# Patient Record
Sex: Female | Born: 1940 | Race: White | Hispanic: No | Smoking: Never smoker
Health system: Southern US, Community
[De-identification: ages and names within clinical notes are randomized; demographics above are authoritative.]

## PROBLEM LIST (undated history)

## (undated) DIAGNOSIS — N19 Unspecified kidney failure: Secondary | ICD-10-CM

## (undated) DIAGNOSIS — G2581 Restless legs syndrome: Secondary | ICD-10-CM

## (undated) DIAGNOSIS — Z820 Family history of epilepsy and other diseases of the nervous system: Secondary | ICD-10-CM

## (undated) DIAGNOSIS — F419 Anxiety disorder, unspecified: Secondary | ICD-10-CM

## (undated) DIAGNOSIS — L932 Other local lupus erythematosus: Secondary | ICD-10-CM

## (undated) DIAGNOSIS — K219 Gastro-esophageal reflux disease without esophagitis: Secondary | ICD-10-CM

## (undated) DIAGNOSIS — F331 Major depressive disorder, recurrent, moderate: Secondary | ICD-10-CM

## (undated) DIAGNOSIS — M810 Age-related osteoporosis without current pathological fracture: Secondary | ICD-10-CM

## (undated) DIAGNOSIS — E079 Disorder of thyroid, unspecified: Secondary | ICD-10-CM

## (undated) HISTORY — DX: Family history of epilepsy and other diseases of the nervous system: Z82.0

## (undated) HISTORY — DX: Gastro-esophageal reflux disease without esophagitis: K21.9

## (undated) HISTORY — DX: Restless legs syndrome: G25.81

## (undated) HISTORY — DX: Other local lupus erythematosus: L93.2

## (undated) HISTORY — PX: EYE SURGERY: SHX253

## (undated) HISTORY — PX: BREAST SURGERY: SHX581

## (undated) HISTORY — DX: Age-related osteoporosis without current pathological fracture: M81.0

## (undated) HISTORY — DX: Anxiety disorder, unspecified: F41.9

## (undated) HISTORY — DX: Disorder of thyroid, unspecified: E07.9

## (undated) HISTORY — DX: Major depressive disorder, recurrent, moderate: F33.1

## (undated) HISTORY — DX: Unspecified kidney failure: N19

---
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
## 1984-08-20 HISTORY — PX: TOTAL ABDOMINAL HYSTERECTOMY: SHX209

## 1999-07-24 ENCOUNTER — Ambulatory Visit (HOSPITAL_COMMUNITY): Admission: RE | Admit: 1999-07-24 | Discharge: 1999-07-24 | Payer: Self-pay | Admitting: Gastroenterology

## 1999-08-29 ENCOUNTER — Encounter: Admission: RE | Admit: 1999-08-29 | Discharge: 1999-08-29 | Payer: Self-pay | Admitting: Obstetrics and Gynecology

## 1999-08-29 ENCOUNTER — Encounter: Payer: Self-pay | Admitting: Obstetrics and Gynecology

## 2000-09-03 ENCOUNTER — Ambulatory Visit (HOSPITAL_COMMUNITY): Admission: RE | Admit: 2000-09-03 | Discharge: 2000-09-03 | Payer: Self-pay | Admitting: Obstetrics and Gynecology

## 2000-09-03 ENCOUNTER — Encounter: Payer: Self-pay | Admitting: Obstetrics and Gynecology

## 2000-11-05 ENCOUNTER — Other Ambulatory Visit: Admission: RE | Admit: 2000-11-05 | Discharge: 2000-11-05 | Payer: Self-pay | Admitting: Obstetrics and Gynecology

## 2001-06-12 ENCOUNTER — Encounter: Admission: RE | Admit: 2001-06-12 | Discharge: 2001-06-12 | Payer: Self-pay | Admitting: Family Medicine

## 2001-06-12 ENCOUNTER — Encounter: Payer: Self-pay | Admitting: Family Medicine

## 2001-07-18 ENCOUNTER — Encounter: Admission: RE | Admit: 2001-07-18 | Discharge: 2001-07-18 | Payer: Self-pay | Admitting: Family Medicine

## 2001-07-18 ENCOUNTER — Encounter: Payer: Self-pay | Admitting: Family Medicine

## 2001-09-05 ENCOUNTER — Encounter: Payer: Self-pay | Admitting: Family Medicine

## 2001-09-05 ENCOUNTER — Ambulatory Visit (HOSPITAL_COMMUNITY): Admission: RE | Admit: 2001-09-05 | Discharge: 2001-09-05 | Payer: Self-pay | Admitting: Family Medicine

## 2002-09-08 ENCOUNTER — Encounter: Payer: Self-pay | Admitting: Obstetrics and Gynecology

## 2002-09-08 ENCOUNTER — Ambulatory Visit (HOSPITAL_COMMUNITY): Admission: RE | Admit: 2002-09-08 | Discharge: 2002-09-08 | Payer: Self-pay | Admitting: Obstetrics and Gynecology

## 2003-04-21 ENCOUNTER — Encounter: Payer: Self-pay | Admitting: Family Medicine

## 2003-04-21 ENCOUNTER — Encounter: Admission: RE | Admit: 2003-04-21 | Discharge: 2003-04-21 | Payer: Self-pay | Admitting: Family Medicine

## 2003-09-21 ENCOUNTER — Ambulatory Visit (HOSPITAL_COMMUNITY): Admission: RE | Admit: 2003-09-21 | Discharge: 2003-09-21 | Payer: Self-pay | Admitting: Family Medicine

## 2003-10-26 ENCOUNTER — Other Ambulatory Visit: Admission: RE | Admit: 2003-10-26 | Discharge: 2003-10-26 | Payer: Self-pay | Admitting: Obstetrics and Gynecology

## 2004-09-21 ENCOUNTER — Ambulatory Visit (HOSPITAL_COMMUNITY): Admission: RE | Admit: 2004-09-21 | Discharge: 2004-09-21 | Payer: Self-pay | Admitting: Obstetrics and Gynecology

## 2005-10-04 ENCOUNTER — Ambulatory Visit (HOSPITAL_COMMUNITY): Admission: RE | Admit: 2005-10-04 | Discharge: 2005-10-04 | Payer: Self-pay | Admitting: Obstetrics and Gynecology

## 2006-10-07 ENCOUNTER — Ambulatory Visit (HOSPITAL_COMMUNITY): Admission: RE | Admit: 2006-10-07 | Discharge: 2006-10-07 | Payer: Self-pay | Admitting: Obstetrics and Gynecology

## 2006-10-27 ENCOUNTER — Encounter: Admission: RE | Admit: 2006-10-27 | Discharge: 2006-10-27 | Payer: Self-pay | Admitting: Family Medicine

## 2006-11-03 ENCOUNTER — Encounter: Admission: RE | Admit: 2006-11-03 | Discharge: 2006-11-03 | Payer: Self-pay

## 2007-10-13 ENCOUNTER — Ambulatory Visit (HOSPITAL_COMMUNITY): Admission: RE | Admit: 2007-10-13 | Discharge: 2007-10-13 | Payer: Self-pay | Admitting: Family Medicine

## 2008-10-13 ENCOUNTER — Ambulatory Visit (HOSPITAL_COMMUNITY): Admission: RE | Admit: 2008-10-13 | Discharge: 2008-10-13 | Payer: Self-pay | Admitting: Obstetrics and Gynecology

## 2009-07-29 ENCOUNTER — Encounter: Admission: RE | Admit: 2009-07-29 | Discharge: 2009-07-29 | Payer: Self-pay | Admitting: Family Medicine

## 2009-10-20 ENCOUNTER — Ambulatory Visit (HOSPITAL_COMMUNITY): Admission: RE | Admit: 2009-10-20 | Discharge: 2009-10-20 | Payer: Self-pay | Admitting: Obstetrics and Gynecology

## 2010-01-18 LAB — HM COLONOSCOPY

## 2010-01-25 LAB — HM COLONOSCOPY

## 2010-09-09 ENCOUNTER — Encounter: Payer: Self-pay | Admitting: Family Medicine

## 2010-09-10 ENCOUNTER — Encounter: Payer: Self-pay | Admitting: Family Medicine

## 2010-09-15 ENCOUNTER — Other Ambulatory Visit: Payer: Self-pay | Admitting: Obstetrics and Gynecology

## 2010-09-15 DIAGNOSIS — Z1239 Encounter for other screening for malignant neoplasm of breast: Secondary | ICD-10-CM

## 2010-09-15 DIAGNOSIS — Z1231 Encounter for screening mammogram for malignant neoplasm of breast: Secondary | ICD-10-CM

## 2010-10-23 ENCOUNTER — Ambulatory Visit (HOSPITAL_COMMUNITY)
Admission: RE | Admit: 2010-10-23 | Discharge: 2010-10-23 | Disposition: A | Discharge status: Home / Self Care | Payer: Medicare Other | Source: Ambulatory Visit | Attending: Obstetrics and Gynecology | Admitting: Obstetrics and Gynecology

## 2010-10-23 DIAGNOSIS — Z1231 Encounter for screening mammogram for malignant neoplasm of breast: Secondary | ICD-10-CM | POA: Insufficient documentation

## 2010-12-18 DIAGNOSIS — E039 Hypothyroidism, unspecified: Secondary | ICD-10-CM | POA: Insufficient documentation

## 2011-12-21 ENCOUNTER — Other Ambulatory Visit (HOSPITAL_COMMUNITY): Payer: Self-pay | Admitting: Family Medicine

## 2011-12-21 DIAGNOSIS — Z1231 Encounter for screening mammogram for malignant neoplasm of breast: Secondary | ICD-10-CM

## 2012-01-16 ENCOUNTER — Ambulatory Visit (HOSPITAL_COMMUNITY): Payer: Medicare Other | Attending: Family Medicine

## 2012-03-18 DIAGNOSIS — G4700 Insomnia, unspecified: Secondary | ICD-10-CM | POA: Insufficient documentation

## 2012-03-28 ENCOUNTER — Ambulatory Visit (HOSPITAL_COMMUNITY): Payer: Medicare Other

## 2012-04-08 ENCOUNTER — Ambulatory Visit (HOSPITAL_COMMUNITY): Payer: Medicare Other

## 2012-04-15 ENCOUNTER — Ambulatory Visit (HOSPITAL_COMMUNITY)
Admission: RE | Admit: 2012-04-15 | Discharge: 2012-04-15 | Disposition: A | Discharge status: Home / Self Care | Payer: Medicare Other | Source: Ambulatory Visit | Attending: Family Medicine | Admitting: Family Medicine

## 2012-04-15 DIAGNOSIS — Z1231 Encounter for screening mammogram for malignant neoplasm of breast: Secondary | ICD-10-CM

## 2012-04-17 ENCOUNTER — Other Ambulatory Visit: Payer: Self-pay | Admitting: Family Medicine

## 2012-04-17 DIAGNOSIS — R928 Other abnormal and inconclusive findings on diagnostic imaging of breast: Secondary | ICD-10-CM

## 2012-04-17 DIAGNOSIS — N644 Mastodynia: Secondary | ICD-10-CM

## 2012-04-24 ENCOUNTER — Ambulatory Visit
Admission: RE | Admit: 2012-04-24 | Discharge: 2012-04-24 | Disposition: A | Discharge status: Home / Self Care | Payer: Medicare Other | Source: Ambulatory Visit | Attending: Family Medicine | Admitting: Family Medicine

## 2012-04-24 ENCOUNTER — Other Ambulatory Visit: Payer: Self-pay | Admitting: Family Medicine

## 2012-04-24 DIAGNOSIS — N644 Mastodynia: Secondary | ICD-10-CM

## 2012-07-30 DIAGNOSIS — M19049 Primary osteoarthritis, unspecified hand: Secondary | ICD-10-CM | POA: Insufficient documentation

## 2013-01-30 DIAGNOSIS — L209 Atopic dermatitis, unspecified: Secondary | ICD-10-CM | POA: Insufficient documentation

## 2013-01-30 DIAGNOSIS — B9562 Methicillin resistant Staphylococcus aureus infection as the cause of diseases classified elsewhere: Secondary | ICD-10-CM | POA: Insufficient documentation

## 2013-01-30 DIAGNOSIS — N951 Menopausal and female climacteric states: Secondary | ICD-10-CM | POA: Insufficient documentation

## 2013-04-07 ENCOUNTER — Encounter: Payer: Self-pay | Admitting: Obstetrics and Gynecology

## 2013-04-07 ENCOUNTER — Ambulatory Visit: Payer: Self-pay | Admitting: Obstetrics and Gynecology

## 2013-04-10 ENCOUNTER — Other Ambulatory Visit: Payer: Self-pay | Admitting: Obstetrics and Gynecology

## 2013-04-10 DIAGNOSIS — Z1231 Encounter for screening mammogram for malignant neoplasm of breast: Secondary | ICD-10-CM

## 2013-04-22 ENCOUNTER — Ambulatory Visit: Payer: Self-pay | Admitting: Obstetrics and Gynecology

## 2013-04-27 ENCOUNTER — Ambulatory Visit (HOSPITAL_COMMUNITY)
Admission: RE | Admit: 2013-04-27 | Discharge: 2013-04-27 | Disposition: A | Discharge status: Home / Self Care | Payer: Medicare Other | Source: Ambulatory Visit | Attending: Obstetrics and Gynecology | Admitting: Obstetrics and Gynecology

## 2013-04-27 DIAGNOSIS — Z1231 Encounter for screening mammogram for malignant neoplasm of breast: Secondary | ICD-10-CM | POA: Insufficient documentation

## 2013-05-01 ENCOUNTER — Ambulatory Visit: Payer: Self-pay | Admitting: Obstetrics and Gynecology

## 2013-05-01 ENCOUNTER — Encounter: Payer: Self-pay | Admitting: Obstetrics and Gynecology

## 2013-05-08 ENCOUNTER — Ambulatory Visit: Payer: Self-pay | Admitting: Obstetrics and Gynecology

## 2013-05-08 ENCOUNTER — Encounter: Payer: Self-pay | Admitting: Obstetrics and Gynecology

## 2013-05-12 ENCOUNTER — Telehealth: Payer: Self-pay | Admitting: Obstetrics and Gynecology

## 2013-05-12 NOTE — Telephone Encounter (Signed)
Hi Jessica, Please waived one of the dnka fee's for this patient, she will pay the other one at next visit. Thanks, Mervin Kung

## 2013-06-17 ENCOUNTER — Ambulatory Visit: Payer: Self-pay | Admitting: Obstetrics and Gynecology

## 2013-06-19 ENCOUNTER — Ambulatory Visit (INDEPENDENT_AMBULATORY_CARE_PROVIDER_SITE_OTHER): Payer: Medicare Other | Admitting: Obstetrics and Gynecology

## 2013-06-19 ENCOUNTER — Encounter: Payer: Self-pay | Admitting: Obstetrics and Gynecology

## 2013-06-19 VITALS — BP 112/64 | HR 72 | Resp 16 | Ht 63.25 in | Wt 129.0 lb

## 2013-06-19 DIAGNOSIS — Z733 Stress, not elsewhere classified: Secondary | ICD-10-CM

## 2013-06-19 DIAGNOSIS — M8580 Other specified disorders of bone density and structure, unspecified site: Secondary | ICD-10-CM

## 2013-06-19 DIAGNOSIS — Z124 Encounter for screening for malignant neoplasm of cervix: Secondary | ICD-10-CM

## 2013-06-19 DIAGNOSIS — Z23 Encounter for immunization: Secondary | ICD-10-CM

## 2013-06-19 DIAGNOSIS — Z01419 Encounter for gynecological examination (general) (routine) without abnormal findings: Secondary | ICD-10-CM

## 2013-06-19 DIAGNOSIS — F439 Reaction to severe stress, unspecified: Secondary | ICD-10-CM

## 2013-06-19 DIAGNOSIS — M899 Disorder of bone, unspecified: Secondary | ICD-10-CM

## 2013-06-19 MED ORDER — SERTRALINE HCL 100 MG PO TABS: 100.0000 mg | ORAL_TABLET | Freq: Every day | ORAL | Status: DC

## 2013-06-19 MED ORDER — ALPRAZOLAM 0.25 MG PO TABS: 0.2500 mg | ORAL_TABLET | Freq: Every evening | ORAL | Status: DC | PRN

## 2013-06-19 NOTE — Progress Notes (Signed)
GYNECOLOGY VISIT  PCP: Dr. Mardelle Matte ([facility]) [address]  Referring provider:   HPI: 72 y.o.   Married  Caucasian  female   G1P1 with No LMP recorded. Patient has had a hysterectomy.  TAH/BSO. here for   Annual Exam Stressed due to husband's illness. Not sleeping.  Eating OK.  On Zoloft 50 mg.  Never increased to 100 mg.  It made her tired when she did a trial of this.  Would like to increase to 100 mg now and take it at night.  Ran out of Xanax.  Having a very stressful week.  Has an appointment with her PCP in 3 days and will discuss regular refills of Xanax with him.   Not taking any hormone therapy at all.  Ran out.  Was not taking it daily prior to this.  No significant hot flashes or night sweats. Patient's only question regarding this is the condition of her dermatitis and whether or not the estrogen therapy would improve this.    ROS - Excessive bruising, rash, and itching.  Has dermatitis.  Treated at [facility].   Hgb:  PCP Urine:  PCP  GYNECOLOGIC HISTORY: No LMP recorded. Patient has had a hysterectomy. Sexually active:  No  Partner preference:Female  Contraception: TAH   Menopausal hormone therapy: none currently. DES exposure:   no Blood transfusions:   no Sexually transmitted diseases:   no GYN Procedures:  no Mammogram:  04/2013 normal              Pap:   2005 neg History of abnormal pap smear:  Not sure   OB History   Grav Para Term Preterm Abortions TAB SAB Ect Mult Living   1 1        1        LIFESTYLE: Exercise:no               Tobacco: no Alcohol: 3-4 drinks a week (alcohol) Drug use:  no  OTHER HEALTH MAINTENANCE: Tetanus/TDap: not sure when Gardisil:no Influenza:  no Zostavax: no  Bone density: osteopenia of hip and spine in 2010 - T score hip - 1.3, T score spine -1.1.  Took Boniva in the past.  Colonoscopy: 2010 repeat in 5 years  Cholesterol check: not sure when  Family History  Problem Relation Age of Onset  . Lupus Mother   . Asthma  Mother   . Parkinson's disease Mother   . Heart disease Father     There are no active problems to display for this patient.  Past Medical History  Diagnosis Date  . 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 Thyroid disease     Hypothyroid  . Anxiety   . Kidney failure     blood transfusion  . Osteoporosis     Past Surgical History  Procedure Laterality Date  . Total abdominal hysterectomy  1986    BSO/Fibroids  . Breast surgery Bilateral 1987 1998    Lumpectomy  . Appendectomy  1961    ALLERGIES: Review of patient's allergies indicates no known allergies.  Current Outpatient Prescriptions  Medication Sig Dispense Refill  . levothyroxine (SYNTHROID, LEVOTHROID) 50 MCG tablet Take 50 mcg by mouth daily before breakfast.      . methotrexate 2.5 MG tablet Take by mouth once a week.      . sertraline (ZOLOFT) 100 MG tablet Take 100 mg by mouth daily.      Marland Kitchen conjugated  estrogens (PREMARIN) vaginal cream Place 0.5 g vaginally 2 (two) times a week.      . Est Estrogens-Methyltest (ESTRATEST PO) Take by mouth daily.       No current facility-administered medications for this visit.     ROS:  Pertinent items are noted in HPI.  SOCIAL HISTORY:  Married. Retired.   Husband has stage IV renal cancer.   PHYSICAL EXAMINATION:    BP 112/64  Pulse 72  Resp 16  Ht 5' 3.25" (1.607 m)  Wt 129 lb (58.514 kg)  BMI 22.66 kg/m2   Wt Readings from Last 3 Encounters:  06/19/13 129 lb (58.514 kg)     Ht Readings from Last 3 Encounters:  06/19/13 5' 3.25" (1.607 m)    General appearance: alert, cooperative and appears stated age, tearful when discusses her husband's health.  Head: Normocephalic, without obvious abnormality, atraumatic Neck: no adenopathy, supple, symmetrical, trachea midline and thyroid not enlarged, symmetric, no tenderness/mass/nodules Lungs: clear to auscultation bilaterally Breasts: Bilateral breast scars, No nipple  retraction or dimpling, No nipple discharge or bleeding, No axillary or supraclavicular adenopathy, Normal to palpation without dominant masses Heart: regular rate and rhythm Abdomen: Pfannenstiel incision and small midline transverse incision below this, soft, non-tender; no masses,  no organomegaly Extremities: extremities normal, atraumatic, no cyanosis or edema Skin: Skin color, texture, turgor normal. No rashes or lesions Lymph nodes: Cervical, supraclavicular, and axillary nodes normal. No abnormal inguinal nodes palpated Neurologic: Grossly normal  Pelvic: External genitalia:  no lesions              Urethra:  normal appearing urethra with no masses, tenderness or lesions              Bartholins and Skenes: normal                 Vagina: normal appearing vagina with normal color and discharge, no lesions              Cervix:  absent              Pap and high risk HPV testing done: no.            Bimanual Exam:  Uterus:   absent                                      Adnexa: normal adnexa in size, nontender and no masses                                      Rectovaginal: Confirms                                      Anus:  normal sphincter tone, no lesions  ASSESSMENT  Normal gynecologic exam. Status post TVH. Doing well off HRT so far.   Situational stress and anxiety.  Cutaneous lupus.   PLAN  Mammogram yearly.  Pap smear and high risk HPV testing not indicated.  Increase Zoloft to 100 mg and take at night.  Small prescription for Xanax .25 md po q hs prn insomnia.  Patient will get refills from her PCP. Counseled on  Osteoporosis prevention. Bone density due.   I encouraged patient to discuss ERT with her physicians  at [facility] who are caring for her dermatitis.  If they believe there is benefit in patient taking ERT again, she will call back.  TDap today.   Return annually or prn   An After Visit Summary was printed and given to the patient.

## 2013-06-19 NOTE — Progress Notes (Signed)
Dexa screening scheduled at [facility] with patient at office visit for 11/4 at 1200. Patient agreeable to time/date/location.

## 2013-06-19 NOTE — Patient Instructions (Signed)
EXERCISE AND DIET:  We recommended that you start or continue a regular exercise program for good health. Regular exercise means any activity that makes your heart beat faster and makes you sweat.  We recommend exercising at least 30 minutes per day at least 3 days a week, preferably 4 or 5.  We also recommend a diet low in fat and sugar.  Inactivity, poor dietary choices and obesity can cause diabetes, heart attack, stroke, and kidney damage, among others.    ALCOHOL AND SMOKING:  Women should limit their alcohol intake to no more than 7 drinks/beers/glasses of wine (combined, not each!) per week. Moderation of alcohol intake to this level decreases your risk of breast cancer and liver damage. And of course, no recreational drugs are part of a healthy lifestyle.  And absolutely no smoking or even second hand smoke. Most people know smoking can cause heart and lung diseases, but did you know it also contributes to weakening of your bones? Aging of your skin?  Yellowing of your teeth and nails?  CALCIUM AND VITAMIN D:  Adequate intake of calcium and Vitamin D are recommended.  The recommendations for exact amounts of these supplements seem to change often, but generally speaking 600 mg of calcium (either carbonate or citrate) and 800 units of Vitamin D per day seems prudent. Certain women may benefit from higher intake of Vitamin D.  If you are among these women, your doctor will have told you during your visit.    PAP SMEARS:  Pap smears, to check for cervical cancer or precancers,  have traditionally been done yearly, although recent scientific advances have shown that most women can have pap smears less often.  However, every woman still should have a physical exam from her gynecologist every year. It will include a breast check, inspection of the vulva and vagina to check for abnormal growths or skin changes, a visual exam of the cervix, and then an exam to evaluate the size and shape of the uterus and  ovaries.  And after 71 years of age, a rectal exam is indicated to check for rectal cancers. We will also provide age appropriate advice regarding health maintenance, like when you should have certain vaccines, screening for sexually transmitted diseases, bone density testing, colonoscopy, mammograms, etc.   MAMMOGRAMS:  All women over 40 years old should have a yearly mammogram. Many facilities now offer a "3D" mammogram, which may cost around $50 extra out of pocket. If possible,  we recommend you accept the option to have the 3D mammogram performed.  It both reduces the number of women who will be called back for extra views which then turn out to be normal, and it is better than the routine mammogram at detecting truly abnormal areas.    COLONOSCOPY:  Colonoscopy to screen for colon cancer is recommended for all women at age 50.  We know, you hate the idea of the prep.  We agree, BUT, having colon cancer and not knowing it is worse!!  Colon cancer so often starts as a polyp that can be seen and removed at colonscopy, which can quite literally save your life!  And if your first colonoscopy is normal and you have no family history of colon cancer, most women don't have to have it again for 10 years.  Once every ten years, you can do something that may end up saving your life, right?  We will be happy to help you get it scheduled when you are ready.    Be sure to check your insurance coverage so you understand how much it will cost.  It may be covered as a preventative service at no cost, but you should check your particular policy.    Tetanus, Diphtheria, Pertussis (Tdap) Vaccine What You Need to Know WHY GET VACCINATED? Tetanus, diphtheria and pertussis can be very serious diseases, even for adolescents and adults. Tdap vaccine can protect us from these diseases. TETANUS (Lockjaw) causes painful muscle tightening and stiffness, usually all over the body.  It can lead to tightening of muscles in the head  and neck so you can't open your mouth, swallow, or sometimes even breathe. Tetanus kills about 1 out of 5 people who are infected. DIPHTHERIA can cause a thick coating to form in the back of the throat.  It can lead to breathing problems, paralysis, heart failure, and death. PERTUSSIS (Whooping Cough) causes severe coughing spells, which can cause difficulty breathing, vomiting and disturbed sleep.  It can also lead to weight loss, incontinence, and rib fractures. Up to 2 in 100 adolescents and 5 in 100 adults with pertussis are hospitalized or have complications, which could include pneumonia and death. These diseases are caused by bacteria. Diphtheria and pertussis are spread from person to person through coughing or sneezing. Tetanus enters the body through cuts, scratches, or wounds. Before vaccines, the [place] saw as many as 200,000 cases a year of diphtheria and pertussis, and hundreds of cases of tetanus. Since vaccination began, tetanus and diphtheria have dropped by about 99% and pertussis by about 80%. TDAP VACCINE Tdap vaccine can protect adolescents and adults from tetanus, diphtheria, and pertussis. One dose of Tdap is routinely given at age 11 or 12. People who did not get Tdap at that age should get it as soon as possible. Tdap is especially important for health care professionals and anyone having close contact with a baby younger than 12 months. Pregnant women should get a dose of Tdap during every pregnancy, to protect the newborn from pertussis. Infants are most at risk for severe, life-threatening complications from pertussis. A similar vaccine, called Td, protects from tetanus and diphtheria, but not pertussis. A Td booster should be given every 10 years. Tdap may be given as one of these boosters if you have not already gotten a dose. Tdap may also be given after a severe cut or burn to prevent tetanus infection. Your doctor can give you more information. Tdap may safely  be given at the same time as other vaccines. SOME PEOPLE SHOULD NOT GET THIS VACCINE  If you ever had a life-threatening allergic reaction after a dose of any tetanus, diphtheria, or pertussis containing vaccine, OR if you have a severe allergy to any part of this vaccine, you should not get Tdap. Tell your doctor if you have any severe allergies.  If you had a coma, or long or multiple seizures within 7 days after a childhood dose of DTP or DTaP, you should not get Tdap, unless a cause other than the vaccine was found. You can still get Td.  Talk to your doctor if you:  have epilepsy or another nervous system problem,  had severe pain or swelling after any vaccine containing diphtheria, tetanus or pertussis,  ever had Guillain-Barr Syndrome (GBS),  aren't feeling well on the day the shot is scheduled. RISKS OF A VACCINE REACTION With any medicine, including vaccines, there is a chance of side effects. These are usually mild and go away on their own, but serious   reactions are also possible. Brief fainting spells can follow a vaccination, leading to injuries from falling. Sitting or lying down for about 15 minutes can help prevent these. Tell your doctor if you feel dizzy or light-headed, or have vision changes or ringing in the ears. Mild problems following Tdap (Did not interfere with activities)  Pain where the shot was given (about 3 in 4 adolescents or 2 in 3 adults)  Redness or swelling where the shot was given (about 1 person in 5)  Mild fever of at least 100.4F (up to about 1 in 25 adolescents or 1 in 100 adults)  Headache (about 3 or 4 people in 10)  Tiredness (about 1 person in 3 or 4)  Nausea, vomiting, diarrhea, stomach ache (up to 1 in 4 adolescents or 1 in 10 adults)  Chills, body aches, sore joints, rash, swollen glands (uncommon) Moderate problems following Tdap (Interfered with activities, but did not require medical attention)  Pain where the shot was given  (about 1 in 5 adolescents or 1 in 100 adults)  Redness or swelling where the shot was given (up to about 1 in 16 adolescents or 1 in 25 adults)  Fever over 102F (about 1 in 100 adolescents or 1 in 250 adults)  Headache (about 3 in 20 adolescents or 1 in 10 adults)  Nausea, vomiting, diarrhea, stomach ache (up to 1 or 3 people in 100)  Swelling of the entire arm where the shot was given (up to about 3 in 100). Severe problems following Tdap (Unable to perform usual activities, required medical attention)  Swelling, severe pain, bleeding and redness in the arm where the shot was given (rare). A severe allergic reaction could occur after any vaccine (estimated less than 1 in a million doses). WHAT IF THERE IS A SERIOUS REACTION? What should I look for?  Look for anything that concerns you, such as signs of a severe allergic reaction, very high fever, or behavior changes. Signs of a severe allergic reaction can include hives, swelling of the face and throat, difficulty breathing, a fast heartbeat, dizziness, and weakness. These would start a few minutes to a few hours after the vaccination. What should I do?  If you think it is a severe allergic reaction or other emergency that can't wait, call 9-1-1 or get the person to the nearest hospital. Otherwise, call your doctor.  Afterward, the reaction should be reported to the "Vaccine Adverse Event Reporting System" (VAERS). Your doctor might file this report, or you can do it yourself through the VAERS web site at www.vaers.hhs.gov, or by calling 1-800-822-7967. VAERS is only for reporting reactions. They do not give medical advice.  THE NATIONAL VACCINE INJURY COMPENSATION PROGRAM The National Vaccine Injury Compensation Program (VICP) is a federal program that was created to compensate people who may have been injured by certain vaccines. Persons who believe they may have been injured by a vaccine can learn about the program and about filing a  claim by calling 1-800-338-2382 or visiting the VICP website at www.hrsa.gov/vaccinecompensation. HOW CAN I LEARN MORE?  Ask your doctor.  Call your local or state health department.  Contact the Centers for Disease Control and Prevention (CDC):  Call 1-800-232-4636 or visit CDC's website at www.cdc.gov/vaccines. CDC Tdap Vaccine VIS (12/27/11) Document Released: 02/05/2012 Document Revised: 04/30/2012 Document Reviewed: 02/05/2012 Exit[employer] Patient Information 2014 [employer].  

## 2013-06-22 DIAGNOSIS — F331 Major depressive disorder, recurrent, moderate: Secondary | ICD-10-CM | POA: Insufficient documentation

## 2013-07-13 ENCOUNTER — Telehealth: Payer: Self-pay | Admitting: Emergency Medicine

## 2013-07-13 DIAGNOSIS — M8580 Other specified disorders of bone density and structure, unspecified site: Secondary | ICD-10-CM | POA: Insufficient documentation

## 2013-07-13 DIAGNOSIS — M818 Other osteoporosis without current pathological fracture: Secondary | ICD-10-CM | POA: Insufficient documentation

## 2013-07-13 HISTORY — DX: Other specified disorders of bone density and structure, unspecified site: M85.80

## 2013-07-13 NOTE — Telephone Encounter (Signed)
Dexa results per Dr. Edward Jolly,   Mild Osteopenia. Continue vit D, Calcium and weight bearing exercise. Next BMD 2 years.   Spoke with patient and message from Dr. Edward Jolly given. Will follow up prn.

## 2014-03-05 ENCOUNTER — Telehealth: Payer: Self-pay | Admitting: Obstetrics and Gynecology

## 2014-03-05 NOTE — Telephone Encounter (Signed)
Message left to return call to Cheryl Austin at [phone].    

## 2014-03-05 NOTE — Telephone Encounter (Signed)
Pt is having hot flashes and thinks she may also have a bladder infection.

## 2014-03-08 NOTE — Telephone Encounter (Signed)
Message left to return call to Alphonso Gregson at [phone].    

## 2014-03-08 NOTE — Telephone Encounter (Signed)
Spoke with patient. She states that last week she had some urinary frequency but that has since resolved. No fevers, abdominal pain or flank pain.  She states that she is having hot flashes again and would like to discuss medication and lab testing with Dr. Edward JollySilva.  Patient scheduled for office visit to discuss hot flashes on 03/17/14 at 1200. Patient advised to call back to our office if urinary frequency returns for office visit.  Routing to provider for final review. Patient agreeable to disposition. Will close encounter

## 2014-03-17 ENCOUNTER — Encounter: Payer: Self-pay | Admitting: Obstetrics and Gynecology

## 2014-03-17 ENCOUNTER — Ambulatory Visit (INDEPENDENT_AMBULATORY_CARE_PROVIDER_SITE_OTHER): Payer: Medicare Other | Admitting: Obstetrics and Gynecology

## 2014-03-17 VITALS — BP 104/64 | HR 60 | Ht 63.25 in | Wt 130.0 lb

## 2014-03-17 DIAGNOSIS — E039 Hypothyroidism, unspecified: Secondary | ICD-10-CM

## 2014-03-17 DIAGNOSIS — N952 Postmenopausal atrophic vaginitis: Secondary | ICD-10-CM

## 2014-03-17 DIAGNOSIS — R350 Frequency of micturition: Secondary | ICD-10-CM

## 2014-03-17 DIAGNOSIS — N951 Menopausal and female climacteric states: Secondary | ICD-10-CM

## 2014-03-17 LAB — POCT URINALYSIS DIPSTICK
Bilirubin, UA: NEGATIVE
Glucose, UA: NEGATIVE
Ketones, UA: NEGATIVE
LEUKOCYTES UA: NEGATIVE
Nitrite, UA: NEGATIVE
PH UA: 7
Protein, UA: NEGATIVE
RBC UA: NEGATIVE
UROBILINOGEN UA: NEGATIVE

## 2014-03-17 MED ORDER — ESTROGENS, CONJUGATED 0.625 MG/GM VA CREA: 1.0000 | TOPICAL_CREAM | Freq: Every day | VAGINAL | Status: DC

## 2014-03-17 NOTE — Progress Notes (Signed)
GYNECOLOGY VISIT  PCP:   Referring provider:   HPI: 73 y.o.   Married  Caucasian  female   G1P1 with No LMP recorded. Patient has had a hysterectomy.   here for   Discussion on hot flashes and Urinary isses.  Uncertain if having a bladder or urinary infection No vaginal odor or discharge.  Having some itching on her bottom.  Using a new soap.    Some increased hot flashes over the last 3 -4 months.  Short lived. Stopped ERT one year ago.  Took it for 20 years.   Leakage of urine at night  Cannot get to the bathroom on time for the last month.  Does not happen during the day.  Taking Trazadone.  3 cups coffee per day in the am.  Cranberry pill - started a week ago.  Drinks whiskey at night.   Wants to test thyroid today.  PCP usually manages this.   Stressed due to husband's illness.  Urine:  Normal, ph 7.0  GYNECOLOGIC HISTORY: No LMP recorded. Patient has had a hysterectomy. Sexually active: no  Partner preference: Female Contraception:   TAH Menopausal hormone therapy: None DES exposure:   no Blood transfusions:   no Sexually transmitted diseases:   no GYN procedures and prior surgeries:  no Last mammogram:  04/28/13 BI-RADS Neg               Last pap and high risk HPV testing:  2005, neg  History of abnormal pap smear:  Not sure   OB History   Grav Para Term Preterm Abortions TAB SAB Ect Mult Living   1 1        1        LIFESTYLE: Exercise:               Tobacco:  Alcohol: Drug use:    There are no active problems to display for this patient.   Past Medical History  Diagnosis Date  . 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 Thyroid disease     Hypothyroid  . Anxiety   . Kidney failure     blood transfusion  . Osteoporosis     Past Surgical History  Procedure Laterality Date  . Total abdominal hysterectomy  1986    BSO/Fibroids  . Breast surgery Bilateral 1987 1998    Lumpectomy  .  Appendectomy  1961    Current Outpatient Prescriptions  Medication Sig Dispense Refill  . ALPRAZolam (XANAX) 0.25 MG tablet Take 1 tablet (0.25 mg total) by mouth at bedtime as needed for sleep.  5 tablet  0  . clonazePAM (KLONOPIN) 1 MG tablet Take 1 mg by mouth as needed for anxiety.      Marland Kitchen. levothyroxine (SYNTHROID, LEVOTHROID) 50 MCG tablet Take 50 mcg by mouth daily before breakfast.      . methotrexate 2.5 MG tablet Take by mouth once a week.      . traZODone (DESYREL) 100 MG tablet Take 100 mg by mouth as needed for sleep.      Marland Kitchen. sertraline (ZOLOFT) 100 MG tablet Take 1 tablet (100 mg total) by mouth daily.  90 tablet  3   No current facility-administered medications for this visit.     ALLERGIES: Review of patient's allergies indicates no known allergies.  Family History  Problem Relation Age of Onset  . Lupus Mother   . Asthma Mother   . Parkinson's disease Mother   .  Heart disease Father     History   Social History  . Marital Status: Married    Spouse Name: N/A    Number of Children: N/A  . Years of Education: N/A   Occupational History  . Not on file.   Social History Main Topics  . Smoking status: Never Smoker   . Smokeless tobacco: Never Used  . Alcohol Use: 1.5 - 2.0 oz/week    3-4 drink(s) per week     Comment: 3-4 drinks a week (alcohol)  . Drug Use: No  . Sexual Activity: No   Other Topics Concern  . Not on file   Social History Narrative  . No narrative on file    ROS:  Pertinent items are noted in HPI.  PHYSICAL EXAMINATION:    BP 104/64  Pulse 60  Ht 5' 3.25" (1.607 m)  Wt 130 lb (58.968 kg)  BMI 22.83 kg/m2   Wt Readings from Last 3 Encounters:  03/17/14 130 lb (58.968 kg)  06/19/13 129 lb (58.514 kg)     Ht Readings from Last 3 Encounters:  03/17/14 5' 3.25" (1.607 m)  06/19/13 5' 3.25" (1.607 m)    General appearance: alert, cooperative and appears stated age   Pelvic: External genitalia:  no lesions              Urethra:   normal appearing urethra with no masses, tenderness or lesions              Bartholins and Skenes: normal                 Vagina: normal appearing vagina with normal color and discharge, no lesions              Cervix: absent                 Bimanual Exam:  Uterus:  absent                                      Adnexa: normal adnexa in size, nontender and no masses                                        Wet prep - pH 5.5, negative for yeast, clue cells, trichomonas.  ASSESSMENT  Urinary frequency.  Normal urine dip.  Atrophic vaginitis.  Menopausal symptoms.  Situational stress.  Hypothyroidism.   PLAN  Premarin vaginal cream.  See Epic orders.  Discussed risks of breast cancers and MI, stroke, DVT, PE. Will send urine culture.  Discussed bladder irritants.  Discussed herbal treatments for menopause.  Check TFTs.  Support given.  Discussed adding resources to her daily life to assist with chores at house.  An After Visit Summary was printed and given to the patient.  25 minutes face to face time of which over 50% was spent in counseling.

## 2014-03-17 NOTE — Patient Instructions (Signed)
Menopause and Herbal Products Menopause is the normal time of life when menstrual periods stop completely. Menopause is complete when you have missed 12 consecutive menstrual periods. It usually occurs between the ages of 48 to 55, with an average age of 51. Very rarely does a woman develop menopause before 72 years old. At menopause, your ovaries stop producing the female hormones, estrogen and progesterone. This can cause undesirable symptoms and also affect your health. Sometimes the symptoms can occur 4 to 5 years before the menopause begins. There is no relationship between menopause and:  Oral contraceptives.  Number of children you had.  Race.  The age your menstrual periods started (menarche). Heavy smokers and very thin women may develop menopause earlier in life. Estrogen and progesterone hormone treatment is the usual method of treating menopausal symptoms. However, there are women who should not take hormone treatment. This is true of:   Women that have breast or uterine cancer.  Women who prefer not to take hormones because of certain side effects (abnormal uterine bleeding).  Women who are afraid that hormones may cause breast cancer.  Women who have a history of liver disease, heart disease, stroke, or blood clots. For these women, there are other medications that may help treat their menopausal symptoms. These medications are found in plants and botanical products. They can be found in the form of herbs, teas, oils, tinctures, and pills.  CAUSES:  The ovaries stop producing the female hormones estrogen and progesterone.  Other causes include:  Surgery to remove both ovaries.  The ovaries stop functioning for no know reason.  Tumors of the pituitary gland in the brain.  Medical disease that affects the ovaries and hormone production.  Radiation treatment to the abdomen or pelvis.  Chemotherapy that affects the ovaries. PHYTOESTROGENS: Phytoestrogens occur  naturally in plants and plant products. They act like estrogen in the body. Herbal medications are made from these plants and botanical steroids. There are 3 types of phytoestrogens:  Isoflavones (genistein and daidzein) are found in soy, garbanzo beans, miso and tofu foods.  Ligins are found in the shell of seeds. They are used to make oils like flaxseed oil. The bacteria in your intestine act on these foods to produce the estrogen-like hormones.  Coumestans are estrogen-like. Some of the foods they are found in include sunflower seeds and bean sprouts. CONDITIONS AND THEIR POSSIBLE HERBAL TREATMENT:  Hot flashes and night sweats.  Soy, black cohosh and evening primrose.  Irritability, insomnia, depression and memory problems.  Chasteberry, ginseng, and soy.  St. John's wort may be helpful for depression. However, there is a concern of it causing cataracts of the eye and may have bad effects on other medications. St. John's wort should not be taken for long time and without your caregiver's advice.  Loss of libido and vaginal and skin dryness.  Wild yam and soy.  Prevention of coronary heart disease and osteoporosis.  Soy and Isoflavones. Several studies have shown that some women benefit from herbal medications, but most of the studies have not consistently shown that these supplements are much better than placebo. Other forms of treatment to help women with menopausal symptoms include a balanced diet, rest, exercise, vitamin and calcium (with vitamin D) supplements, acupuncture, and group therapy when necessary. THOSE WHO SHOULD NOT TAKE HERBAL MEDICATIONS INCLUDE:  Women who are planning on getting pregnant unless told by your caregiver.  Women who are breastfeeding unless told by your caregiver.  Women who are taking other   prescription medications unless told by your caregiver.  Infants, children, and elderly women unless told by your caregiver. Different herbal medications  have different and unmeasured amounts of the herbal ingredients. There are no regulations, quality control, and standardization of the ingredients in herbal medications. Therefore, the amount of the ingredient in the medication may vary from one herb, pill, tea, oil or tincture to another. Many herbal medications can cause serious problems and can even have poisonous effects if taken too much or too long. If problems develop, the medication should be stopped and recorded by your caregiver. HOME CARE INSTRUCTIONS  Do not take or give children herbal medications without your caregiver's advice.  Let your caregiver know all the medications you are taking. This includes prescription, over-the-counter, eye drops, and creams.  Do not take herbal medications longer or more than recommended.  Tell your caregiver about any side effects from the medication. SEEK MEDICAL CARE IF:  You develop a fever of 102 F (38.9 C), or as directed by your caregiver.  You feel sick to your stomach (nauseous), vomit, or have diarrhea.  You develop a rash.  You develop abdominal pain.  You develop severe headaches.  You start to have vision problems.  You feel dizzy or faint.  You start to feel numbness in any part of your body.  You start shaking (have convulsions). Document Released: 01/23/2008 Document Revised: 07/23/2012 Document Reviewed: 08/22/2010 Exit[employer] Patient Information 2015 [employer]. This information is not intended to replace advice given to you by your health care provider. Make sure you discuss any questions you have with your health care provider.  

## 2014-03-18 LAB — THYROID PANEL WITH TSH
Free Thyroxine Index: 3.3 (ref 1.0–3.9)
T3 Uptake: 39.1 % — ABNORMAL HIGH (ref 22.5–37.0)
T4, Total: 8.5 ug/dL (ref 5.0–12.5)
TSH: 0.544 u[IU]/mL (ref 0.350–4.500)

## 2014-03-18 LAB — CULTURE, URINE COMPREHENSIVE
Colony Count: NO GROWTH
Organism ID, Bacteria: NO GROWTH

## 2014-06-02 ENCOUNTER — Other Ambulatory Visit: Payer: Self-pay | Admitting: Obstetrics and Gynecology

## 2014-06-02 DIAGNOSIS — Z1231 Encounter for screening mammogram for malignant neoplasm of breast: Secondary | ICD-10-CM

## 2014-06-09 ENCOUNTER — Ambulatory Visit (HOSPITAL_COMMUNITY)
Admission: RE | Admit: 2014-06-09 | Discharge: 2014-06-09 | Disposition: A | Discharge status: Home / Self Care | Payer: Medicare Other | Source: Ambulatory Visit | Attending: Obstetrics and Gynecology | Admitting: Obstetrics and Gynecology

## 2014-06-09 DIAGNOSIS — Z1231 Encounter for screening mammogram for malignant neoplasm of breast: Secondary | ICD-10-CM | POA: Diagnosis present

## 2014-06-21 ENCOUNTER — Encounter: Payer: Self-pay | Admitting: Obstetrics and Gynecology

## 2014-11-02 DIAGNOSIS — L219 Seborrheic dermatitis, unspecified: Secondary | ICD-10-CM | POA: Insufficient documentation

## 2015-07-28 LAB — HM DEXA SCAN

## 2015-07-28 LAB — HM MAMMOGRAPHY

## 2015-08-05 LAB — HM MAMMOGRAPHY

## 2015-12-30 ENCOUNTER — Encounter: Payer: Self-pay | Admitting: Family Medicine

## 2016-05-21 DIAGNOSIS — Z820 Family history of epilepsy and other diseases of the nervous system: Secondary | ICD-10-CM | POA: Insufficient documentation

## 2016-05-21 HISTORY — DX: Family history of epilepsy and other diseases of the nervous system: Z82.0

## 2016-06-11 ENCOUNTER — Encounter: Payer: Self-pay | Admitting: Neurology

## 2016-06-11 ENCOUNTER — Ambulatory Visit (INDEPENDENT_AMBULATORY_CARE_PROVIDER_SITE_OTHER): Payer: Medicare Other | Admitting: Neurology

## 2016-06-11 VITALS — BP 116/70 | HR 88 | Resp 14 | Ht 63.25 in | Wt 113.0 lb

## 2016-06-11 DIAGNOSIS — R251 Tremor, unspecified: Secondary | ICD-10-CM | POA: Diagnosis not present

## 2016-06-11 NOTE — Progress Notes (Signed)
Subjective:    Patient ID: Cheryl Austin is a 75 y.o. female.  HPI     Huston Foley, MD, PhD [facility] [address] [address] [place], [place] [address]  Dear Dr. Mardelle Matte,   I saw your patient, Cheryl Austin, upon your kind request in my neurologic clinic today for initial consultation of her tremor, with a family history of Parkinson's disease. The patient is accompanied by her cousin today. 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 She has a FX of PD in her mother who lived to be 74 yo.  She has not fallen. She has had recent stressors, including her husband's passing a little over a year ago and recently losing her only daughter to a tragic death.  She has had trouble with fine motor skills, but in the last few weeks, symptoms may have improved mildly. She has 3 brothers, none of whom have tremor issues. She does not typically drink alcohol on a regular basis, caffeine in the form of coffee, 2 cups per day on average, maybe 3 cups, typically no exacerbation noted. She is retired. She lives alone. She has not noted any exacerbation of her tremor after caffeine intake and has not been exercising regularly lately but used to exercise very regularly and is slender, has always been slender. She worries about developing PD.  She has seen psychiatry. She is on multiple medications, and sometimes feels she is shaky inside, and it helps to take the lorazepam.  I reviewed your office note from 05/21/2016, which you kindly included. She had recent blood work including CBC with differential on 05/16/2016. This was unremarkable, with the exception of slightly low white blood cell count at 3.5. AST was unremarkable,  ALT was unremarkable, BUN and creatinine unremarkable.  Her Past Medical History Is Significant For: Past Medical History:  Diagnosis Date  . Anxiety   . 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 Kidney failure    blood transfusion  . Moderate episode of recurrent major depressive disorder (HCC)   . Osteoporosis   . Thyroid disease    Hypothyroid    Her Past Surgical History Is Significant For: Past Surgical History:  Procedure Laterality Date  . APPENDECTOMY  1961  . BREAST SURGERY Bilateral 1987 1998   Lumpectomy  . TOTAL ABDOMINAL HYSTERECTOMY  1986   BSO/Fibroids    Her Family History Is Significant For: Family History  Problem Relation Age of Onset  . Lupus Mother   . Asthma Mother   . Parkinson's disease Mother   . Heart disease Father     Her Social History Is Significant For: Social History   Social History  . Marital status: Single    Spouse name: N/A  . Number of children: 1  . Years of education: 21   Occupational History  . Retired    Social History Main Topics  . Smoking status: Never Smoker  . Smokeless tobacco: Never Used  . Alcohol use 1.5 - 2.0 oz/week    3 - 4 Standard drinks or equivalent per week     Comment: 1-2 a night   . Drug use: No  . Sexual activity: No   Other Topics Concern  . None  Social History Narrative   Drinks 2-3 caffeine drinks a day     Her Allergies Are:  No Known Allergies:   Her Current Medications Are:  Outpatient Encounter Prescriptions as of 06/11/2016  Medication Sig  . clonazePAM (KLONOPIN) 1 MG tablet Take 1 mg by mouth as needed for anxiety.  Marland Kitchen. levothyroxine (SYNTHROID, LEVOTHROID) 50 MCG tablet Take 50 mcg by mouth daily before breakfast.  . sertraline (ZOLOFT) 100 MG tablet Take 1 tablet (100 mg total) by mouth daily.  . traZODone (DESYREL) 100 MG tablet Take 100 mg by mouth as needed for sleep.  Marland Kitchen. ALPRAZolam (XANAX) 0.25 MG tablet Take 1 tablet (0.25  mg total) by mouth at bedtime as needed for sleep.  Marland Kitchen. conjugated estrogens (PREMARIN) vaginal cream Place 1 Applicatorful vaginally daily. Use 1/2 g vaginally every night at bed time for the first 2 weeks, then use 1/2 g vaginally two or three times per week as needed to maintain symptom relief.  . methotrexate 2.5 MG tablet Take by mouth once a week.   No facility-administered encounter medications on file as of 06/11/2016.   : Review of Systems:  Out of a complete 14 point review of systems, all are reviewed and negative with the exception of these symptoms as listed below:  Review of Systems  Neurological:       Patient is here for tremors in her hands. States that the R is worse then the left. The tremors started about a month ago. She reports the tremors progressed very quickly. Mother had Parkinson's.     Objective:  Neurologic Exam  Physical Exam Physical Examination:   Vitals:   06/11/16 1023  BP: 116/70  Pulse: 88  Resp: 14    General Examination: The patient is a very pleasant 75 y.o. female in no acute distress. She appears well-developed and well-nourished and well groomed.   HEENT: Normocephalic, atraumatic, pupils are equal, round and reactive to light and accommodation. Funduscopic exam is normal with sharp disc margins noted. Extraocular tracking is good without limitation to gaze excursion or nystagmus noted. Normal smooth pursuit is noted. Hearing is grossly intact. Face is symmetric with normal facial animation and normal facial sensation. Speech is clear with no dysarthria noted. There is no hypophonia. There is no lip, neck/head, jaw or voice tremor. Neck is supple with full range of passive and active motion. There are no carotid bruits on auscultation. Oropharynx exam reveals: mild mouth dryness, adequate dental hygiene and no significant airway crowding. Mallampati is class I. Tongue protrudes centrally and palate elevates symmetrically.   Chest: Clear to  auscultation without wheezing, rhonchi or crackles noted.  Heart: S1+S2+0, regular and normal without murmurs, rubs or gallops noted.   Abdomen: Soft, non-tender and non-distended with normal bowel sounds appreciated on auscultation.  Extremities: There is no pitting edema in the distal lower extremities bilaterally. Pedal pulses are intact.  Skin: Warm and dry without trophic changes noted. There are no varicose veins.  Musculoskeletal: exam reveals no obvious joint deformities, tenderness or joint swelling or erythema.   Neurologically:  Mental status: The patient is awake, alert and oriented in all 4 spheres. Her immediate and remote memory, attention, language skills and fund of knowledge are appropriate. There is no evidence of aphasia, agnosia, apraxia or anomia. Speech is clear with normal prosody and enunciation. Thought process is linear. Mood is decreased range and affect is blunted.  Cranial nerves II - XII are as described above under HEENT exam. In addition:  shoulder shrug is normal with equal shoulder height noted. Motor exam: Normal bulk, strength and tone is noted. There is no drift, resting tremor or rebound. She has a mild intermittent postural tremor in the right more than left upper extremity, no significant action tremor, no intention tremor. On 06/11/2016: Archimedes spiral drawing she has coarse trembling with the left hand, minimal insecurity with the right hand. Handwriting is not micrographic and is large, somewhat shaky but legible. Romberg is negative. Reflexes are 2+ throughout. Babinski: Toes are flexor bilaterally. Fine motor skills and coordination: intact with normal finger taps, normal hand movements, normal rapid alternating patting, normal foot taps and normal foot agility.  Cerebellar testing: No dysmetria or intention tremor on finger to nose testing. Heel to shin is unremarkable bilaterally. There is no truncal or gait ataxia.  Sensory exam: intact to light  touch, pinprick, vibration, temperature sense in the upper and lower extremities.  Gait, station and balance: She stands easily. No veering to one side is noted. No leaning to one side is noted. Posture is age-appropriate and stance is narrow based. Gait shows normal stride length and normal pace. No problems turning are noted. Tandem walk is slightly difficult for her.                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 On examination, she does not have any telltale signs of parkinsonism, and exam is not typical for essential tremor, history also does not suggest essential tremor. This may be a situational, or enhanced physiological tremor, exacerbated by recent stress, also possibly exacerbated by medication. Of note, she is on sertraline which can exacerbate tremors, she has been on this since her husband passed away a year ago. Overall, neurological exam is largely nonfocal. She is encouraged to make an appointment with her psychiatrist as well to review all her medications. I would like to monitor her tremor. She had a head CT in 2002 after a fall, this was unremarkable. I don't think we need to pursue a brain scan at this time we can think about this in the near future if needed. I would like to reevaluate her in 6 months, sooner as needed, I did not suggest any new medication for tremor control at this time and would like to monitor things a little longer, especially, since she noted improvement in the past few weeks.  I answered all their questions today and the patient and her cousin were in agreement.  Thank you very much for allowing me to participate in the care of this nice patient. If I can be of any  further assistance to you please do not hesitate to call me at [phone].  Sincerely,   Huston Foley, MD, PhD

## 2016-06-11 NOTE — Patient Instructions (Signed)
Your exam does not suggest Parkinsonism. Your tremor is mild. May be due to a combination of stress, depression, medication effect, anxiety.   We will monitor your tremor. We may consider a brain scan down the road.

## 2016-07-18 ENCOUNTER — Encounter (HOSPITAL_COMMUNITY): Payer: Self-pay | Admitting: Emergency Medicine

## 2016-07-18 DIAGNOSIS — R339 Retention of urine, unspecified: Secondary | ICD-10-CM | POA: Diagnosis not present

## 2016-07-18 DIAGNOSIS — E039 Hypothyroidism, unspecified: Secondary | ICD-10-CM | POA: Diagnosis not present

## 2016-07-18 DIAGNOSIS — K56609 Unspecified intestinal obstruction, unspecified as to partial versus complete obstruction: Secondary | ICD-10-CM | POA: Diagnosis not present

## 2016-07-18 DIAGNOSIS — K5641 Fecal impaction: Secondary | ICD-10-CM | POA: Diagnosis present

## 2016-07-18 NOTE — ED Triage Notes (Signed)
Pt from home with complaints of fecal impaction. Pt states her last bm was yesterday and was normal. Pt states that she had the urge to have a bm today, but was unable to and now feels pain in her rectum Pt states this began around 1700. Pt states she is now unable to urinate. The last time pt urinated was around 1500.

## 2016-07-19 ENCOUNTER — Emergency Department (HOSPITAL_COMMUNITY)
Admission: EM | Admit: 2016-07-19 | Discharge: 2016-07-19 | Disposition: A | Discharge status: Home / Self Care | Payer: Medicare Other | Attending: Emergency Medicine | Admitting: Emergency Medicine

## 2016-07-19 DIAGNOSIS — R339 Retention of urine, unspecified: Secondary | ICD-10-CM

## 2016-07-19 DIAGNOSIS — K56609 Unspecified intestinal obstruction, unspecified as to partial versus complete obstruction: Secondary | ICD-10-CM

## 2016-07-19 LAB — URINALYSIS, ROUTINE W REFLEX MICROSCOPIC
Bilirubin Urine: NEGATIVE
GLUCOSE, UA: NEGATIVE mg/dL
Hgb urine dipstick: NEGATIVE
KETONES UR: NEGATIVE mg/dL
LEUKOCYTES UA: NEGATIVE
NITRITE: NEGATIVE
PH: 6.5 (ref 5.0–8.0)
PROTEIN: NEGATIVE mg/dL
SPECIFIC GRAVITY, URINE: 1.016 (ref 1.005–1.030)

## 2016-07-19 MED ORDER — FLEET ENEMA 7-19 GM/118ML RE ENEM
1.0000 | ENEMA | Freq: Once | RECTAL | Status: AC
  Administered 2016-07-19: 1 via RECTAL
  Filled 2016-07-19: qty 1

## 2016-07-19 NOTE — ED Provider Notes (Signed)
[facility]-EMERGENCY DEPT Provider Note   CSN: [id number] Arrival date & time: 07/18/16  2137  By signing my name below, I, Phillis Haggis, attest that this documentation has been prepared under the direction and in the presence of Geoffery Lyons, MD. Electronically Signed: Phillis Haggis, ED Scribe. 07/19/16. 3:51 AM.  History   Chief Complaint Chief Complaint  Patient presents with  . Urinary Retention  . Fecal Impaction   The history is provided by the patient. No language interpreter was used.   HPI Comments: Cheryl Austin is a 75 y.o. female with a hx of cutaneous lupus erythematosus, kidney failure, and hypothyroidism who presents to the Emergency Department complaining of constipation onset one day ago. Pt reports associated urinary retention, abdominal pain, nausea, and back pain. Pt says that she had a normal BM two days ago. She attempted to have a BM at 5 PM yesterday and was unable to. She has not tried anything for her symptoms. She denies hx of similar symptoms. Pt's last colonoscopy was 1-2 years ago. She denies vomiting.   Past Medical History:  Diagnosis Date  . Anxiety   . 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 Kidney failure    blood transfusion  . Moderate episode of recurrent major depressive disorder (HCC)   . Osteoporosis   . Thyroid disease    Hypothyroid    There are no active problems to display for this patient.   Past Surgical History:  Procedure Laterality Date  . APPENDECTOMY  1961  . BREAST SURGERY Bilateral 1987 1998   Lumpectomy  . TOTAL ABDOMINAL HYSTERECTOMY  1986   BSO/Fibroids    OB History    Gravida Para Term Preterm AB Living   1 1       1    SAB TAB Ectopic Multiple Live Births                   Home Medications    Prior to Admission medications   Medication Sig Start Date End Date Taking? Authorizing Provider  ALPRAZolam Prudy Feeler) 0.5 MG tablet Take 0.5 mg by mouth.    Historical  Provider, MD  benzonatate (TESSALON) 100 MG capsule  12/08/13   Historical Provider, MD  Besifloxacin HCl (BESIVANCE) 0.6 % SUSP PLACE 1 DROP INTO LEFT EYE 3 TIMES DAILY 09/22/15   Historical Provider, MD  betamethasone dipropionate (DIPROLENE) 0.05 % ointment AAA twice daily as needed 10/27/14   Historical Provider, MD  Bromfenac Sodium (PROLENSA) 0.07 % SOLN PLACE 1 DROP INTO LEFT EYE AT BEDTIME 09/22/15   Historical Provider, MD  buPROPion (WELLBUTRIN SR) 150 MG 12 hr tablet Take by mouth. 06/04/16 06/04/17  Historical Provider, MD  calcium carbonate (OS-CAL) 600 MG TABS tablet Take by mouth.    Historical Provider, MD  cetirizine (ZYRTEC) 10 MG tablet TAKE 1 TABLET BY MOUTH DAILY 09/13/13   Historical Provider, MD  Cholecalciferol (VITAMIN D3) 5000 units TABS Take by mouth.    Historical Provider, MD  clonazePAM (KLONOPIN) 1 MG tablet Take 1 mg by mouth as needed for anxiety.    Historical Provider, MD  conjugated estrogens (PREMARIN) vaginal cream Place 1 Applicatorful vaginally daily. Use 1/2 g vaginally every night at bed time for the first 2 weeks, then use 1/2 g vaginally two or three times per week as needed to maintain symptom relief. 03/17/14   Patton Salles, MD  diclofenac sodium (VOLTAREN) 1 % GEL Place onto the  skin. 07/30/12   Historical Provider, MD  Difluprednate (DUREZOL) 0.05 % EMUL PLACE 1 DROP INTO LEFT EYE 3 TIMES DAILY 09/22/15   Historical Provider, MD  estradiol (ESTRACE) 0.1 MG/GM vaginal cream Use 1/2 finger vaginally 3 x weekly 05/02/15   Historical Provider, MD  eszopiclone (LUNESTA) 2 MG TABS tablet Take 2 mg by mouth at bedtime as needed for sleep. Take immediately before bedtime    Historical Provider, MD  fluticasone (FLONASE) 50 MCG/ACT nasal spray Place into the nose. 07/24/13   Historical Provider, MD  folic acid (FOLVITE) 1 MG tablet TAKE 1 TABLET ON DAYS YOU DONT TAKE METHOTREXATE 05/16/16   Historical Provider, MD  HYDROcodone-homatropine (HYDROMET) 5-1.5 MG/5ML  syrup  12/09/13   Historical Provider, MD  hydrocortisone cream 1 % Apply topically.    Historical Provider, MD  hydrOXYzine (ATARAX/VISTARIL) 25 MG tablet Take 25 mg by mouth. 07/29/13   Historical Provider, MD  levocetirizine (XYZAL) 5 MG tablet  01/19/14   Historical Provider, MD  levothyroxine (SYNTHROID, LEVOTHROID) 50 MCG tablet Take 50 mcg by mouth daily before breakfast.    Historical Provider, MD  LORazepam (ATIVAN) 0.5 MG tablet TAKE 1 TABLET BY MOUTH TWICE A DAY AS NEEDED FOR ANXIETY 05/28/16   Historical Provider, MD  methotrexate (RHEUMATREX) 2.5 MG tablet TAKE 5 TABLETS BY MOUTH WEEKLY. 04/09/16   Historical Provider, MD  Olopatadine HCl 0.2 % SOLN 1 drop into each eye daily 06/22/13   Historical Provider, MD  omeprazole (PRILOSEC) 20 MG capsule TAKE ONE CAPSULE (20 MG TOTAL) BY MOUTH DAILY. 04/12/16   Historical Provider, MD  sertraline (ZOLOFT) 100 MG tablet Take 1 tablet (100 mg total) by mouth daily. 06/19/13   Brook Rosalin HawkingE Amundson C Silva, MD  traZODone (DESYREL) 100 MG tablet Take 100 mg by mouth as needed for sleep.    Historical Provider, MD  triazolam (HALCION) 0.125 MG tablet TAKE 1 TABLET AT BEDTIME. MAY REPEAT 12/17/15   Historical Provider, MD  valACYclovir (VALTREX) 500 MG tablet TAKE 1 TABLET BY MOUTH TWICE A DAY FOR 2 WEEKS 10/11/15   Historical Provider, MD    Family History Family History  Problem Relation Age of Onset  . Lupus Mother   . Asthma Mother   . Parkinson's disease Mother   . Heart disease Father     Social History Social History  Substance Use Topics  . Smoking status: Never Smoker  . Smokeless tobacco: Never Used  . Alcohol use 1.5 - 2.0 oz/week    3 - 4 Standard drinks or equivalent per week     Comment: 1-2 a night      Allergies   Patient has no known allergies.   Review of Systems Review of Systems  Gastrointestinal: Positive for abdominal pain, constipation and nausea. Negative for vomiting.  Genitourinary: Positive for decreased urine  volume.  Musculoskeletal: Positive for back pain.  All other systems reviewed and are negative.    Physical Exam Updated Vital Signs BP 134/76 (BP Location: Left Arm)   Pulse 105   Temp 98.1 F (36.7 C) (Oral)   Resp 20   Ht 5\' 4"  (1.626 m)   Wt 113 lb (51.3 kg)   SpO2 100%   BMI 19.40 kg/m   Physical Exam  Constitutional: She appears well-developed and well-nourished. No distress.  HENT:  Head: Normocephalic and atraumatic.  Mouth/Throat: Oropharynx is clear and moist. No oropharyngeal exudate.  Eyes: Conjunctivae and EOM are normal. Pupils are equal, round, and reactive to light.  Right eye exhibits no discharge. Left eye exhibits no discharge. No scleral icterus.  Neck: Normal range of motion. Neck supple. No JVD present. No thyromegaly present.  Cardiovascular: Normal rate, regular rhythm, normal heart sounds and intact distal pulses.  Exam reveals no gallop and no friction rub.   No murmur heard. Pulmonary/Chest: Effort normal and breath sounds normal. No respiratory distress. She has no wheezes. She has no rales.  Abdominal: Soft. Bowel sounds are normal. She exhibits no distension and no mass. There is tenderness in the suprapubic area.  Suprapubic TTP  Genitourinary: Rectal exam shows no external hemorrhoid and no internal hemorrhoid.  Genitourinary Comments: There is a fecal impaction present. No hemorrhoids or other lesions noted  Musculoskeletal: Normal range of motion. She exhibits no edema or tenderness.  Lymphadenopathy:    She has no cervical adenopathy.  Neurological: She is alert. Coordination normal.  Skin: Skin is warm and dry. No rash noted. No erythema.  Psychiatric: She has a normal mood and affect. Her behavior is normal.  Nursing note and vitals reviewed.    ED Treatments / Results  DIAGNOSTIC STUDIES: Oxygen Saturation is 100% on RA, normal  by my interpretation.    COORDINATION OF CARE: 3:50 AM-Discussed treatment plan which includes labs and  fleet enemas with pt at bedside and pt agreed to plan.    Labs (all labs ordered are listed, but only abnormal results are displayed) Labs Reviewed  URINALYSIS, ROUTINE W REFLEX MICROSCOPIC (NOT AT [facility])    EKG  EKG Interpretation None       Radiology No results found.  Procedures Procedures (including critical care time)  Medications Ordered in ED Medications - No data to display   Initial Impression / Assessment and Plan / ED Course  I have reviewed the triage vital signs and the nursing notes.  Pertinent labs & imaging results that were available during my care of the patient were reviewed by me and considered in my medical decision making (see chart for details).  Clinical Course     Patient given a fleets enema with good results and resolution of her symptoms. Her urinalysis is clear. She will be discharged with when necessary return.  Final Clinical Impressions(s) / ED Diagnoses   Final diagnoses:  None  I personally performed the services described in this documentation, which was scribed in my presence. The recorded information has been reviewed and is accurate.       New Prescriptions New Prescriptions   No medications on file     Geoffery Lyonsouglas Recardo Linn, MD 07/19/16 [phone]

## 2016-07-19 NOTE — ED Notes (Signed)
Patient expressed great relief after enema and was anxious to leave. Patient left hospital with out dc papers while nurse was in room assisting another patient.

## 2016-07-19 NOTE — ED Notes (Signed)
Bladder rescan resulted 470. Pt has hypoactive bowel sounds

## 2016-07-19 NOTE — ED Notes (Signed)
Bladder scan of 

## 2016-07-19 NOTE — Discharge Instructions (Signed)
Return to the emergency department if you develop any new or worsening symptoms

## 2016-12-10 ENCOUNTER — Encounter (INDEPENDENT_AMBULATORY_CARE_PROVIDER_SITE_OTHER): Payer: Self-pay

## 2016-12-10 ENCOUNTER — Ambulatory Visit (INDEPENDENT_AMBULATORY_CARE_PROVIDER_SITE_OTHER): Payer: Medicare Other | Admitting: Neurology

## 2016-12-10 ENCOUNTER — Encounter: Payer: Self-pay | Admitting: Neurology

## 2016-12-10 VITALS — BP 108/68 | HR 75 | Ht 63.0 in | Wt 113.0 lb

## 2016-12-10 DIAGNOSIS — R251 Tremor, unspecified: Secondary | ICD-10-CM | POA: Diagnosis not present

## 2016-12-10 DIAGNOSIS — R419 Unspecified symptoms and signs involving cognitive functions and awareness: Secondary | ICD-10-CM

## 2016-12-10 NOTE — Progress Notes (Signed)
Subjective:    Patient ID: Cheryl Austin is a 76 y.o. female.  HPI     Interim history:   Cheryl Austin is a 76 year old right-handed woman with an underlying medical history of depression, hypothyroidism, reflux disease, anxiety, osteoporosis, cutaneous lupus (followed by dermatology), on Methotrexate, who returns for follow-up consultation of her tremors, affecting both upper extremities. The patient is unaccompanied today. I first met her on 06/11/2016 at the request of her primary care physician, at which time she reported a 2-3 month history of intermittent right more than left hand tremors. History and exam were not telltale for essential tremor and she did not have any parkinsonian findings. I suggested clinical monitoring and reevaluation in 6 months.   Today, 12/10/2016 (all dictated new, as well as above notes, some dictation done in note pad or Word, outside of chart, may appear as copied):  She reports that her tremor improved a little, stable now, still more on R. She admits to more stress, has a lot of legal paperwork regarding her daughter. She admits to having residual depression. She sleeps with the TV on. She takes Ativan once daily around 3 PM which helps her anxiety. She may be up for a revisit with her primary care physician. She sees Dr. Casimiro Needle, and has a follow-up next month. She does not currently see a Social worker. She tries to socialize but not very much. She spends most of her time at the computer. She is not currently exercising very much, she used to be a very avid exerciser. She also admits to having less appetite. She has not lost any significant amount of weight but compared to about 2 to 3 years ago she is about 15 pounds less. She complains of forgetfulness, difficulty concentrating and paying attention.  The patient's allergies, current medications, family history, past medical history, past social history, past surgical history and problem list were reviewed and  updated as appropriate.   Previously (copied from previous notes for reference):   06/11/2016: She reports a 2-3 month history of intermittent R more than L hand tremors. She has a FX of PD in her mother who lived to be 85 yo.  She has not fallen. She has had recent stressors, including her husband's passing a little over a year ago and recently losing her only daughter to a tragic death.  She has had trouble with fine motor skills, but in the last few weeks, symptoms may have improved mildly. She has 3 brothers, none of whom have tremor issues. She does not typically drink alcohol on a regular basis, caffeine in the form of coffee, 2 cups per day on average, maybe 3 cups, typically no exacerbation noted. She is retired. She lives alone. She has not noted any exacerbation of her tremor after caffeine intake and has not been exercising regularly lately but used to exercise very regularly and is slender, has always been slender. She worries about developing PD.  She has seen psychiatry. She is on multiple medications, and sometimes feels she is shaky inside, and it helps to take the lorazepam.  I reviewed your office note from 05/21/2016, which you kindly included. She had recent blood work including CBC with differential on 05/16/2016. This was unremarkable, with the exception of slightly low white blood cell count at 3.5. AST was unremarkable, ALT was unremarkable, BUN and creatinine unremarkable.   Her Past Medical History Is Significant For: Past Medical History:  Diagnosis Date  . Anxiety   . Cutaneous lupus erythematosus  like syndrome "Reme Disease"  Steroids plaquinel  . Kidney failure    blood transfusion  . Moderate episode of recurrent major depressive disorder ([place])   . Osteoporosis   . Thyroid disease    Hypothyroid    Her Past Surgical History Is Significant For: Past Surgical History:  Procedure Laterality Date  . APPENDECTOMY  1961  . BREAST SURGERY Bilateral 1987 1998    Lumpectomy  . TOTAL ABDOMINAL HYSTERECTOMY  1986   BSO/Fibroids    Her Family History Is Significant For: Family History  Problem Relation Age of Onset  . Lupus Mother   . Asthma Mother   . Parkinson's disease Mother   . Heart disease Father     Her Social History Is Significant For: Social History   Social History  . Marital status: Widowed    Spouse name: N/A  . Number of children: 1  . Years of education: 32   Occupational History  . Retired    Social History Main Topics  . Smoking status: Never Smoker  . Smokeless tobacco: Never Used  . Alcohol use 1.5 - 2.0 oz/week    3 - 4 Standard drinks or equivalent per week     Comment: 1-2 a night   . Drug use: No  . Sexual activity: No   Other Topics Concern  . None   Social History Narrative   Drinks 2-3 caffeine drinks a day     Her Allergies Are:  No Known Allergies:   Her Current Medications Are:  Outpatient Encounter Prescriptions as of 12/10/2016  Medication Sig  . buPROPion (WELLBUTRIN SR) 150 MG 12 hr tablet Take by mouth.  . fluticasone (FLONASE) 50 MCG/ACT nasal spray Place into the nose.  . folic acid (FOLVITE) 1 MG tablet TAKE 1 TABLET ON DAYS YOU DONT TAKE METHOTREXATE  . levothyroxine (SYNTHROID, LEVOTHROID) 50 MCG tablet Take 50 mcg by mouth daily before breakfast.  . LORazepam (ATIVAN) 0.5 MG tablet Take 1-2 tablets by mouth daily as needed.  . methotrexate (RHEUMATREX) 2.5 MG tablet TAKE 3 TABLETS BY MOUTH WEEKLY.  . traZODone (DESYREL) 100 MG tablet Take 100 mg by mouth as needed for sleep.  . [DISCONTINUED] ALPRAZolam (XANAX) 0.5 MG tablet Take 0.5 mg by mouth.  . [DISCONTINUED] benzonatate (TESSALON) 100 MG capsule   . [DISCONTINUED] Besifloxacin HCl (BESIVANCE) 0.6 % SUSP PLACE 1 DROP INTO LEFT EYE 3 TIMES DAILY  . [DISCONTINUED] betamethasone dipropionate (DIPROLENE) 0.05 % ointment AAA twice daily as needed  . [DISCONTINUED] Bromfenac Sodium (PROLENSA) 0.07 % SOLN PLACE 1 DROP INTO LEFT EYE  AT BEDTIME  . [DISCONTINUED] calcium carbonate (OS-CAL) 600 MG TABS tablet Take by mouth.  . [DISCONTINUED] cetirizine (ZYRTEC) 10 MG tablet TAKE 1 TABLET BY MOUTH DAILY  . [DISCONTINUED] Cholecalciferol (VITAMIN D3) 5000 units TABS Take by mouth.  . [DISCONTINUED] clonazePAM (KLONOPIN) 1 MG tablet Take 1 mg by mouth as needed for anxiety.  . [DISCONTINUED] conjugated estrogens (PREMARIN) vaginal cream Place 1 Applicatorful vaginally daily. Use 1/2 g vaginally every night at bed time for the first 2 weeks, then use 1/2 g vaginally two or three times per week as needed to maintain symptom relief.  . [DISCONTINUED] diclofenac sodium (VOLTAREN) 1 % GEL Place onto the skin.  . [DISCONTINUED] Difluprednate (DUREZOL) 0.05 % EMUL PLACE 1 DROP INTO LEFT EYE 3 TIMES DAILY  . [DISCONTINUED] estradiol (ESTRACE) 0.1 MG/GM vaginal cream Use 1/2 finger vaginally 3 x weekly  . [DISCONTINUED] eszopiclone (LUNESTA) 2 MG TABS  tablet Take 2 mg by mouth at bedtime as needed for sleep. Take immediately before bedtime  . [DISCONTINUED] HYDROcodone-homatropine (HYDROMET) 5-1.5 MG/5ML syrup   . [DISCONTINUED] hydrocortisone cream 1 % Apply topically.  . [DISCONTINUED] hydrOXYzine (ATARAX/VISTARIL) 25 MG tablet Take 25 mg by mouth.  . [DISCONTINUED] levocetirizine (XYZAL) 5 MG tablet   . [DISCONTINUED] Olopatadine HCl 0.2 % SOLN 1 drop into each eye daily  . [DISCONTINUED] omeprazole (PRILOSEC) 20 MG capsule TAKE ONE CAPSULE (20 MG TOTAL) BY MOUTH DAILY.  . [DISCONTINUED] sertraline (ZOLOFT) 100 MG tablet Take 1 tablet (100 mg total) by mouth daily.  . [DISCONTINUED] triazolam (HALCION) 0.125 MG tablet TAKE 1 TABLET AT BEDTIME. MAY REPEAT  . [DISCONTINUED] valACYclovir (VALTREX) 500 MG tablet TAKE 1 TABLET BY MOUTH TWICE A DAY FOR 2 WEEKS   No facility-administered encounter medications on file as of 12/10/2016.   :  Review of Systems:  Out of a complete 14 point review of systems, all are reviewed and negative with  the exception of these symptoms as listed below:  Review of Systems  Neurological:       Pt presents today to follow up on her tremor. Pt says that her tremor is better. Pt also says that her memory has declined since her husband passed away.    Objective:  Neurologic Exam  Physical Exam  Physical Examination:   Vitals:   12/10/16 1312  BP: 108/68  Pulse: 75    General Examination: The patient is a very pleasant 76 y.o. female in no acute distress. She appears well-developed and well-nourished and well groomed. She is mildly anxious appearing. She is near tears at times.  HEENT: Normocephalic, atraumatic, pupils are equal, round and reactive to light and accommodation. Extraocular tracking is good without limitation to gaze excursion or nystagmus noted. Normal smooth pursuit is noted. Hearing is grossly intact. He has no lip, neck or jaw tremor. She has normal speech, slightly hypophonic at times. She has no voice tremor. Airway examination is unchanged. Tongue and palate are central/symmetrical.   Chest: Clear to auscultation without wheezing, rhonchi or crackles noted.  Heart: S1+S2+0, regular and normal without murmurs, rubs or gallops noted.   Abdomen: Soft, non-tender and non-distended with normal bowel sounds appreciated on auscultation.  Extremities: There is no overt abnormality.   Skin: Warm and dry without trophic changes noted.  Musculoskeletal: exam reveals no obvious joint deformities, tenderness or joint swelling or erythema.   Neurologically:  Mental status: The patient is awake, alert and oriented in all 4 spheres. Her immediate and remote memory, attention, language skills and fund of knowledge are appropriate. There is no evidence of aphasia, agnosia, apraxia or anomia. Speech is clear with normal prosody and enunciation. Thought process is linear. Mood is decreased range and affect is blunted.  Cranial nerves II - XII are as described above under HEENT exam. In  addition: shoulder shrug is normal with equal shoulder height noted. Motor exam: Normal bulk, strength and tone is noted. There is no drift, resting tremor or rebound. She has a minimal postural tremor in both upper extremities, right more than left, no significant action tremor. Romberg is negative. Reflexes are 2+ throughout. Fine motor skills and coordination: intact.  Cerebellar testing: No dysmetria or intention tremor. There is no truncal or gait ataxia.  Sensory exam: intact to light touch in the upper and lower extremities.  Gait, station and balance: She stands easily. No veering to one side is noted. No leaning to one  side is noted. Posture is age-appropriate and stance is narrow based. Gait shows normal stride length and normal pace. No problems turning are noted.                Assessment and Plan:    In summary, Cheryl Austin is a very pleasant 76 y.o.-year old female with an underlying medical history of depression, hypothyroidism, reflux disease, anxiety, osteoporosis, cutaneous lupus (followed by dermatology), on Methotrexate, who presents for follow-up consultation of her intermittent tremors. She believes her tremor is a little better. On examination she has a slightly improved finding. She also complains of memory issues including forgetfulness, difficulty focusing and difficulty with attention. She is under a lot of stress lately and also admits to residual depression and anxiety. She has a follow-up with her psychiatrist next month, she may also need to make a follow-up appointment with her primary care physician routinely. Physical exam and neurological exam are reassuring. She is advised that stress can be a contributor to cognitive complaints as well as residual mood disorder. She is encouraged to talk to her psychiatrist about potentially seeing a counselor as well. She is advised to start exercising again with some routine. She would benefit from socializing more as well. She is  encouraged to stay well hydrated with water and also eat nutritious food. She is on the low end of normal with her weight currently. I suggested a one-year checkup routinely with me. I answered all her questions today and she was in agreement. I spent 25 minutes in total face-to-face time with the patient, more than 50% of which was spent in counseling and coordination of care, reviewing test results, reviewing medication and discussing or reviewing the diagnosis of tremor, cogn complaints, the prognosis and treatment options. Pertinent laboratory and imaging test results that were available during this visit with the patient were reviewed by me and considered in my medical decision making (see chart for details).

## 2016-12-10 NOTE — Patient Instructions (Signed)
I think you have a lot of stress right now. Please talk to Dr. Donell Beers about seeing a counselor.  Please also talk to Dr. Mardelle Matte about it.  Your tremor is stable. Your memory seems okay to me.  Please try to stay well hydrated, well nourished and exercise regularly.  I can see you back in one year for a check up.

## 2017-07-31 ENCOUNTER — Other Ambulatory Visit: Payer: Self-pay | Admitting: *Deleted

## 2017-08-01 ENCOUNTER — Encounter: Payer: Self-pay | Admitting: Family Medicine

## 2017-08-01 ENCOUNTER — Encounter: Payer: Self-pay | Admitting: *Deleted

## 2017-08-02 ENCOUNTER — Encounter: Payer: Self-pay | Admitting: Family Medicine

## 2017-08-02 ENCOUNTER — Ambulatory Visit: Payer: Medicare Other | Admitting: Family Medicine

## 2017-08-02 VITALS — BP 108/70 | HR 75 | Temp 98.4°F | Ht 63.0 in | Wt 116.2 lb

## 2017-08-02 DIAGNOSIS — F331 Major depressive disorder, recurrent, moderate: Secondary | ICD-10-CM

## 2017-08-02 DIAGNOSIS — Z23 Encounter for immunization: Secondary | ICD-10-CM

## 2017-08-02 DIAGNOSIS — G2581 Restless legs syndrome: Secondary | ICD-10-CM

## 2017-08-02 HISTORY — DX: Restless legs syndrome: G25.81

## 2017-08-02 MED ORDER — VORTIOXETINE HBR 10 MG PO TABS: 10.0000 mg | ORAL_TABLET | Freq: Every day | ORAL | 5 refills | Status: DC

## 2017-08-02 NOTE — Progress Notes (Signed)
Subjective  CC:  Chief Complaint  Patient presents with  . Establish Care    Transfer from [facility]  . Depression    HPI: Cheryl Austin is a 76 y.o. female who presents to [facility] today to establish care with me as a new patient. She is a former [facility] patient and is here to reestablish care with me today.   She has the following concerns or needs:   Depression f/u: remains moderately to severely depressed. No longer on wellbutrin and feels more "clear". On zoloft and uses ativan at 3pm everyday for "anxiety". Still struggles with life: not motivated to live but not suicidal. Now seeing a priest to try to make sense of it all. No longer having RLS like sxs; uses trazodone to sleep and this helps.   Due for cpe in jan. Reviewed old notes and labs.   We updated and reviewed the patient's past history in detail and it is documented below.  Patient Active Problem List   Diagnosis Date Noted  . Family history of Parkinson disease 05/21/2016  . Seborrheic dermatitis 11/02/2014  . Osteopenia 07/13/2013  . Moderate episode of recurrent major depressive disorder (HCC) 06/22/2013  . Dermatitis, atopic 01/30/2013  . Menopausal hot flushes 01/30/2013  . MRSA cellulitis 01/30/2013  . 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 Insomnia 03/18/2012  . Hypothyroidism 12/18/2010    Health Maintenance  Topic Date Due  . MAMMOGRAM  08/04/2016  . DEXA SCAN  07/27/2017  . COLONOSCOPY  01/19/2020  . TETANUS/TDAP  06/20/2023  . INFLUENZA VACCINE  Completed  . PNA vac Low Risk Adult  Completed   Immunization History  Administered Date(s) Administered  . DT 05/21/2008  . Influenza Split 06/20/2010, 05/14/2011, 06/18/2012, 06/22/2013, 07/02/2014, 07/05/2015  . Influenza, High Dose Seasonal PF 06/18/2012, 06/18/2012, 06/22/2013, 06/22/2013, 07/02/2014, 07/02/2014, 07/05/2015, 07/05/2015, 05/21/2016, 05/21/2016, 08/02/2017  . Influenza, Seasonal, Injecte, Preservative Fre  05/14/2011  . Influenza,trivalent, recombinat, inj, PF 05/14/2011  . Influenza-Unspecified 05/14/2011, 06/18/2012, 06/22/2013, 07/02/2014, 07/05/2015  . Pneumococcal Conjugate-13 07/04/2014, 07/04/2014  . Pneumococcal Polysaccharide-23 03/20/2005, 06/22/2011  . Pneumococcal-Unspecified 03/20/2005, 06/22/2011  . Td 05/21/2008  . Tdap 06/22/2011, 06/19/2013   Current Meds  Medication Sig  . folic acid (FOLVITE) 1 MG tablet TAKE 1 TABLET ON DAYS YOU DONT TAKE METHOTREXATE  . levothyroxine (SYNTHROID, LEVOTHROID) 50 MCG tablet Take 50 mcg by mouth daily before breakfast.  . LORazepam (ATIVAN) 0.5 MG tablet Take 1-2 tablets by mouth daily as needed.  . methotrexate (RHEUMATREX) 2.5 MG tablet TAKE 3 TABLETS BY MOUTH WEEKLY.  . sertraline (ZOLOFT) 100 MG tablet Take 100 mg by mouth daily.  . traZODone (DESYREL) 100 MG tablet Take 100 mg by mouth as needed for sleep.   Allergies: Patient has No Known Allergies.  Past Medical History Patient  has a past medical history of Anxiety, Cutaneous lupus erythematosus, Family history of Parkinson disease (05/21/2016), GERD (gastroesophageal reflux disease), Kidney failure, Moderate episode of recurrent major depressive disorder (HCC), Osteoporosis, Restless legs syndrome (RLS) (08/02/2017), and Thyroid disease. Past Surgical History Patient  has a past surgical history that includes Total abdominal hysterectomy (1986); Breast surgery (Bilateral, 1987 1998); Appendectomy (1961); and Eye surgery. Family History: Patient family history includes Alcohol abuse in her brother and daughter; Asthma in her mother; Depression in her daughter and mother; Heart disease in her father; Hyperlipidemia in her father; Lupus in her mother; Parkinson's disease in her mother; Stroke in her father. Social History:  Patient  reports that  has never smoked. she has never used smokeless tobacco. She reports that she drinks alcohol. She reports that she does not use  drugs.  Review of Systems: Constitutional: negative for fever or malaise Cardiovascular: negative for chest pain Respiratory: negative for SOB or persistent cough Gastrointestinal: negative for abdominal pain Genitourinary: negative for dysuria or gross hematuria Musculoskeletal: negative for new gait disturbance or muscular weakness Integumentary: negative for new or persistent rashes  Patient Care Team    Relationship Specialty Notifications Start End  Willow OraAndy, Kecia Swoboda L, MD PCP - General Family Medicine  06/11/16     Objective  Vitals: BP 108/70 (BP Location: Left Arm, Patient Position: Sitting, Cuff Size: Normal)   Pulse 75   Temp 98.4 F (36.9 C) (Oral)   Ht 5\' 3"  (1.6 m)   Wt 116 lb 4 oz (52.7 kg)   SpO2 97%   BMI 20.59 kg/m  General:  Well developed, well nourished, no acute distress  Psych:  Alert and oriented, flat mood and affect  Assessment  1. Moderate episode of recurrent major depressive disorder (HCC)   2. Need for prophylactic vaccination and inoculation against influenza      Plan   Today's visit was 25 minutes long. Greater than 50% of this time was devoted to face to face counseling with the patient and coordination of care. We discussed her diagnosis, prognosis, treatment options and decided to try trintellix if it is not cost prohibitive. Discussed finding motivation through other external sources.   F/u 4-6 weeks for recheck.  HM: pt declines HM screens at this time (discussed due to depressive sxs). Flu shot updated today.  Follow up: No Follow-up on file.   Commons side effects, risks, benefits, and alternatives for medications and treatment plan prescribed today were discussed, and the patient expressed understanding of the given instructions. Patient is instructed to call or message via MyChart if he/she has any questions or concerns regarding our treatment plan. No barriers to understanding were identified. We discussed Red Flag symptoms and signs  in detail. Patient expressed understanding regarding what to do in case of urgent or emergency type symptoms.   Medication list was reconciled, printed and provided to the patient in AVS. Patient instructions and summary information was reviewed with the patient as documented in the AVS. This note was prepared with assistance of Dragon voice recognition software. Occasional wrong-word or sound-a-like substitutions may have occurred due to the inherent limitations of voice recognition software  Orders Placed This Encounter  Procedures  . Flu vaccine HIGH DOSE PF   Meds ordered this encounter  Medications  . vortioxetine HBr (TRINTELLIX) 10 MG TABS    Sig: Take 1 tablet (10 mg total) by mouth daily.    Dispense:  30 tablet    Refill:  5

## 2017-08-02 NOTE — Patient Instructions (Signed)
It was so good seeing you again! Thank you for establishing with my new practice and allowing me to continue caring for you. It means a lot to me.   Please schedule a follow up appointment with me in 4-6 weeks for f/u mood.   When you're feeling too down to do anything, try these 10 Little things!  Take a shower. Even if you plan to stay in all day long and not see a soul, take a shower. It takes the most effort to hop in to the shower but once you do, you'll feel immediate results. It will wake you up and you'll be feeling much fresher (and cleaner too).  Brush and floss your teeth. Give your teeth a good brushing with a floss finish. It's a small task but it feels so good and you can check 'taking care of your health' off the list of things to do.  Do something small on your list. Most of us have some small thing we would like to get done (load of laundry, sew a button, email a friend). Doing one of these things will make you feel like you've accomplished something.  Drink water. Drinking water is easy right? It's also really beneficial for your health so keep a glass beside you all day and take sips often. It gives you energy and prevents you from boredom eating.  Do some floor exercises. The last thing you want to do is exercise but it might be just the thing you need the most. Keep it simple and do exercises that involve sitting or laying on the floor. Even the smallest of exercises release chemicals in the brain that make you feel good. Yoga stretches or core exercises are going to make you feel good with minimal effort.  Make your bed. Making your bed takes a few minutes but it's productive and you'll feel relieved when it's done. An unmade bed is a huge visual reminder that you're having an unproductive day. Do it and consider it your housework for the day.  Put on some nice clothes. Take the sweatpants off even if you don't plan to go anywhere. Put on clothes that make you feel  good. Take a look in the mirror so your brain recognizes the sweatpants have been replaced with clothes that make you look great. It's an instant confidence booster.  Wash the dishes. A pile of dirty dishes in the sink is a reflection of your mood. It's possible that if you wash up the dishes, your mood will follow suit. It's worth a try.  Cook a real meal. If you have the luxury to have a "do nothing" day, you have time to make a real meal for yourself. Make a meal that you love to eat. The process is good to get you out of the funk and the food will ensure you have more energy for tomorrow.  Write out your thoughts by hand. When you hand write, you stimulate your brain to focus on the moment that you're in so make yourself comfortable and write whatever comes into your mind. Put those thoughts out on paper so they stop spinning around in your head. Those thoughts might be the very thing holding you down.

## 2017-09-10 ENCOUNTER — Ambulatory Visit: Payer: Medicare Other | Admitting: Family Medicine

## 2017-09-10 ENCOUNTER — Encounter: Payer: Self-pay | Admitting: Family Medicine

## 2017-09-10 VITALS — BP 132/80 | HR 68 | Temp 98.3°F | Ht 63.0 in | Wt 117.2 lb

## 2017-09-10 DIAGNOSIS — F331 Major depressive disorder, recurrent, moderate: Secondary | ICD-10-CM | POA: Diagnosis not present

## 2017-09-10 DIAGNOSIS — F410 Panic disorder [episodic paroxysmal anxiety] without agoraphobia: Secondary | ICD-10-CM | POA: Diagnosis not present

## 2017-09-10 NOTE — Progress Notes (Signed)
Subjective  CC:  Chief Complaint  Patient presents with  . Sinus Problem    x 3 weeks  . Tachycardia  . Follow-up    Depression    HPI: Cheryl Austin is a 77 y.o. female who presents to the office today to address the problems listed above in the chief complaint.  Patient here to follow-up her depression and anxiety.  During last visit she was moderately to severely depressed.  We discussed changing to trintellix.  She had a vacation to [place] at the end of December and did better while there.  Her mood was less depressed and she did not have significant anxiety symptoms there.  Since returning, however, she has increased stressors and is now experiencing panic symptoms.  Complains of palpitations and increased anxiety.  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  She will be traveling to [place].  She has been preparing for this and tends to perseverate and stay up all night researching.  She reports she did stop Zoloft abruptly and changed to the new medication for about 1-2 weeks but had worsening anxiety so switched back to Zoloft.  She uses Ativan daily.  Reports URI about 3 weeks ago.  Now with persistent blood-tinged rhinorrhea.  No sinus pain purulent discharge or malaise.  She does have a history of allergies. I reviewed the patients updated PMH, FH, and SocHx.    Patient Active Problem List   Diagnosis Date Noted  . Family history of Parkinson disease 05/21/2016  . Seborrheic dermatitis 11/02/2014  . Osteopenia 07/13/2013  . Moderate episode of recurrent major depressive disorder (HCC) 06/22/2013  . Dermatitis, atopic 01/30/2013  . Menopausal hot flushes 01/30/2013  . MRSA cellulitis 01/30/2013  . 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 Insomnia 03/18/2012  . Hypothyroidism 12/18/2010   Current Meds  Medication Sig  . folic acid (FOLVITE) 1 MG tablet TAKE 1 TABLET ON DAYS YOU DONT TAKE METHOTREXATE  . levothyroxine (SYNTHROID, LEVOTHROID)  50 MCG tablet Take 50 mcg by mouth daily before breakfast.  . LORazepam (ATIVAN) 0.5 MG tablet Take 1-2 tablets by mouth daily as needed.  . methotrexate (RHEUMATREX) 2.5 MG tablet TAKE 3 TABLETS BY MOUTH WEEKLY.  . sertraline (ZOLOFT) 100 MG tablet Take 100 mg by mouth daily.  . traZODone (DESYREL) 100 MG tablet Take 100 mg by mouth as needed for sleep.    Allergies: Patient has No Known Allergies. Family History: Patient family history includes Alcohol abuse in her brother and daughter; Asthma in her mother; Depression in her daughter and mother; Heart disease in her father; Hyperlipidemia in her father; Lupus in her mother; Parkinson's disease in her mother; Stroke in her father. Social History:  Patient  reports that  has never smoked. she has never used smokeless tobacco. She reports that she drinks alcohol. She reports that she does not use drugs.  Review of Systems: Constitutional: Negative for fever malaise or anorexia Cardiovascular: negative for chest pain, positive for palpitations, no exertional symptoms Respiratory: negative for SOB or persistent cough Gastrointestinal: negative for abdominal pain  Objective  Vitals: BP 132/80   Pulse 68   Temp 98.3 F (36.8 C) (Oral)   Ht 5\' 3"  (1.6 m)   Wt 117 lb 3.2 oz (53.2 kg)   SpO2 96%   BMI 20.76 kg/m  General: no acute distress , A&Ox3 Psych: Appears much happier today no longer hypokinetic, more interactive, normal speech HEENT: PEERL, conjunctiva normal, Oropharynx moist,neck is  supple, nasal mucosa is erythematous and swollen, no active bleeding, some scabbing present Cardiovascular:  RRR without murmur or gallop.  Respiratory:  Good breath sounds bilaterally, CTAB with normal respiratory effort Skin:  Warm, no rashes  Assessment  1. Moderate episode of recurrent major depressive disorder (HCC)   2. Panic attack      Plan   Depression and anxiety with panic attack due to increased stress: Discussed management  strategies.  Patient is dependent on daily benzo, for now with increased stressors may increase use to twice daily or 3 times daily as needed.  Also discussed recommendation to change to trintellix.  We will do this after her upcoming deposition.  However, this time she needs to wean the Zoloft so she will not have rebound anxiety.  Weaning instructions are written in her after visit summary.  She will return her in 3 months for complete physical and follow-up on her depression and anxiety.  Sooner if needed.  Nasal mucosa inflammation post infection-humidifier, Flonase and antihistamine recommended.  Follow up: No Follow-up on file.    Commons side effects, risks, benefits, and alternatives for medications and treatment plan prescribed today were discussed, and the patient expressed understanding of the given instructions. Patient is instructed to call or message via MyChart if he/she has any questions or concerns regarding our treatment plan. No barriers to understanding were identified. We discussed Red Flag symptoms and signs in detail. Patient expressed understanding regarding what to do in case of urgent or emergency type symptoms.   Medication list was reconciled, printed and provided to the patient in AVS. Patient instructions and summary information was reviewed with the patient as documented in the AVS. This note was prepared with assistance of Dragon voice recognition software. Occasional wrong-word or sound-a-like substitutions may have occurred due to the inherent limitations of voice recognition software  No orders of the defined types were placed in this encounter.  No orders of the defined types were placed in this encounter.

## 2017-09-10 NOTE — Patient Instructions (Signed)
Please return in 3 months for you complete your physical.Panic Attack A panic attack is a sudden episode of severe anxiety, fear, or discomfort that causes physical and emotional symptoms. The attack may be in response to something frightening, or it may occur for no known reason. Symptoms of a panic attack can be similar to symptoms of a heart attack or stroke. It is important to see your health care provider when you have a panic attack so that these conditions can be ruled out. A panic attack is a symptom of another condition. Most panic attacks go away with treatment of the underlying problem. If you have panic attacks often, you may have a condition called panic disorder. What are the causes? A panic attack may be caused by:  An extreme, life-threatening situation, such as a war or natural disaster.  An anxiety disorder, such as post-traumatic stress disorder.  Depression.  Certain medical conditions, including heart problems, neurological conditions, and infections.  Certain over-the-counter and prescription medicines.  Illegal drugs that increase heart rate and blood pressure, such as methamphetamine.  Alcohol.  Supplements that increase anxiety.  Panic disorder.  What increases the risk? You are more likely to develop this condition if:  You have an anxiety disorder.  You have another mental health condition.  You take certain medicines.  You use alcohol, illegal drugs, or other substances.  You are under extreme stress.  A life event is causing increased feelings of anxiety and depression.  What are the signs or symptoms? A panic attack starts suddenly, usually lasts about 20 minutes, and occurs with one or more of the following:  A pounding heart.  A feeling that your heart is beating irregularly or faster than normal (palpitations).  Sweating.  Trembling or shaking.  Shortness of breath or feeling smothered.  Feeling choked.  Chest pain or  discomfort.  Nausea or a strange feeling in your stomach.  Dizziness, feeling lightheaded, or feeling like you might faint.  Chills or hot flashes.  Numbness or tingling in your lips, hands, or feet.  Feeling confused, or feeling that you are not yourself.  Fear of losing control or being emotionally unstable.  Fear of dying.  How is this diagnosed? A panic attack is diagnosed with an assessment by your health care provider. During the assessment your health care provider will ask questions about:  Your history of anxiety, depression, and panic attacks.  Your medical history.  Whether you drink alcohol, use illegal drugs, take supplements, or take medicines. Be honest about your substance use.  Your health care provider may also:  Order blood tests or other kinds of tests to rule out serious medical conditions.  Refer you to a mental health professional for further evaluation.  How is this treated? Treatment depends on the cause of the panic attack:  If the cause is a medical problem, your health care provider will either treat that problem or refer you to a specialist.  If the cause is emotional, you may be given anti-anxiety medicines or referred to a counselor. These medicines may reduce how often attacks happen, reduce how severe the attacks are, and lower anxiety.  If the cause is a medicine, your health care provider may tell you to stop the medicine, change your dose, or take a different medicine.  If the cause is a drug, treatment may involve letting the drug wear off and taking medicine to help the drug leave your body or to counteract its effects. Attacks caused by  drug abuse may continue even if you stop using the drug.  Follow these instructions at home:  Take over-the-counter and prescription medicines only as told by your health care provider.  If you feel anxious, limit your caffeine intake.  Take good care of your physical and mental health  by: ? Eating a balanced diet that includes plenty of fresh fruits and vegetables, whole grains, lean meats, and low-fat dairy. ? Getting plenty of rest. Try to get 7-8 hours of uninterrupted sleep each night. ? Exercising regularly. Try to get 30 minutes of physical activity at least 5 days a week. ? Not smoking. Talk to your health care provider if you need help quitting. ? Limiting alcohol intake to no more than 1 drink a day for nonpregnant women and 2 drinks a day for men. One drink equals 12 oz of beer, 5 oz of wine, or 1 oz of hard liquor.  Keep all follow-up visits as told by your health care provider. This is important. Panic attacks may have underlying physical or emotional problems that take time to accurately diagnose. Contact a health care provider if:  Your symptoms do not improve, or they get worse.  You are not able to take your medicine as prescribed because of side effects. Get help right away if:  You have serious thoughts about hurting yourself or others.  You have symptoms of a panic attack. Do not drive yourself to the hospital. Have someone else drive you or call an ambulance. If you ever feel like you may hurt yourself or others, or you have thoughts about taking your own life, get help right away. You can go to your nearest emergency department or call:  Your local emergency services (911 in the [place]  A suicide crisis helpline, such as the National Suicide Prevention Lifeline at 406-144-42681-762-242-4482. This is open 24 hours a day.  Summary  A panic attack is a sign of a serious health or mental health condition. Get help right away. Do not drive yourself to the hospital. Have someone else drive you or call an ambulance.  Always see a health care provider to have the reasons for the panic attack correctly diagnosed.  If your panic attack was caused by a physical problem, follow your health care provider's suggestions for medicine, referral to a specialist, and  lifestyle changes.  If your panic attack was caused by an emotional problem, follow through with counseling from a qualified mental health specialist.  If you feel like you may hurt yourself or others, call 911 and get help right away. This information is not intended to replace advice given to you by your health care provider. Make sure you discuss any questions you have with your health care provider. Document Released: 08/06/2005 Document Revised: 09/14/2016 Document Reviewed: 09/14/2016 Elsevier Interactive Patient Education  [employer]2018 [employer]   If you have any questions or concerns, please don't hesitate to send me a message via MyChart or call the office at [phone]. Thank you for visiting with us today! It's our pleasure caring for you.  Wean your zoloft as follows:  Week one: take 50mg   (1/2 tab) daily Week two: Take 25mg  (1/4 tab) daily Week three: Take 25mg  every other day Week four: start the trintellix one tablet daily, and take 25mg  Zoloft on Tuesday and Thursday then stop it.

## 2017-09-13 ENCOUNTER — Ambulatory Visit: Payer: Medicare Other | Admitting: Family Medicine

## 2017-11-08 ENCOUNTER — Other Ambulatory Visit: Payer: Self-pay | Admitting: Family Medicine

## 2017-11-26 ENCOUNTER — Ambulatory Visit: Payer: Medicare Other | Admitting: Physician Assistant

## 2017-12-10 ENCOUNTER — Ambulatory Visit: Payer: Medicare Other | Admitting: Neurology

## 2017-12-25 ENCOUNTER — Telehealth: Payer: Self-pay | Admitting: Family Medicine

## 2017-12-25 NOTE — Telephone Encounter (Signed)
Spoke to Mrs. Newsom regarding AWV. Patient stated that she will give office a call back when she is ready to schedule her wellness appointment. SF

## 2018-05-23 ENCOUNTER — Encounter

## 2018-05-23 ENCOUNTER — Ambulatory Visit: Payer: Medicare Other | Admitting: Family Medicine

## 2018-05-29 ENCOUNTER — Ambulatory Visit: Payer: Medicare Other | Admitting: Family Medicine

## 2018-05-29 ENCOUNTER — Encounter: Payer: Self-pay | Admitting: Family Medicine

## 2018-05-29 ENCOUNTER — Other Ambulatory Visit: Payer: Self-pay

## 2018-05-29 VITALS — BP 112/62 | HR 83 | Temp 98.2°F | Ht 63.0 in | Wt 115.6 lb

## 2018-05-29 DIAGNOSIS — M8580 Other specified disorders of bone density and structure, unspecified site: Secondary | ICD-10-CM | POA: Diagnosis not present

## 2018-05-29 DIAGNOSIS — M47812 Spondylosis without myelopathy or radiculopathy, cervical region: Secondary | ICD-10-CM | POA: Insufficient documentation

## 2018-05-29 DIAGNOSIS — Z23 Encounter for immunization: Secondary | ICD-10-CM | POA: Diagnosis not present

## 2018-05-29 DIAGNOSIS — R002 Palpitations: Secondary | ICD-10-CM | POA: Diagnosis not present

## 2018-05-29 DIAGNOSIS — R5383 Other fatigue: Secondary | ICD-10-CM | POA: Diagnosis not present

## 2018-05-29 DIAGNOSIS — F5104 Psychophysiologic insomnia: Secondary | ICD-10-CM | POA: Diagnosis not present

## 2018-05-29 DIAGNOSIS — F331 Major depressive disorder, recurrent, moderate: Secondary | ICD-10-CM

## 2018-05-29 DIAGNOSIS — E039 Hypothyroidism, unspecified: Secondary | ICD-10-CM

## 2018-05-29 LAB — COMPREHENSIVE METABOLIC PANEL
ALBUMIN: 4.4 g/dL (ref 3.5–5.2)
ALK PHOS: 66 U/L (ref 39–117)
ALT: 23 U/L (ref 0–35)
AST: 25 U/L (ref 0–37)
BILIRUBIN TOTAL: 0.5 mg/dL (ref 0.2–1.2)
BUN: 14 mg/dL (ref 6–23)
CO2: 30 mEq/L (ref 19–32)
CREATININE: 0.68 mg/dL (ref 0.40–1.20)
Calcium: 9.7 mg/dL (ref 8.4–10.5)
Chloride: 102 mEq/L (ref 96–112)
GFR: 89.46 mL/min (ref >60.00)
GLUCOSE: 94 mg/dL (ref 70–99)
Potassium: 3.9 mEq/L (ref 3.5–5.1)
SODIUM: 137 meq/L (ref 135–145)
TOTAL PROTEIN: 6.5 g/dL (ref 6.0–8.3)

## 2018-05-29 LAB — TSH: TSH: 0.45 u[IU]/mL (ref 0.35–4.50)

## 2018-05-29 LAB — LIPID PANEL
CHOL/HDL RATIO: 2
Cholesterol: 191 mg/dL (ref 0–200)
HDL: 82 mg/dL (ref >39.00)
LDL CALC: 90 mg/dL (ref 0–99)
NONHDL: 108.62
Triglycerides: 94 mg/dL (ref 0.0–149.0)
VLDL: 18.8 mg/dL (ref 0.0–40.0)

## 2018-05-29 LAB — CBC WITH DIFFERENTIAL/PLATELET
BASOS ABS: 0 10*3/uL (ref 0.0–0.1)
Basophils Relative: 0.7 % (ref 0.0–3.0)
EOS PCT: 1 % (ref 0.0–5.0)
Eosinophils Absolute: 0.1 10*3/uL (ref 0.0–0.7)
HCT: 41.1 % (ref 36.0–46.0)
HEMOGLOBIN: 13.6 g/dL (ref 12.0–15.0)
LYMPHS ABS: 2 10*3/uL (ref 0.7–4.0)
Lymphocytes Relative: 37.2 % (ref 12.0–46.0)
MCHC: 33.1 g/dL (ref 30.0–36.0)
MCV: 89.2 fl (ref 78.0–100.0)
Monocytes Absolute: 0.3 10*3/uL (ref 0.1–1.0)
Monocytes Relative: 6.2 % (ref 3.0–12.0)
NEUTROS PCT: 54.9 % (ref 43.0–77.0)
Neutro Abs: 3 10*3/uL (ref 1.4–7.7)
Platelets: 239 10*3/uL (ref 150.0–400.0)
RBC: 4.61 Mil/uL (ref 3.87–5.11)
RDW: 13.6 % (ref 11.5–15.5)
WBC: 5.5 10*3/uL (ref 4.0–10.5)

## 2018-05-29 LAB — VITAMIN D 25 HYDROXY (VIT D DEFICIENCY, FRACTURES): VITD: 27.33 ng/mL — ABNORMAL LOW (ref 30.00–100.00)

## 2018-05-29 MED ORDER — DIAZEPAM 5 MG PO TABS: 5.0000 mg | ORAL_TABLET | Freq: Every day | ORAL | 2 refills | Status: DC

## 2018-05-29 NOTE — Progress Notes (Signed)
Subjective  CC:  Chief Complaint  Patient presents with  . Back Pain    wants to discuss getting a Bone Density Scan, flu shot today   . Anxiety    she states she feels anxious and her heart beats fast.     HPI: Cheryl Austin is a 77 y.o. female who presents to the office today to address the problems listed above in the chief complaint.  Upper neck and back pain: Seeing a chiropractor who is her cousin.  Reports history of recent x-rays that show significant osteoarthritis.  Does not take anything.  No radicular symptoms.  No upper extremity weakness.  Depression with anxiety: Continues to live stressful life.  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  She continues to struggle with mood endorsing symptoms of depression.  His chronic daily anxiety for which she takes Ativan at 3 PM.  Poor sleep but does mildly better on trazodone.  Has been seeing Dr. Donell Beers: Continues on Zoloft 50 mg daily but ready to try something different.  Depressive symptoms are active.  No suicidal ideation.  Low motivation.  Anxiety symptoms are high.  Hypothyroidism: Overdue for recheck.  Weight is stable.  Endorses palpitations.  See next  Palpitations: Experienced about 12 hours of flip-flopping in her chest.  Felt like her heart was going a little bit fast.  No associated shortness of breath, diaphoresis, nausea or chest pain.  Symptoms stopped abruptly about an hour ago.  She associates these feelings with anxiety.  She did not take her pulse.  No lightheadedness or dizziness.  Denies lower extremity swelling.  Health maintenance: Overdue for physical and annual wellness visit.  Due for all of osteopenia with bone density.  Patient declines breast cancer screening of any sort.  She reports she would not treat if she found it.  She does not take calcium or vitamin D.  She does not exercise.  Due for flu shot today. Assessment  1. Moderate episode of recurrent major  depressive disorder (HCC)   2. Psychophysiological insomnia   3. Acquired hypothyroidism   4. Osteopenia, unspecified location   5. Fatigue, unspecified type   6. Spondylosis of cervical region without myelopathy or radiculopathy   7. Palpitations   8. Need for immunization against influenza      Plan   Depression and anxiety: Ready to wean Zoloft and try Trintellix.  See after visit summary for instructions.  Long discussion regarding risks of long-term use of benzodiazepines.  Also I am concerned she is on short acting benzos.  Recommend changing to long-acting, decrease dosing, and then wean to off over the next 3 to 12 months.  Consider hydroxyzine, consider BuSpar.  Patient is agreeable to care plan.  Insomnia: Continue trazodone for now.  Hypothyroidism: Recheck levels today.  Could be contributing to palpitations and anxiety uncontrolled.  Patient takes medications daily  Osteopenia: Not on calcium or vitamin D.  Recheck bone density.  Discussed weightbearing exercises and supplementing calcium and vitamin D.  See after visit summary for instruction  Fatigue: Likely stress and mood related.  Check lab work.  Palpitations: EKG shows normal sinus rhythm but with first-degree AV block.  Inverted T waves in anterior leads.  Reported normal in 2017.  We will request old records.  Recommend Holter monitor and/or echocardiogram if symptoms recur.  Check labs  DJD of cervical spine: Recommend supportive care, core strengthening, Tylenol, turmeric and/or glucosamine.  High-dose flu shot given today  Follow up: Return in about 6 weeks (around 07/10/2018) for mood follow up.   Orders Placed This Encounter  Procedures  . DG Bone Density  . Flu Vaccine QUAD 36+ mos IM  . CBC with Differential/Platelet  . Comprehensive metabolic panel  . TSH  . VITAMIN D 25 Hydroxy (Vit-D Deficiency, Fractures)  . Lipid panel  . EKG 12-Lead   Meds ordered this encounter  Medications  . diazepam  (VALIUM) 5 MG tablet    Sig: Take 1 tablet (5 mg total) by mouth daily.    Dispense:  30 tablet    Refill:  2      I reviewed the patients updated PMH, FH, and SocHx.    Patient Active Problem List   Diagnosis Date Noted  . Spondylosis of cervical region without myelopathy or radiculopathy 05/29/2018  . Family history of Parkinson disease 05/21/2016  . Seborrheic dermatitis 11/02/2014  . Osteopenia 07/13/2013  . Moderate episode of recurrent major depressive disorder (HCC) 06/22/2013  . Dermatitis, atopic 01/30/2013  . Menopausal hot flushes 01/30/2013  . MRSA cellulitis 01/30/2013  . 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 Insomnia 03/18/2012  . Hypothyroidism 12/18/2010   Current Meds  Medication Sig  . levothyroxine (SYNTHROID, LEVOTHROID) 50 MCG tablet TAKE 1 TABLET BY MOUTH EVERY DAY  . sertraline (ZOLOFT) 100 MG tablet Take 100 mg by mouth daily.  . traZODone (DESYREL) 100 MG tablet Take 100 mg by mouth as needed for sleep.  . [DISCONTINUED] LORazepam (ATIVAN) 0.5 MG tablet Take 1-2 tablets by mouth daily as needed.   Health Maintenance  Topic Date Due  . INFLUENZA VACCINE  03/20/2018  . DEXA SCAN  08/02/2018 (Originally 07/27/2017)  . COLONOSCOPY  01/19/2020  . TETANUS/TDAP  06/20/2023  . PNA vac Low Risk Adult  Completed    Allergies: Patient has No Known Allergies. Family History: Patient family history includes Alcohol abuse in her brother and daughter; Asthma in her mother; Depression in her daughter and mother; Heart disease in her father; Hyperlipidemia in her father; Lupus in her mother; Parkinson's disease in her mother; Stroke in her father. Social History:  Patient  reports that she has never smoked. She has never used smokeless tobacco. She reports that she drinks alcohol. She reports that she does not use drugs.  Review of Systems: Constitutional: Negative for fever malaise or anorexia Cardiovascular: negative for chest pain Respiratory: negative for SOB  or persistent cough Gastrointestinal: negative for abdominal pain  Objective  Vitals: BP 112/62   Pulse 83   Temp 98.2 F (36.8 C)   Ht 5\' 3"  (1.6 m)   Wt 115 lb 9.6 oz (52.4 kg)   SpO2 98%   BMI 20.48 kg/m  General: no acute distress , A&Ox3 Psych: Normal affect today.  Does not appear anxious HEENT: PEERL, conjunctiva normal, Oropharynx moist,neck is supple, no thyromegaly, neck without tenderness over spinal processes, decreased rotation and lateral flexion.  Pain with full extension.  Mildly tender traps. Cardiovascular:  RRR without murmur or gallop.  No distal edema Respiratory:  Good breath sounds bilaterally, CTAB with normal respiratory effort Skin:  Warm, no rashes Neuro: No tremor EKG done.    Commons side effects, risks, benefits, and alternatives for medications and treatment plan prescribed today were discussed, and the patient expressed understanding of the given instructions. Patient is instructed to call or message via MyChart if he/she has any questions or concerns regarding our treatment plan. No barriers to understanding were identified. We discussed Red Flag  symptoms and signs in detail. Patient expressed understanding regarding what to do in case of urgent or emergency type symptoms.   Medication list was reconciled, printed and provided to the patient in AVS. Patient instructions and summary information was reviewed with the patient as documented in the AVS. This note was prepared with assistance of Dragon voice recognition software. Occasional wrong-word or sound-a-like substitutions Cheryl have occurred due to the inherent limitations of voice recognition software

## 2018-05-29 NOTE — Patient Instructions (Addendum)
Please return in 6-8 on mood medications.   Wean the zoloft as follows: Take 50 alternating with 25 for one week; Take 25 daily for one week Then start the trintellix with zoloft MWF only.  Then stop the zoloft.   I will release your lab results to you on your MyChart account with further instructions. Please reply with any questions.  We will call you with information regarding your referral appointment. [facility]. If you do not hear from [place] within the next 2 weeks, please let me know. It can take 1-2 weeks to get appointments set up with the specialists.    If you have any questions or concerns, please don't hesitate to send me a message via MyChart or call the office at [phone]. Thank you for visiting with [place] today! It's our pleasure caring for you.   Calcium Intake Recommendations You can take Caltrate Plus twice a day or get it through your diet or other OTC supplements (Viactiv, OsCal etc)  Calcium is a mineral that affects many functions in the body, including:  Blood clotting.  Blood vessel function.  Nerve impulse conduction.  Hormone secretion.  Muscle contraction.  Bone and teeth functions.  Most of your body's calcium supply is stored in your bones and teeth. When your calcium stores are low, you may be at risk for low bone mass, bone loss, and bone fractures. Consuming enough calcium helps to grow healthy bones and teeth and to prevent breakdown over time. It is very important that you get enough calcium if you are:  A child undergoing rapid growth.  An adolescent girl.  A pre- or post-menopausal woman.  A woman whose menstrual cycle has stopped due to anorexia nervosa or regular intense exercise.  An individual with lactose intolerance or a milk allergy.  A vegetarian.  What is my plan? Try to consume the recommended amount of calcium daily based on your age. Depending on your overall health, your health care provider may recommend  increased calcium intake.General daily calcium intake recommendations by age are:  Birth to 6 months: 200 mg.  Infants 7 to 12 months: 260 mg.  Children 1 to 3 years: 700 mg.  Children 4 to 8 years: 1,000 mg.  Children 9 to 13 years: 1,300 mg.  Teens 14 to 18 years: 1,300 mg.  Adults 19 to 50 years: 1,000 mg.  Adult women 51 to 70 years: 1,200 mg.  Adult men 51 to 70 years: 1,000 mg.  Adults 71 years and older: 1,200 mg.  Pregnant and breastfeeding teens: 1,300 mg.  Pregnant and breastfeeding adults: 1,000 mg.  What do I need to know about calcium intake?  In order for the body to absorb calcium, it needs vitamin D. You can get vitamin D through (we recommend getting [phone] units of Vitamin D daily) ? Direct exposure of the skin to sunlight. ? Foods, such as egg yolks, liver, saltwater fish, and fortified milk. ? Supplements.  Consuming too much calcium may cause: ? Constipation. ? Decreased absorption of iron and zinc. ? Kidney stones.  Calcium supplements may interact with certain medicines. Check with your health care provider before starting any calcium supplements.  Try to get most of your calcium from food. What foods can I eat? Grains  Fortified oatmeal. Fortified ready-to-eat cereals. Fortified frozen waffles. Vegetables Turnip greens. Broccoli. Fruits Fortified orange juice. Meats and Other Protein Sources Canned sardines with bones. Canned salmon with bones. Soy beans. Tofu. Baked beans. Almonds. [place]  nuts. Sunflower seeds. Dairy Milk. Yogurt. Cheese. Cottage cheese. Beverages Fortified soy milk. Fortified rice milk. Sweets/Desserts Pudding. Ice Cream. Milkshakes. Blackstrap molasses. The items listed above may not be a complete list of recommended foods or beverages. Contact your dietitian for more options. What foods can affect my calcium intake? It may be more difficult for your body to use calcium or calcium may leave your body more  quickly if you consume large amounts of:  Sodium.  Protein.  Caffeine.  Alcohol.  This information is not intended to replace advice given to you by your health care provider. Make sure you discuss any questions you have with your health care provider. Document Released: 03/20/2004 Document Revised: 02/24/2016 Document Reviewed: 01/12/2014 Elsevier Interactive Patient Education  2018 [employer].

## 2018-07-08 ENCOUNTER — Encounter: Payer: Self-pay | Admitting: Family Medicine

## 2018-07-08 ENCOUNTER — Ambulatory Visit (INDEPENDENT_AMBULATORY_CARE_PROVIDER_SITE_OTHER): Payer: Medicare Other | Admitting: Family Medicine

## 2018-07-08 VITALS — BP 120/72 | HR 69 | Temp 97.9°F | Ht 63.0 in | Wt 118.2 lb

## 2018-07-08 DIAGNOSIS — M8580 Other specified disorders of bone density and structure, unspecified site: Secondary | ICD-10-CM

## 2018-07-08 DIAGNOSIS — F331 Major depressive disorder, recurrent, moderate: Secondary | ICD-10-CM

## 2018-07-08 DIAGNOSIS — R002 Palpitations: Secondary | ICD-10-CM

## 2018-07-08 DIAGNOSIS — M47812 Spondylosis without myelopathy or radiculopathy, cervical region: Secondary | ICD-10-CM

## 2018-07-08 NOTE — Progress Notes (Signed)
Subjective  CC:  Chief Complaint  Patient presents with  . Depression    6 week follow-up     HPI: Cheryl Austin is a 77 y.o. female who presents to the office today to address the problems listed above in the chief complaint.  Depression follow-up: At last visit patient had worsening anxiety and depression in part situational, weaned off of Zoloft and started Trintellix.  Try to change to long-acting benzo as well.  Since, she is doing much worse.  She reports she is feeling as low as she ever has.  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  She feels another huge loss.  She continues to have anxiety.  She stopped the Valium and went back to Ativan, once daily.  She has suicidal ideation but no specific suicidal thoughts would never act on it.  Her main barrier to reacting is failure of success.  She has no close family members.  She is feeling isolated.  Neck pain is improved after chiropractor  No longer having palpitations.  I reviewed her old EKG: Her current EKG is stable.  Needs to schedule her bone density scan.  Taking vitamin D supplementation for low vitamin D. Assessment  1. Moderate episode of recurrent major depressive disorder (HCC)   2. Osteopenia, unspecified location   3. Spondylosis of cervical region without myelopathy or radiculopathy   4. Palpitations      Plan   Moderate to severe episode of depression: Counseling done.  Recommend referral to psychiatry for medication management.  Change back to short acting benzo and continue Trintellix for now.  High risk patient.  Discussed emergency symptoms and behavioral health follow-up if symptoms worsen.  Patient to call and schedule her bone density.  She is motivated to do this.  Neck pain and palpitations have improved.  No further evaluation needed at this time  Follow up: No follow-ups on file.   Orders Placed This Encounter  Procedures  . Ambulatory  referral to Psychiatry   No orders of the defined types were placed in this encounter.     I reviewed the patients updated PMH, FH, and SocHx.    Patient Active Problem List   Diagnosis Date Noted  . Spondylosis of cervical region without myelopathy or radiculopathy 05/29/2018  . Family history of Parkinson disease 05/21/2016  . Seborrheic dermatitis 11/02/2014  . Osteopenia 07/13/2013  . Moderate episode of recurrent major depressive disorder (HCC) 06/22/2013  . Dermatitis, atopic 01/30/2013  . Menopausal hot flushes 01/30/2013  . MRSA cellulitis 01/30/2013  . 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 Insomnia 03/18/2012  . Hypothyroidism 12/18/2010   Current Meds  Medication Sig  . folic acid (FOLVITE) 1 MG tablet TAKE 1 TABLET ON DAYS YOU DONT TAKE METHOTREXATE  . levothyroxine (SYNTHROID, LEVOTHROID) 50 MCG tablet TAKE 1 TABLET BY MOUTH EVERY DAY  . LORazepam (ATIVAN) 0.5 MG tablet Take 1-2 tablets by mouth daily as needed.  . sertraline (ZOLOFT) 100 MG tablet Take 100 mg by mouth daily.  . traZODone (DESYREL) 100 MG tablet Take 100 mg by mouth as needed for sleep.    Allergies: Patient has No Known Allergies. Family History: Patient family history includes Alcohol abuse in her brother and daughter; Asthma in her mother; Depression in her daughter and mother; Heart disease in her father; Hyperlipidemia in her father; Lupus in her mother; Parkinson's disease in her mother; Stroke in her father. Social History:  Patient  reports  that she has never smoked. She has never used smokeless tobacco. She reports that she drinks alcohol. She reports that she does not use drugs.  Review of Systems: Constitutional: Negative for fever malaise or anorexia Cardiovascular: negative for chest pain Respiratory: negative for SOB or persistent cough Gastrointestinal: negative for abdominal pain  Objective  Vitals: BP 120/72   Pulse 69   Temp 97.9 F (36.6 C)   Ht 5\' 3"  (1.6 m)   Wt 118 lb  3.2 oz (53.6 kg)   SpO2 98%   BMI 20.94 kg/m  General: no acute distress , A&Ox3 Psych: Well-groomed, flat affect, normal cognition, thought and speech.  Fair insight HEENT: PEERL, conjunctiva normal, Oropharynx moist,neck is supple Cardiovascular:  RRR without murmur or gallop.  Respiratory:  Good breath sounds bilaterally, CTAB with normal respiratory effort Skin:  Warm, no rashes     Commons side effects, risks, benefits, and alternatives for medications and treatment plan prescribed today were discussed, and the patient expressed understanding of the given instructions. Patient is instructed to call or message via MyChart if he/she has any questions or concerns regarding our treatment plan. No barriers to understanding were identified. We discussed Red Flag symptoms and signs in detail. Patient expressed understanding regarding what to do in case of urgent or emergency type symptoms.   Medication list was reconciled, printed and provided to the patient in AVS. Patient instructions and summary information was reviewed with the patient as documented in the AVS. This note was prepared with assistance of Dragon voice recognition software. Occasional wrong-word or sound-a-like substitutions may have occurred due to the inherent limitations of voice recognition software

## 2018-07-08 NOTE — Patient Instructions (Addendum)
Please return in 3 months recheck mood and f/u bone density scan.   If you have any questions or concerns, please don't hesitate to send me a message via MyChart or call the office at [phone]. Thank you for visiting with us today! It's our pleasure caring for you.   Please call [facility] at [phone] to schedule your bone density.   I have referred you to psychiatry. We will call you with more information.

## 2018-07-10 LAB — HM DEXA SCAN

## 2018-07-11 ENCOUNTER — Encounter: Payer: Self-pay | Admitting: Emergency Medicine

## 2018-07-21 ENCOUNTER — Encounter: Payer: Self-pay | Admitting: Family Medicine

## 2018-07-29 ENCOUNTER — Encounter: Payer: Self-pay | Admitting: Family Medicine

## 2018-07-29 ENCOUNTER — Other Ambulatory Visit: Payer: Self-pay

## 2018-07-29 ENCOUNTER — Ambulatory Visit: Payer: Medicare Other | Admitting: Family Medicine

## 2018-07-29 VITALS — BP 108/76 | HR 71 | Temp 97.9°F | Ht 63.0 in | Wt 117.4 lb

## 2018-07-29 DIAGNOSIS — N37 Urethral disorders in diseases classified elsewhere: Secondary | ICD-10-CM | POA: Diagnosis not present

## 2018-07-29 DIAGNOSIS — R350 Frequency of micturition: Secondary | ICD-10-CM | POA: Diagnosis not present

## 2018-07-29 LAB — POCT URINALYSIS DIPSTICK
Bilirubin, UA: NEGATIVE
Blood, UA: NEGATIVE
Glucose, UA: NEGATIVE
KETONES UA: NEGATIVE
Leukocytes, UA: NEGATIVE
NITRITE UA: NEGATIVE
PROTEIN UA: NEGATIVE
Urobilinogen, UA: 0.2 E.U./dL
pH, UA: 6 (ref 5.0–8.0)

## 2018-07-29 NOTE — Patient Instructions (Signed)
We will call with information on the urology appointment.

## 2018-07-29 NOTE — Progress Notes (Signed)
Subjective   CC:  Chief Complaint  Patient presents with  . Urinary Frequency    x 1 month, unsure she is has infection or need urtheral dilation     HPI: Cheryl Austin is a 77 y.o. female who presents to the office today to address the problems listed above in the chief complaint.  Patient reports urinary frequency without dysuria, gross hematuria.  She reports poor bladder emptying.  She has a long history of recurrent urethral stricture.  Stricture was due to childbirth and severe postpartum urinary tract infection.  She is had multiple dilations in the past, however due to her difficult times over the most recent years she has not had follow-up for 3 to 4 years.  No other associated symptoms.  No fevers, flank pain, nausea, lower abdominal pain. Assessment  1. 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 Frequent urination      Plan   Urethral stricture with symptoms: Refer to urologist.  Patient would prefer a female if possible.  Follow up: Return if symptoms worsen or fail to improve.  Orders Placed This Encounter  Procedures  . Ambulatory referral to Urology  . POCT Urinalysis Dipstick   No orders of the defined types were placed in this encounter.     I reviewed the patients updated PMH, FH, and SocHx.    Patient Active Problem List   Diagnosis Date Noted  . 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 Spondylosis of cervical region without myelopathy or radiculopathy 05/29/2018  . Family history of Parkinson disease 05/21/2016  . Seborrheic dermatitis 11/02/2014  . Osteopenia 07/13/2013  . Moderate episode of recurrent major depressive disorder (HCC) 06/22/2013  . Dermatitis, atopic 01/30/2013  . Menopausal hot flushes 01/30/2013  . MRSA cellulitis 01/30/2013  . 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 Insomnia 03/18/2012  . Hypothyroidism 12/18/2010   Current Meds  Medication Sig  . folic acid (FOLVITE) 1 MG tablet TAKE 1 TABLET ON DAYS YOU DONT TAKE  METHOTREXATE  . levothyroxine (SYNTHROID, LEVOTHROID) 50 MCG tablet TAKE 1 TABLET BY MOUTH EVERY DAY  . LORazepam (ATIVAN) 0.5 MG tablet Take 1-2 tablets by mouth daily as needed.  . sertraline (ZOLOFT) 100 MG tablet Take 100 mg by mouth daily.  . traZODone (DESYREL) 100 MG tablet Take 100 mg by mouth as needed for sleep.    Review of Systems: Cardiovascular: negative for chest pain Respiratory: negative for SOB or persistent cough Gastrointestinal: negative for abdominal pain Constitutional: Negative for fever malaise or anorexia  Objective  Vitals: BP 108/76   Pulse 71   Temp 97.9 F (36.6 C)   Ht 5\' 3"  (1.6 m)   Wt 117 lb 6.4 oz (53.3 kg)   SpO2 98%   BMI 20.80 kg/m  General: no acute distress  Gastrointestinal: soft, flat abdomen, normal active bowel sounds, no palpable masses, no hepatosplenomegaly, no appreciated hernias, nonpalpable bladder, nontender   Office Visit on 07/29/2018  Component Date Value Ref Range Status  . Color, UA 07/29/2018 yellow   Final  . Clarity, UA 07/29/2018 clear   Final  . Glucose, UA 07/29/2018 Negative  Negative Final  . Bilirubin, UA 07/29/2018 negative   Final  . Ketones, UA 07/29/2018 negative   Final  . Spec Grav, UA 07/29/2018 <=1.005* 1.010 - 1.025 Final  . Blood, UA 07/29/2018 negative   Final  . pH, UA 07/29/2018 6.0  5.0 - 8.0 Final  . Protein, UA 07/29/2018 Negative  Negative Final  .  Urobilinogen, UA 07/29/2018 0.2  0.2 or 1.0 E.U./dL Final  . Nitrite, UA 16/10/960412/05/2018 negative   Final  . Leukocytes, UA 07/29/2018 Negative  Negative Final    Commons side effects, risks, benefits, and alternatives for medications and treatment plan prescribed today were discussed, and the patient expressed understanding of the given instructions. Patient is instructed to call or message via MyChart if he/she has any questions or concerns regarding our treatment plan. No barriers to understanding were identified. We discussed Red Flag symptoms and  signs in detail. Patient expressed understanding regarding what to do in case of urgent or emergency type symptoms.   Medication list was reconciled, printed and provided to the patient in AVS. Patient instructions and summary information was reviewed with the patient as documented in the AVS. This note was prepared with assistance of Dragon voice recognition software. Occasional wrong-word or sound-a-like substitutions may have occurred due to the inherent limitations of voice recognition software

## 2018-08-01 ENCOUNTER — Other Ambulatory Visit: Payer: Self-pay | Admitting: Family Medicine

## 2018-10-31 ENCOUNTER — Ambulatory Visit: Payer: Medicare Other | Admitting: Family Medicine

## 2018-10-31 ENCOUNTER — Other Ambulatory Visit: Payer: Self-pay

## 2018-10-31 ENCOUNTER — Encounter: Payer: Self-pay | Admitting: Family Medicine

## 2018-10-31 VITALS — BP 120/78 | HR 81 | Temp 97.7°F | Resp 14 | Ht 63.0 in | Wt 118.8 lb

## 2018-10-31 DIAGNOSIS — F331 Major depressive disorder, recurrent, moderate: Secondary | ICD-10-CM

## 2018-10-31 NOTE — Progress Notes (Signed)
Subjective  CC:  Chief Complaint  Patient presents with  . Medication Problem    Latuda new medication for her wants to discuss  . Depression    HPI: Cheryl Austin is a 78 y.o. female who presents to the office today to address the problems listed above in the chief complaint.  Cheryl Austin presents to discuss her medications for depression.  I last saw her in December.  We have been trying to get her depression under control and changed her to Trintellix.  She is since been to a psychiatric nurse practitioner who increased her Trintellix without effect and more recently changed her to [place].  She is very skeptical and worried about possible side effects of this medication.  She has been taking it for about 11 days and feels possibly a bit better.  She has struggled with depression since the death of her husband and daughter about 3 years ago.  She continues with have anxiety.  She takes Xanax once daily midafternoon.  Her goals are to decrease side effects of medications and will ultimately want to not need medications.  She still hold strongly to trying to right the wrong of her daughter's death. Assessment  1. Moderate episode of recurrent major depressive disorder (HCC)      Plan   Today's visit was 45 minutes long. Greater than 50% of this time was devoted to face to face counseling with the patient and coordination of care. We discussed her diagnosis, prognosis, treatment options and treatment plan is documented below.    Major depression, chronic with anxiety: Counseling done.  Recommend continuing with Latuda to see if it will help break her depression.  Reassured.  Counseled regarding different strategies to help.  Continue seeing psychiatrist.  Follow up: 1 to 3 months for recheck Visit date not found  No orders of the defined types were placed in this encounter.  No orders of the defined types were placed in this encounter.     I reviewed the patients updated PMH, FH, and  SocHx.    Patient Active Problem List   Diagnosis Date Noted  . 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 Spondylosis of cervical region without myelopathy or radiculopathy 05/29/2018  . Family history of Parkinson disease 05/21/2016  . Seborrheic dermatitis 11/02/2014  . Osteopenia 07/13/2013  . Moderate episode of recurrent major depressive disorder (HCC) 06/22/2013  . Dermatitis, atopic 01/30/2013  . Menopausal hot flushes 01/30/2013  . MRSA cellulitis 01/30/2013  . 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 Insomnia 03/18/2012  . Hypothyroidism 12/18/2010   Current Meds  Medication Sig  . levothyroxine (SYNTHROID, LEVOTHROID) 50 MCG tablet TAKE 1 TABLET BY MOUTH EVERY DAY  . LORazepam (ATIVAN) 0.5 MG tablet Take 1-2 tablets by mouth daily as needed.  . lurasidone (LATUDA) 40 MG TABS tablet Take 40 mg by mouth daily with breakfast.  . traZODone (DESYREL) 100 MG tablet Take 100 mg by mouth as needed for sleep.    Allergies: Patient has No Known Allergies. Family History: Patient family history includes Alcohol abuse in her brother and daughter; Asthma in her mother; Depression in her daughter and mother; Heart disease in her father; Hyperlipidemia in her father; Lupus in her mother; Parkinson's disease in her mother; Stroke in her father. Social History:  Patient  reports that she has never smoked. She has never used smokeless tobacco. She reports current alcohol use. She reports that she does not use drugs.  Review of Systems: Constitutional: Negative for fever  malaise or anorexia Cardiovascular: negative for chest pain Respiratory: negative for SOB or persistent cough Gastrointestinal: negative for abdominal pain  Objective  Vitals: BP 120/78   Pulse 81   Temp 97.7 F (36.5 C) (Oral)   Resp 14   Ht 5\' 3"  (1.6 m)   Wt 118 lb 12.8 oz (53.9 kg)   BMI 21.04 kg/m  General: no acute distress , A&Ox3 Psych: Normal speech, tearful at times.  Affect appropriate    Commons side effects, risks, benefits, and alternatives for medications and treatment plan prescribed today were discussed, and the patient expressed understanding of the given instructions. Patient is instructed to call or message via MyChart if he/she has any questions or concerns regarding our treatment plan. No barriers to understanding were identified. We discussed Red Flag symptoms and signs in detail. Patient expressed understanding regarding what to do in case of urgent or emergency type symptoms.   Medication list was reconciled, printed and provided to the patient in AVS. Patient instructions and summary information was reviewed with the patient as documented in the AVS. This note was prepared with assistance of Dragon voice recognition software. Occasional wrong-word or sound-a-like substitutions may have occurred due to the inherent limitations of voice recognition software

## 2018-10-31 NOTE — Patient Instructions (Signed)
Eckart Tolle's [employer].  Oprah's podcast: march 2019: [employer]

## 2018-11-01 ENCOUNTER — Encounter: Payer: Self-pay | Admitting: Family Medicine

## 2018-11-11 ENCOUNTER — Ambulatory Visit: Payer: Medicare Other | Admitting: Family Medicine

## 2018-11-12 ENCOUNTER — Encounter: Payer: Self-pay | Admitting: Family Medicine

## 2018-11-12 ENCOUNTER — Ambulatory Visit (INDEPENDENT_AMBULATORY_CARE_PROVIDER_SITE_OTHER): Payer: Medicare Other | Admitting: Family Medicine

## 2018-11-12 ENCOUNTER — Other Ambulatory Visit: Payer: Self-pay

## 2018-11-12 DIAGNOSIS — F331 Major depressive disorder, recurrent, moderate: Secondary | ICD-10-CM | POA: Diagnosis not present

## 2018-11-12 DIAGNOSIS — F5104 Psychophysiologic insomnia: Secondary | ICD-10-CM | POA: Diagnosis not present

## 2018-11-12 DIAGNOSIS — E039 Hypothyroidism, unspecified: Secondary | ICD-10-CM

## 2018-11-12 MED ORDER — LORAZEPAM 0.5 MG PO TABS: ORAL_TABLET | ORAL | 2 refills | Status: AC

## 2018-11-12 MED ORDER — LEVOTHYROXINE SODIUM 50 MCG PO TABS: 50.0000 ug | ORAL_TABLET | Freq: Every day | ORAL | 3 refills | Status: DC

## 2018-11-12 NOTE — Progress Notes (Signed)
I have discussed the procedure for the virtual visit with the patient who has given consent to proceed with assessment and treatment.   Tiara S Simmons, CMA     

## 2018-11-12 NOTE — Progress Notes (Signed)
TELEPHONE ENCOUNTER   Patient verbally agreed to telephone visit and is aware that copayment and coinsurance may apply. Patient was treated using telemedicine according to accepted telemedicine protocols.  Location of the patient: home Location of provider: [facility] Names of all persons participating in the telemedicine service and role in the encounter: [name], MD Rita Ohara, CMA   Subjective  CC:  Chief Complaint  Patient presents with  . Medication Problem    Latuda. On 11/02/18 she took 1/ 2 pill and after that she has not taken anymore. Wants to discuss another medication  . Insomnia    Is taking Trazodone, working well.. She has a f/u the 2nd week in April with Dr. Boris Sharper and is needing a refill on Lorazepam to last until then  . Hypothyroidism    Needs refills on Levothyroxine    HPI: Cheryl Austin is a 78 y.o. female who presents to the office today to address the problems listed above in the chief complaint.  Latuda: stopped due to side effect with disruption of sleep, foggy headed, fast speech and felt terrible. Stopped it. doesn't want anymore antidepressants. Taking ativan once a day and sleeping pill. Feels great now! Working hard on Pharmacist, hospital.  Needs refill on ativan. Uses once in the afternoon.   Hypothyroidism: due for refill.  Lab Results  Component Value Date   TSH 0.45 05/29/2018    Assessment  1. Moderate episode of recurrent major depressive disorder (HCC)   2. Psychophysiological insomnia   3. Acquired hypothyroidism      Plan   depression:  Off antidepressants. Has f/u with Dr. Donell Beers. Continue anxiety med for daily use. See what psych recommends.   Discussed behavioral management strategies including spiritual wellbeing. To read "[employer]".   Refilled thyroid meds.  Time spent with the patient: 12 minutes, spent in obtaining information about her symptoms, reviewing her previous labs,  evaluations, and treatments, counseling her about her condition (please see the discussed topics above), and developing a plan to further investigate it; she had a number of questions which I addressed.  Follow up: Return in about 3 months (around 02/12/2019) for recheck.  Visit date not found  No orders of the defined types were placed in this encounter.  Meds ordered this encounter  Medications  . LORazepam (ATIVAN) 0.5 MG tablet    Sig: Take 1-2 tablets by mouth daily as needed.    Dispense:  30 tablet    Refill:  2  . levothyroxine (SYNTHROID, LEVOTHROID) 50 MCG tablet    Sig: Take 1 tablet (50 mcg total) by mouth daily.    Dispense:  90 tablet    Refill:  3      I reviewed the patients updated PMH, FH, and SocHx.    Patient Active Problem List   Diagnosis Date Noted  . 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 Spondylosis of cervical region without myelopathy or radiculopathy 05/29/2018  . Family history of Parkinson disease 05/21/2016  . Seborrheic dermatitis 11/02/2014  . Osteopenia 07/13/2013  . Moderate episode of recurrent major depressive disorder (HCC) 06/22/2013  . Dermatitis, atopic 01/30/2013  . Menopausal hot flushes 01/30/2013  . MRSA cellulitis 01/30/2013  . 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 Insomnia 03/18/2012  . Hypothyroidism 12/18/2010   Current Meds  Medication Sig  . levothyroxine (SYNTHROID, LEVOTHROID) 50 MCG tablet Take 1 tablet (50 mcg total) by mouth daily.  Marland Kitchen LORazepam (ATIVAN) 0.5 MG tablet  Take 1-2 tablets by mouth daily as needed.  . traZODone (DESYREL) 100 MG tablet Take 100 mg by mouth as needed for sleep.  . [DISCONTINUED] levothyroxine (SYNTHROID, LEVOTHROID) 50 MCG tablet TAKE 1 TABLET BY MOUTH EVERY DAY  . [DISCONTINUED] LORazepam (ATIVAN) 0.5 MG tablet Take 1-2 tablets by mouth daily as needed.    Allergies: Patient has No Known Allergies. Family History: Patient family history includes Alcohol abuse in her brother and  daughter; Asthma in her mother; Depression in her daughter and mother; Heart disease in her father; Hyperlipidemia in her father; Lupus in her mother; Parkinson's disease in her mother; Stroke in her father. Social History:  Patient  reports that she has never smoked. She has never used smokeless tobacco. She reports current alcohol use. She reports that she does not use drugs.  Review of Systems: Constitutional: Negative for fever malaise or anorexia Cardiovascular: negative for chest pain Respiratory: negative for SOB or persistent cough Gastrointestinal: negative for abdominal pain     Commons side effects, risks, benefits, and alternatives for medications and treatment plan prescribed today were discussed, and the patient expressed understanding of the given instructions. Patient is instructed to call or message via MyChart if he/she has any questions or concerns regarding our treatment plan. No barriers to understanding were identified. We discussed Red Flag symptoms and signs in detail. Patient expressed understanding regarding what to do in case of urgent or emergency type symptoms.   Medication list was reconciled, printed and provided to the patient in AVS. Patient instructions and summary information was reviewed with the patient as documented in the AVS. This note was prepared with assistance of Dragon voice recognition software. Occasional wrong-word or sound-a-like substitutions may have occurred due to the inherent limitations of voice recognition software     [phone] physician/qualified health professional telephone evaluation 5 to 10 minutes [phone] physician/qualified help functional [place] evaluation for 11 to 20 minutes [phone] physician/qualify he will professional telephone evaluation for 21 to 30 minutes

## 2019-03-25 ENCOUNTER — Ambulatory Visit: Payer: Medicare Other | Admitting: Family Medicine

## 2019-03-25 ENCOUNTER — Ambulatory Visit (INDEPENDENT_AMBULATORY_CARE_PROVIDER_SITE_OTHER): Payer: Medicare Other

## 2019-03-25 ENCOUNTER — Other Ambulatory Visit: Payer: Self-pay

## 2019-03-25 ENCOUNTER — Encounter: Payer: Self-pay | Admitting: Family Medicine

## 2019-03-25 VITALS — BP 114/66 | HR 70 | Temp 98.2°F | Resp 14 | Ht 63.0 in | Wt 114.0 lb

## 2019-03-25 DIAGNOSIS — M545 Low back pain: Secondary | ICD-10-CM

## 2019-03-25 DIAGNOSIS — G8929 Other chronic pain: Secondary | ICD-10-CM

## 2019-03-25 DIAGNOSIS — R413 Other amnesia: Secondary | ICD-10-CM | POA: Diagnosis not present

## 2019-03-25 DIAGNOSIS — F331 Major depressive disorder, recurrent, moderate: Secondary | ICD-10-CM | POA: Diagnosis not present

## 2019-03-25 NOTE — Patient Instructions (Addendum)
Please return in October for your annual complete physical; please come fasting. Please also schedule an Annual Wellness visit with courtney for a cognitive screening.   We will call you with information regarding your referral appointment. Physical therapy.  If you do not hear from [place] within the next 2 weeks, please let me know. If you have any questions or concerns, please don't hesitate to send me a message via MyChart or call the office at [phone]. Thank you for visiting with [place] today! It's our pleasure caring for you.   Back Exercises The following exercises strengthen the muscles that help to support the trunk and back. They also help to keep the lower back flexible. Doing these exercises can help to prevent back pain or lessen existing pain.  If you have back pain or discomfort, try doing these exercises 2-3 times each day or as told by your health care provider.  As your pain improves, do them once each day, but increase the number of times that you repeat the steps for each exercise (do more repetitions).  To prevent the recurrence of back pain, continue to do these exercises once each day or as told by your health care provider. Do exercises exactly as told by your health care provider and adjust them as directed. It is normal to feel mild stretching, pulling, tightness, or discomfort as you do these exercises, but you should stop right away if you feel sudden pain or your pain gets worse. Exercises Single knee to chest Repeat these steps 3-5 times for each leg: 1. Lie on your back on a firm bed or the floor with your legs extended. 2. Bring one knee to your chest. Your other leg should stay extended and in contact with the floor. 3. Hold your knee in place by grabbing your knee or thigh with both hands and hold. 4. Pull on your knee until you feel a gentle stretch in your lower back or buttocks. 5. Hold the stretch for 10-30 seconds. 6. Slowly release and straighten your  leg. Pelvic tilt Repeat these steps 5-10 times: 1. Lie on your back on a firm bed or the floor with your legs extended. 2. Bend your knees so they are pointing toward the ceiling and your feet are flat on the floor. 3. Tighten your lower abdominal muscles to press your lower back against the floor. This motion will tilt your pelvis so your tailbone points up toward the ceiling instead of pointing to your feet or the floor. 4. With gentle tension and even breathing, hold this position for 5-10 seconds. Cat-cow Repeat these steps until your lower back becomes more flexible: 1. Get into a hands-and-knees position on a firm surface. Keep your hands under your shoulders, and keep your knees under your hips. You may place padding under your knees for comfort. 2. Let your head hang down toward your chest. Contract your abdominal muscles and point your tailbone toward the floor so your lower back becomes rounded like the back of a cat. 3. Hold this position for 5 seconds. 4. Slowly lift your head, let your abdominal muscles relax and point your tailbone up toward the ceiling so your back forms a sagging arch like the back of a cow. 5. Hold this position for 5 seconds.  Press-ups Repeat these steps 5-10 times: 1. Lie on your abdomen (face-down) on the floor. 2. Place your palms near your head, about shoulder-width apart. 3. Keeping your back as relaxed as possible and keeping your  hips on the floor, slowly straighten your arms to raise the top half of your body and lift your shoulders. Do not use your back muscles to raise your upper torso. You may adjust the placement of your hands to make yourself more comfortable. 4. Hold this position for 5 seconds while you keep your back relaxed. 5. Slowly return to lying flat on the floor.  Bridges Repeat these steps 10 times: 1. Lie on your back on a firm surface. 2. Bend your knees so they are pointing toward the ceiling and your feet are flat on the floor.  Your arms should be flat at your sides, next to your body. 3. Tighten your buttocks muscles and lift your buttocks off the floor until your waist is at almost the same height as your knees. You should feel the muscles working in your buttocks and the back of your thighs. If you do not feel these muscles, slide your feet 1-2 inches farther away from your buttocks. 4. Hold this position for 3-5 seconds. 5. Slowly lower your hips to the starting position, and allow your buttocks muscles to relax completely. If this exercise is too easy, try doing it with your arms crossed over your chest. Abdominal crunches Repeat these steps 5-10 times: 1. Lie on your back on a firm bed or the floor with your legs extended. 2. Bend your knees so they are pointing toward the ceiling and your feet are flat on the floor. 3. Cross your arms over your chest. 4. Tip your chin slightly toward your chest without bending your neck. 5. Tighten your abdominal muscles and slowly raise your trunk (torso) high enough to lift your shoulder blades a tiny bit off the floor. Avoid raising your torso higher than that because it can put too much stress on your low back and does not help to strengthen your abdominal muscles. 6. Slowly return to your starting position. Back lifts Repeat these steps 5-10 times: 1. Lie on your abdomen (face-down) with your arms at your sides, and rest your forehead on the floor. 2. Tighten the muscles in your legs and your buttocks. 3. Slowly lift your chest off the floor while you keep your hips pressed to the floor. Keep the back of your head in line with the curve in your back. Your eyes should be looking at the floor. 4. Hold this position for 3-5 seconds. 5. Slowly return to your starting position. Contact a health care provider if:  Your back pain or discomfort gets much worse when you do an exercise.  Your worsening back pain or discomfort does not lessen within 2 hours after you exercise. If  you have any of these problems, stop doing these exercises right away. Do not do them again unless your health care provider says that you can. Get help right away if:  You develop sudden, severe back pain. If this happens, stop doing the exercises right away. Do not do them again unless your health care provider says that you can. This information is not intended to replace advice given to you by your health care provider. Make sure you discuss any questions you have with your health care provider. Document Released: 09/13/2004 Document Revised: 12/11/2018 Document Reviewed: 05/08/2018 Elsevier Patient Education  2020 [employer]

## 2019-03-25 NOTE — Progress Notes (Signed)
Subjective  CC:  Chief Complaint  Patient presents with  . Back Pain    Comes and goes.. Lifting makes worse.Marland Kitchen. Has been going to Chiropractor and had xrays, he has been doing adjustments but pain still comes and goes    HPI: Cheryl KurtzJoanne Austin is a 78 y.o. female who presents to the office today to address the problems listed above in the chief complaint.  C/o 6 months of intermittent LBP; treated by her cousin, Land[facility], weekly. Mild to moderate pain with lifting at times. Lasts hours to a day or so. Ibuprofen helps. No radicular sxs. Was told had "blown disc". Has known OA in neck and hands. No pain now. Normal gait. No LE sxs. No B/B sxs.   Depression - stable. Coping ok. Still just getting by but on meds and sees psych  Worries a little about memory: at times feels forgetful, ex: wants to do something but forgets to do it.    Assessment  1. Chronic midline low back pain without sciatica   2. Memory change   3. Moderate episode of recurrent major depressive disorder (HCC)      Plan   lbp:  Start PT, check xrays. Start back strengthening once cleared by PT  Memory concerns: suspect more related to depression and apathy with poor focus rather than cog impairment but will start wit AWV and cog screening pt agrees.   Depression: continue same: continue psych.   Follow up: Return in about 3 months (around 06/25/2019) for complete physical, AWV at patient's convenience.  Visit date not found  Orders Placed This Encounter  Procedures  . DG Lumbar Spine Complete  . Ambulatory referral to Physical Therapy   No orders of the defined types were placed in this encounter.     I reviewed the patients updated PMH, FH, and SocHx.    Patient Active Problem List   Diagnosis Date Noted  . 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 Spondylosis of cervical region without myelopathy or radiculopathy 05/29/2018  . Family history of Parkinson disease 05/21/2016  . Seborrheic  dermatitis 11/02/2014  . Osteopenia 07/13/2013  . Moderate episode of recurrent major depressive disorder (HCC) 06/22/2013  . Dermatitis, atopic 01/30/2013  . Menopausal hot flushes 01/30/2013  . MRSA cellulitis 01/30/2013  . 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 Insomnia 03/18/2012  . Hypothyroidism 12/18/2010   Current Meds  Medication Sig  . levothyroxine (SYNTHROID, LEVOTHROID) 50 MCG tablet Take 1 tablet (50 mcg total) by mouth daily.  Marland Kitchen. LORazepam (ATIVAN) 0.5 MG tablet Take 1-2 tablets by mouth daily as needed.  . sertraline (ZOLOFT) 100 MG tablet Take 1 tablet by mouth daily.  . traZODone (DESYREL) 100 MG tablet Take 100 mg by mouth as needed for sleep.    Allergies: Patient has No Known Allergies. Family History: Patient family history includes Alcohol abuse in her brother and daughter; Asthma in her mother; Depression in her daughter and mother; Heart disease in her father; Hyperlipidemia in her father; Lupus in her mother; Parkinson's disease in her mother; Stroke in her father. Social History:  Patient  reports that she has never smoked. She has never used smokeless tobacco. She reports current alcohol use. She reports that she does not use drugs.  Review of Systems: Constitutional: Negative for fever malaise or anorexia Cardiovascular: negative for chest pain Respiratory: negative for SOB or persistent cough Gastrointestinal: negative for abdominal pain  Objective  Vitals: BP 114/66   Pulse 70   Temp 98.2 F (  36.8 C) (Tympanic)   Resp 14   Ht 5\' 3"  (1.6 m)   Wt 114 lb (51.7 kg)   SpO2 98%   BMI 20.19 kg/m  General: no acute distress , A&Ox3 HEENT: PEERL, conjunctiva normal, Oropharynx moist,neck is supple Cardiovascular:  RRR without murmur or gallop.  Respiratory:  Good breath sounds bilaterally, CTAB with normal respiratory effort Skin:  Warm, no rashes Back: normal exam.      Commons side effects, risks, benefits, and alternatives for medications and  treatment plan prescribed today were discussed, and the patient expressed understanding of the given instructions. Patient is instructed to call or message via MyChart if he/she has any questions or concerns regarding our treatment plan. No barriers to understanding were identified. We discussed Red Flag symptoms and signs in detail. Patient expressed understanding regarding what to do in case of urgent or emergency type symptoms.   Medication list was reconciled, printed and provided to the patient in AVS. Patient instructions and summary information was reviewed with the patient as documented in the AVS. This note was prepared with assistance of Dragon voice recognition software. Occasional wrong-word or sound-a-like substitutions may have occurred due to the inherent limitations of voice recognition software

## 2019-03-31 ENCOUNTER — Other Ambulatory Visit: Payer: Self-pay

## 2019-03-31 ENCOUNTER — Ambulatory Visit (INDEPENDENT_AMBULATORY_CARE_PROVIDER_SITE_OTHER): Payer: Medicare Other | Admitting: Physical Therapy

## 2019-03-31 ENCOUNTER — Encounter: Payer: Self-pay | Admitting: Physical Therapy

## 2019-03-31 DIAGNOSIS — G8929 Other chronic pain: Secondary | ICD-10-CM | POA: Diagnosis not present

## 2019-03-31 DIAGNOSIS — M545 Low back pain: Secondary | ICD-10-CM | POA: Diagnosis not present

## 2019-03-31 NOTE — Patient Instructions (Signed)
Access Code: [email or web address]  URL: [email or web address]  Date: 03/31/2019  Prepared by: Lyndee Hensen   Exercises Single Knee to Chest Stretch - 3 reps - 30 hold - 2x daily Supine Piriformis Stretch - 3 reps - 30 hold - 2x daily Childs Pose - 3 reps - 30 hold - 2x daily Seated Figure 4 Piriformis Stretch - 3 reps - 30 hold - 2x daily Seated Hamstring Stretch - 3 reps - 30 hold - 2x daily

## 2019-03-31 NOTE — Therapy (Signed)
[facility] [address] [place], [place], [address] Phone: [phone]   Fax:  [phone]  Physical Therapy Evaluation  Patient Details  Name: Cheryl Austin MRN: [id number] Date of Birth: Jan 05, 1941 Referring Provider (PT): Asencion Partridgeamille Andy   Encounter Date: 03/31/2019  PT End of Session - 03/31/19 1536    Visit Number  1    Number of Visits  6    Date for PT Re-Evaluation  05/12/19    Authorization Type  [employer]    PT Start Time  1300    PT Stop Time  1337    PT Time Calculation (min)  37 min    Activity Tolerance  Patient tolerated treatment well    Behavior During Therapy  [facility] for tasks assessed/performed       Past Medical History:  Diagnosis Date  . Anxiety   . 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 Family history of Parkinson disease 05/21/2016   Mother  . GERD (gastroesophageal reflux disease)   . Kidney failure    blood transfusion  . Moderate episode of recurrent major depressive disorder (HCC)   . Osteoporosis   . Restless legs syndrome (RLS) 08/02/2017  . Thyroid disease    Hypothyroid    Past Surgical History:  Procedure Laterality Date  . APPENDECTOMY  1961  . BREAST SURGERY Bilateral 1987 1998   Lumpectomy  . EYE SURGERY    . TOTAL ABDOMINAL HYSTERECTOMY  1986   BSO/Fibroids    There were no vitals filed for this visit.   Subjective Assessment - 03/31/19 1255    Subjective  Pt states increased pain in low back for years. States increased with bending, lifting. She also goes to chiropractor 1x/wk consistently.She does not have any pain today, but does typically have pain a few days a week, based on what she is doing. She wears a back brace daily. Has never seen Ortho or had any imaging other than recentx-ray ( showing degeneration)    Pertinent History  Osteoporosis    Limitations  Lifting;House hold activities    Currently in Pain?  Yes    Pain Score  8      Pain Location  Back    Pain Orientation  Left;Right    Pain Descriptors / Indicators  Aching    Pain Type  Chronic pain    Pain Onset  More than a month ago    Pain Frequency  Intermittent    Effect of Pain on Daily Activities  Pain may be up to 8/10 with or after certain activity. Today she has none.         [facility] - 03/31/19 0001      Assessment   Medical Diagnosis  Back Pain    Referring Provider (PT)  Asencion Partridgeamille Andy    Prior Therapy  no      Balance Screen   Has the patient fallen in the past 6 months  No      Prior Function   Level of Independence  Independent      Cognition   Overall Cognitive Status  Within Functional Limits for tasks assessed      Posture/Postural Control   Posture Comments  seated posture: WNL;  Standing: R hip higher , L hip lower ; Supine: R femur longer      ROM / Strength   AROM / PROM / Strength  AROM;Strength  AROM   Overall AROM Comments  lumbar: mild limitations for Bil SB;  Hips: [facility]      Strength   Overall Strength Comments  Core: 3/5, Hips: 4/5       Palpation   Palpation comment  No pain to palapte lumbar or glute region today. Mild tightness in bil paraspinals.       Special Tests   Other special tests  Neg SLR Bil.      Ambulation/Gait   Gait Comments  Increased hip rotation and low lumbar rotation bilaterally                 Objective measurements completed on examination: See above findings.      [facility] Adult PT Treatment/Exercise - 03/31/19 0001      Exercises   Exercises  Lumbar      Lumbar Exercises: Stretches   Active Hamstring Stretch  3 reps;30 seconds    Active Hamstring Stretch Limitations  seated             PT Education - 03/31/19 1536    Education Details  HEP, PT POC, Exam findings.    Person(s) Educated  Patient    Methods  Explanation;Demonstration;Handout    Comprehension  Verbalized understanding;Returned demonstration;Verbal cues required;Need further  instruction          PT Long Term Goals - 03/31/19 1538      PT LONG TERM GOAL #1   Title  Pt to be independent with final HEP    Time  6    Period  Weeks    Status  New    Target Date  05/12/19      PT LONG TERM GOAL #2   Title  Pt to demo improved posture with squat, lift position, to improve ability and safety with IADLS.    Time  6    Period  Weeks    Target Date  05/12/19      PT LONG TERM GOAL #3   Title  Pt to report decreased pain in low back to <4/10 with standing and housework.    Time  6    Period  Weeks    Target Date  05/12/19             Plan - 03/31/19 1542    Clinical Impression Statement  Pt presents with primary complaint of increased pain in low back that has been chronic and variable. Pt with no pain today, but has increased pain with bending, lifting, squatting, and housework. She would like to get back to exercising, but wants to make sure she is doing the right things for her back. She has decent ROM today, and has minimal pain to test or palpate. Pt given basic stretches for HEP, will benefit from a few additional sessions to teach strengthening HEP. Pt also to benefit from education on squat, bend, lifting posture for IADLS. Pt with decereased ability for full functional activities, due to pain. Pt to benefit from skilled PT to improve deficits.    Personal Factors and Comorbidities  Comorbidity 1;Time since onset of injury/illness/exacerbation    Comorbidities  Osteoporosis,    Examination-Activity Limitations  Locomotion Level;Bend;Carry;Squat;Stairs;Stand;Lift    Examination-Participation Restrictions  Cleaning;Community Activity;Laundry;Yard Work    Stability/Clinical Decision Making  Stable/Uncomplicated    Clinical Decision Making  Low    Rehab Potential  Good    PT Frequency  1x / week    PT Duration  6 weeks    PT  Treatment/Interventions  ADLs/Self Care Home Management;Cryotherapy;Electrical Stimulation;Ultrasound;Traction;Moist  Heat;Iontophoresis 4mg /ml Dexamethasone;Gait training;Stair training;Functional mobility training;Therapeutic activities;Therapeutic exercise;Balance training;Neuromuscular re-education;Manual techniques;Patient/family education;Passive range of motion;Dry needling;Joint Manipulations;Spinal Manipulations;Taping    Consulted and Agree with Plan of Care  Patient       Patient will benefit from skilled therapeutic intervention in order to improve the following deficits and impairments:  Abnormal gait, Decreased range of motion, Increased muscle spasms, Decreased endurance, Decreased activity tolerance, Pain, Improper body mechanics, Impaired flexibility, Decreased mobility, Decreased strength  Visit Diagnosis: 1. Chronic bilateral low back pain without sciatica        Problem List Patient Active Problem List   Diagnosis Date Noted  . 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 Spondylosis of cervical region without myelopathy or radiculopathy 05/29/2018  . Family history of Parkinson disease 05/21/2016  . Seborrheic dermatitis 11/02/2014  . Osteopenia 07/13/2013  . Moderate episode of recurrent major depressive disorder (HCC) 06/22/2013  . Dermatitis, atopic 01/30/2013  . Menopausal hot flushes 01/30/2013  . MRSA cellulitis 01/30/2013  . 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 Insomnia 03/18/2012  . Hypothyroidism 12/18/2010   Sedalia MutaLauren Kiffany Schelling, PT, DPT 3:50 PM  03/31/19    [facility] [address] [place], [place], [address] Phone: [phone]   Fax:  [phone]  Name: Cheryl Austin MRN: [id number] Date of Birth: Aug 24, 1940

## 2019-03-31 NOTE — Addendum Note (Signed)
Addended by: Lyndee Hensen on: 03/31/2019 03:52 PM   Modules accepted: Orders

## 2019-04-06 ENCOUNTER — Encounter: Payer: Medicare Other | Admitting: Physical Therapy

## 2019-04-08 ENCOUNTER — Ambulatory Visit (INDEPENDENT_AMBULATORY_CARE_PROVIDER_SITE_OTHER): Payer: Medicare Other | Admitting: Family Medicine

## 2019-04-08 ENCOUNTER — Encounter: Payer: Self-pay | Admitting: Family Medicine

## 2019-04-08 DIAGNOSIS — J208 Acute bronchitis due to other specified organisms: Secondary | ICD-10-CM

## 2019-04-08 DIAGNOSIS — Z20828 Contact with and (suspected) exposure to other viral communicable diseases: Secondary | ICD-10-CM | POA: Diagnosis not present

## 2019-04-08 DIAGNOSIS — Z20822 Contact with and (suspected) exposure to covid-19: Secondary | ICD-10-CM

## 2019-04-08 MED ORDER — BENZONATATE 100 MG PO CAPS: 100.0000 mg | ORAL_CAPSULE | Freq: Two times a day (BID) | ORAL | 0 refills | Status: DC | PRN

## 2019-04-08 NOTE — Progress Notes (Signed)
Virtual Visit via Video Note  Subjective  CC:  Chief Complaint  Patient presents with  . Cough    Started over the weekend, cough is dry.. Has an old RX of Tessalon Pearls that she has been using with relief and Catering managerAlka Seltzer  . Fatigue  . Sneezing     I connected with Cheryl Austin on 04/08/19 at  3:20 PM EDT by a video enabled telemedicine application and verified that I am speaking with the correct person using two identifiers. Location patient: Home Location provider: [facility] [address], [address] Persons participating in the virtual visit: Cheryl KurtzJoanne Austin, Willow Oraamille L Sofia Jaquith, MD Rita Oharaiara Simmons, CMA  I discussed the limitations of evaluation and management by telemedicine and the availability of in person appointments. The patient expressed understanding and agreed to proceed. HPI: Cheryl Austin is a 78 y.o. female who was contacted today to address the problems listed above in the chief complaint. Glenford Peers. Uri sxs started 3-4 days ago: no fever but dry cough, malaise, mild nausea, congestion; improving now but cough persisting and relieved with tessalon perles. No loss of taste or smell. No n/v/d or sob. No sinus pain. Energy is improved. Appetite is stable. No known exposure to covid but has been out of the house a fair amount recently.  Assessment  1. Viral bronchitis   2. Exposure to Covid-19 Virus      Plan   Likely viral illness but will test for novel coronavirus, support with tessalon. Education given on testing and interpretation of results and recs regarding self isolation and monitoring sxs. I discussed the assessment and treatment plan with the patient. The patient was provided an opportunity to ask questions and all were answered. The patient agreed with the plan and demonstrated an understanding of the instructions.   The patient was advised to call back or seek an in-person evaluation if the symptoms worsen or if the condition fails to improve as  anticipated. Follow up: No follow-ups on file.  04/13/2019  Meds ordered this encounter  Medications  . benzonatate (TESSALON) 100 MG capsule    Sig: Take 1 capsule (100 mg total) by mouth 2 (two) times daily as needed for cough.    Dispense:  20 capsule    Refill:  0      I reviewed the patients updated PMH, FH, and SocHx.    Patient Active Problem List   Diagnosis Date Noted  . 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 Spondylosis of cervical region without myelopathy or radiculopathy 05/29/2018  . Family history of Parkinson disease 05/21/2016  . Seborrheic dermatitis 11/02/2014  . Osteopenia 07/13/2013  . Moderate episode of recurrent major depressive disorder (HCC) 06/22/2013  . Dermatitis, atopic 01/30/2013  . Menopausal hot flushes 01/30/2013  . MRSA cellulitis 01/30/2013  . 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 Insomnia 03/18/2012  . Hypothyroidism 12/18/2010   Current Meds  Medication Sig  . levothyroxine (SYNTHROID, LEVOTHROID) 50 MCG tablet Take 1 tablet (50 mcg total) by mouth daily.  Marland Kitchen. LORazepam (ATIVAN) 0.5 MG tablet Take 1-2 tablets by mouth daily as needed.  . sertraline (ZOLOFT) 100 MG tablet Take 1 tablet by mouth daily.  . traZODone (DESYREL) 100 MG tablet Take 100 mg by mouth as needed for sleep.    Allergies: Patient has No Known Allergies. Family History: Patient family history includes Alcohol abuse in her brother and daughter; Asthma in her mother; Depression in her daughter and mother; Heart disease in  her father; Hyperlipidemia in her father; Lupus in her mother; Parkinson's disease in her mother; Stroke in her father. Social History:  Patient  reports that she has never smoked. She has never used smokeless tobacco. She reports current alcohol use. She reports that she does not use drugs.  Review of Systems: Constitutional: Negative for fever malaise or anorexia Cardiovascular: negative for chest pain Respiratory: negative for SOB or  persistent cough Gastrointestinal: negative for abdominal pain  OBJECTIVE Vitals: There were no vitals taken for this visit. General: no acute distress , A&Ox3 Appears well, mild cough Leamon Arnt, MD

## 2019-04-09 ENCOUNTER — Other Ambulatory Visit: Payer: Self-pay

## 2019-04-09 DIAGNOSIS — Z20822 Contact with and (suspected) exposure to covid-19: Secondary | ICD-10-CM

## 2019-04-10 LAB — NOVEL CORONAVIRUS, NAA: SARS-CoV-2, NAA: NOT DETECTED

## 2019-04-13 ENCOUNTER — Encounter: Payer: Self-pay | Admitting: Physical Therapy

## 2019-04-13 ENCOUNTER — Other Ambulatory Visit: Payer: Self-pay

## 2019-04-13 ENCOUNTER — Ambulatory Visit (INDEPENDENT_AMBULATORY_CARE_PROVIDER_SITE_OTHER): Payer: Medicare Other | Admitting: Physical Therapy

## 2019-04-13 DIAGNOSIS — G8929 Other chronic pain: Secondary | ICD-10-CM

## 2019-04-13 DIAGNOSIS — M545 Low back pain, unspecified: Secondary | ICD-10-CM

## 2019-04-13 NOTE — Patient Instructions (Signed)
Access Code: [email or web address]  URL: [email or web address]  Date: 04/13/2019  Prepared by: Lyndee Hensen   Exercises Single Knee to Chest Stretch - 3 reps - 30 hold - 2x daily Supine Piriformis Stretch - 3 reps - 30 hold - 2x daily Childs Pose - 3 reps - 30 hold - 2x daily Seated Figure 4 Piriformis Stretch - 3 reps - 30 hold - 2x daily Seated Hamstring Stretch - 3 reps - 30 hold - 2x daily Supine March - 10 reps - 2 sets - 1x daily Hooklying Isometric Clamshell - 10 reps - 2 sets - 1x daily Supine Bridge - 10 reps - 2 sets - 1x daily Sit to Stand - 10 reps - 1 sets - 2x daily

## 2019-04-13 NOTE — Therapy (Signed)
[facility] [address] [place], [place], [address] Phone: [phone]   Fax:  [phone]  Physical Therapy Treatment  Patient Details  Name: Cheryl KurtzJoanne Austin MRN: [id number] Date of Birth: 1941/01/17 Referring Provider (PT): Asencion Partridgeamille Andy   Encounter Date: 04/13/2019  PT End of Session - 04/13/19 1315    Visit Number  2    Number of Visits  6    Date for PT Re-Evaluation  05/12/19    Authorization Type  UHC    PT Start Time  1254    PT Stop Time  1342    PT Time Calculation (min)  48 min    Activity Tolerance  Patient tolerated treatment well    Behavior During Therapy  [facility] for tasks assessed/performed       Past Medical History:  Diagnosis Date  . Anxiety   . 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 Family history of Parkinson disease 05/21/2016   Mother  . GERD (gastroesophageal reflux disease)   . Kidney failure    blood transfusion  . Moderate episode of recurrent major depressive disorder (HCC)   . Osteoporosis   . Restless legs syndrome (RLS) 08/02/2017  . Thyroid disease    Hypothyroid    Past Surgical History:  Procedure Laterality Date  . APPENDECTOMY  1961  . BREAST SURGERY Bilateral 1987 1998   Lumpectomy  . EYE SURGERY    . TOTAL ABDOMINAL HYSTERECTOMY  1986   BSO/Fibroids    There were no vitals filed for this visit.  Subjective Assessment - 04/13/19 1313    Subjective  Pt states she was having cold symptoms last week, from sunday-wednesday, she was tested for COVID, negative. She states no symptoms at this time, feeling fine. Has been able to do HEP.    Currently in Pain?  Yes    Pain Score  8     Pain Location  Back    Pain Orientation  Left;Right    Pain Descriptors / Indicators  Aching    Pain Type  Chronic pain    Pain Onset  More than a month ago    Pain Frequency  Intermittent    Aggravating Factors   bending, lifting, gardening    Effect of  Pain on Daily Activities  May be up to 8/10 with increased bending, lifting.                       [facility] Adult PT Treatment/Exercise - 04/13/19 0001      Ambulation/Gait   Gait Comments  --      Posture/Postural Control   Posture Comments  --      Exercises   Exercises  Lumbar      Lumbar Exercises: Stretches   Active Hamstring Stretch  3 reps;30 seconds    Active Hamstring Stretch Limitations  seated    Passive Hamstring Stretch  Right;Left    Single Knee to Chest Stretch  --    Other Lumbar Stretch Exercise  childs pose 30 sec x3;       Lumbar Exercises: Aerobic   Stationary Bike  L1 x 8 min;       Lumbar Exercises: Standing   Functional Squats  20 reps    Functional Squats Limitations  x10 for mechanics, x10 with 5lb weight/lift from box     Row  20 reps    Theraband Level (Row)  Level 3 (  Green)      Lumbar Exercises: Seated   Sit to Stand  10 reps      Lumbar Exercises: Supine   Clam  20 reps    Clam Limitations  GTB    Bent Knee Raise  20 reps    Bridge  20 reps      Lumbar Exercises: Sidelying   Hip Abduction  20 reps;Both                  PT Long Term Goals - 03/31/19 1538      PT LONG TERM GOAL #1   Title  Pt to be independent with final HEP    Time  6    Period  Weeks    Status  New    Target Date  05/12/19      PT LONG TERM GOAL #2   Title  Pt to demo improved posture with squat, lift position, to improve ability and safety with IADLS.    Time  6    Period  Weeks    Target Date  05/12/19      PT LONG TERM GOAL #3   Title  Pt to report decreased pain in low back to <4/10 with standing and housework.    Time  6    Period  Weeks    Target Date  05/12/19            Plan - 04/13/19 1344    Clinical Impression Statement  HEp reviewed. Strengthening progressed today, added for HEP. Pt requires mod-max cuing for correct mechanics. Education also done for hip hinge motion and correct squat mechanics for transfers and  ADLs. Pt to benefit from continued education and practice with this. Plan to progress strength as tolerated. No pain during activities today.    Personal Factors and Comorbidities  Comorbidity 1;Time since onset of injury/illness/exacerbation    Comorbidities  Osteoporosis,    Examination-Activity Limitations  Locomotion Level;Bend;Carry;Squat;Stairs;Stand;Lift    Examination-Participation Restrictions  Cleaning;Community Activity;Laundry;Yard Work    Stability/Clinical Decision Making  Stable/Uncomplicated    Rehab Potential  Good    PT Frequency  1x / week    PT Duration  6 weeks    PT Treatment/Interventions  ADLs/Self Care Home Management;Cryotherapy;Electrical Stimulation;Ultrasound;Traction;Moist Heat;Iontophoresis 4mg /ml Dexamethasone;Gait training;Stair training;Functional mobility training;Therapeutic activities;Therapeutic exercise;Balance training;Neuromuscular re-education;Manual techniques;Patient/family education;Passive range of motion;Dry needling;Joint Manipulations;Spinal Manipulations;Taping    Consulted and Agree with Plan of Care  Patient       Patient will benefit from skilled therapeutic intervention in order to improve the following deficits and impairments:  Abnormal gait, Decreased range of motion, Increased muscle spasms, Decreased endurance, Decreased activity tolerance, Pain, Improper body mechanics, Impaired flexibility, Decreased mobility, Decreased strength  Visit Diagnosis: Chronic bilateral low back pain without sciatica     Problem List Patient Active Problem List   Diagnosis Date Noted  . 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 Spondylosis of cervical region without myelopathy or radiculopathy 05/29/2018  . Family history of Parkinson disease 05/21/2016  . Seborrheic dermatitis 11/02/2014  . Osteopenia 07/13/2013  . Moderate episode of recurrent major depressive disorder (HCC) 06/22/2013  . Dermatitis, atopic 01/30/2013  . Menopausal hot  flushes 01/30/2013  . MRSA cellulitis 01/30/2013  . 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 Insomnia 03/18/2012  . Hypothyroidism 12/18/2010    Sedalia MutaLauren Skip Litke, PT, DPT 1:47 PM  04/13/19    [facility] [address] [place], [place], [address] Phone: [phone]  Fax:  [phone]  Name: Cheryl Austin MRN: [id number] Date of Birth: 1941/06/13

## 2019-04-20 ENCOUNTER — Ambulatory Visit (INDEPENDENT_AMBULATORY_CARE_PROVIDER_SITE_OTHER): Payer: Medicare Other | Admitting: Physical Therapy

## 2019-04-20 ENCOUNTER — Encounter: Payer: Self-pay | Admitting: Physical Therapy

## 2019-04-20 ENCOUNTER — Other Ambulatory Visit: Payer: Self-pay

## 2019-04-20 DIAGNOSIS — G8929 Other chronic pain: Secondary | ICD-10-CM | POA: Diagnosis not present

## 2019-04-20 DIAGNOSIS — M545 Low back pain: Secondary | ICD-10-CM | POA: Diagnosis not present

## 2019-04-20 NOTE — Therapy (Signed)
[facility] [address] [place], [place], [address] Phone: [phone]   Fax:  [phone]  Physical Therapy Treatment  Patient Details  Name: Monserrat Pavek MRN: [id number] Date of Birth: 05-05-1941 Referring Provider (PT): Asencion Partridge   Encounter Date: 04/20/2019  PT End of Session - 04/20/19 1305    Visit Number  3    Number of Visits  6    Date for PT Re-Evaluation  05/12/19    Authorization Type  UHC    PT Start Time  1214    PT Stop Time  1253    PT Time Calculation (min)  39 min    Activity Tolerance  Patient tolerated treatment well    Behavior During Therapy  [facility] for tasks assessed/performed       Past Medical History:  Diagnosis Date  . Anxiety   . 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 Family history of Parkinson disease 05/21/2016   Mother  . GERD (gastroesophageal reflux disease)   . Kidney failure    blood transfusion  . Moderate episode of recurrent major depressive disorder (HCC)   . Osteoporosis   . Restless legs syndrome (RLS) 08/02/2017  . Thyroid disease    Hypothyroid    Past Surgical History:  Procedure Laterality Date  . APPENDECTOMY  1961  . BREAST SURGERY Bilateral 1987 1998   Lumpectomy  . EYE SURGERY    . TOTAL ABDOMINAL HYSTERECTOMY  1986   BSO/Fibroids    There were no vitals filed for this visit.  Subjective Assessment - 04/20/19 1223    Subjective  Pt states no pain today, but has increased pain at times when home, can get up to 8/10, mostly with bending, moving things.    Currently in Pain?  Yes    Pain Score  8     Pain Location  Back    Pain Orientation  Right;Left    Pain Descriptors / Indicators  Aching    Pain Type  Chronic pain    Pain Onset  More than a month ago    Pain Frequency  Intermittent                       [facility] Adult PT Treatment/Exercise - 04/20/19 1218      Exercises   Exercises  Lumbar       Lumbar Exercises: Stretches   Active Hamstring Stretch  3 reps;30 seconds    Active Hamstring Stretch Limitations  seated    Passive Hamstring Stretch  Right;Left    Standing Side Bend  2 reps;30 seconds    Standing Side Bend Limitations  at wall    Other Lumbar Stretch Exercise  childs pose 30 sec x3; L/R/Center      Lumbar Exercises: Aerobic   Stationary Bike  L2 x 3 min, L1  x 7 min;       Lumbar Exercises: Standing   Functional Squats  20 reps    Functional Squats Limitations  x10 for mechanics, x10 with 5lb weight/lift from box     Row  20 reps    Theraband Level (Row)  Level 3 (Green)    Other Standing Lumbar Exercises  standing hip abd 2x10 bil;     Other Standing Lumbar Exercises  TA , holding weight 10lb walk 35 x4;       Lumbar Exercises: Seated   Sit to Stand  --  Lumbar Exercises: Supine   Clam  20 reps    Clam Limitations  GTB    Bent Knee Raise  20 reps    Bent Knee Raise Limitations  RTB at thighs    Bridge  --    Bridge with clamshell  20 reps      Lumbar Exercises: Sidelying   Hip Abduction  20 reps;Both                  PT Long Term Goals - 03/31/19 1538      PT LONG TERM GOAL #1   Title  Pt to be independent with final HEP    Time  6    Period  Weeks    Status  New    Target Date  05/12/19      PT LONG TERM GOAL #2   Title  Pt to demo improved posture with squat, lift position, to improve ability and safety with IADLS.    Time  6    Period  Weeks    Target Date  05/12/19      PT LONG TERM GOAL #3   Title  Pt to report decreased pain in low back to <4/10 with standing and housework.    Time  6    Period  Weeks    Target Date  05/12/19            Plan - 04/20/19 1306    Clinical Impression Statement  Pt with improving ability for ther ex, does require cuing for TA activation with most things today. Discussed importance of this when returning to gym and with lifting at home. Pt with improved mechanics for bending,  lifting today, and no pain when doing so. She will benefit from 1-2 more sessions for progression of strengthening and continued education on posture with functional activity. Pt hoping to find gym this week, wants to continue exerise after PT is done. .    Personal Factors and Comorbidities  Comorbidity 1;Time since onset of injury/illness/exacerbation    Comorbidities  Osteoporosis,    Examination-Activity Limitations  Locomotion Level;Bend;Carry;Squat;Stairs;Stand;Lift    Examination-Participation Restrictions  Cleaning;Community Activity;Laundry;Yard Work    Stability/Clinical Decision Making  Stable/Uncomplicated    Rehab Potential  Good    PT Frequency  1x / week    PT Duration  6 weeks    PT Treatment/Interventions  ADLs/Self Care Home Management;Cryotherapy;Electrical Stimulation;Ultrasound;Traction;Moist Heat;Iontophoresis 4mg /ml Dexamethasone;Gait training;Stair training;Functional mobility training;Therapeutic activities;Therapeutic exercise;Balance training;Neuromuscular re-education;Manual techniques;Patient/family education;Passive range of motion;Dry needling;Joint Manipulations;Spinal Manipulations;Taping    Consulted and Agree with Plan of Care  Patient       Patient will benefit from skilled therapeutic intervention in order to improve the following deficits and impairments:  Abnormal gait, Decreased range of motion, Increased muscle spasms, Decreased endurance, Decreased activity tolerance, Pain, Improper body mechanics, Impaired flexibility, Decreased mobility, Decreased strength  Visit Diagnosis: Chronic bilateral low back pain without sciatica     Problem List Patient Active Problem List   Diagnosis Date Noted  . 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 Spondylosis of cervical region without myelopathy or radiculopathy 05/29/2018  . Family history of Parkinson disease 05/21/2016  . Seborrheic dermatitis 11/02/2014  . Osteopenia 07/13/2013  . Moderate  episode of recurrent major depressive disorder ([place]) 06/22/2013  . Dermatitis, atopic 01/30/2013  . Menopausal hot flushes 01/30/2013  . MRSA cellulitis 01/30/2013  . 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 Insomnia 03/18/2012  . Hypothyroidism 12/18/2010    Lyndee Hensen, PT,  DPT 1:08 PM  04/20/19    [facility] [address] [address] [place], [place], [address] Phone: [phone]   Fax:  [phone]  Name: Faylene KurtzJoanne Bracamonte MRN: [id number] Date of Birth: [birth year]

## 2019-04-29 ENCOUNTER — Other Ambulatory Visit: Payer: Self-pay

## 2019-04-29 ENCOUNTER — Ambulatory Visit (INDEPENDENT_AMBULATORY_CARE_PROVIDER_SITE_OTHER): Payer: Medicare Other | Admitting: Physical Therapy

## 2019-04-29 ENCOUNTER — Encounter: Payer: Self-pay | Admitting: Physical Therapy

## 2019-04-29 DIAGNOSIS — M545 Low back pain: Secondary | ICD-10-CM | POA: Diagnosis not present

## 2019-04-29 DIAGNOSIS — G8929 Other chronic pain: Secondary | ICD-10-CM

## 2019-04-29 NOTE — Therapy (Signed)
[facility] [address] [place], [place], [address] Phone: [phone]   Fax:  [phone]  Physical Therapy Treatment/Discharge   Patient Details  Name: Cheryl Austin MRN: [id number] Date of Birth: August 25, 1940 Referring Provider (PT): Billey Chang   Encounter Date: 04/29/2019  PT End of Session - 04/29/19 1312    Visit Number  4    Number of Visits  6    Date for PT Re-Evaluation  05/12/19    Authorization Type  UHC    PT Start Time  1303    PT Stop Time  1345    PT Time Calculation (min)  42 min    Activity Tolerance  Patient tolerated treatment well    Behavior During Therapy  [facility] for tasks assessed/performed       Past Medical History:  Diagnosis Date  . Anxiety   . 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 Family history of Parkinson disease 05/21/2016   Mother  . GERD (gastroesophageal reflux disease)   . Kidney failure    blood transfusion  . Moderate episode of recurrent major depressive disorder ([place])   . Osteoporosis   . Restless legs syndrome (RLS) 08/02/2017  . Thyroid disease    Hypothyroid    Past Surgical History:  Procedure Laterality Date  . APPENDECTOMY  1961  . BREAST SURGERY Bilateral 1987 1998   Lumpectomy  . EYE SURGERY    . TOTAL ABDOMINAL HYSTERECTOMY  1986   BSO/Fibroids    There were no vitals filed for this visit.  Subjective Assessment - 04/29/19 1310    Subjective  Pt states minimal pain, unless she "does something" bending, picking things up, increases pain.    Currently in Pain?  Yes    Pain Score  5     Pain Location  Back    Pain Orientation  Right;Left    Pain Descriptors / Indicators  Aching    Pain Type  Chronic pain    Pain Onset  More than a month ago    Pain Frequency  Intermittent    Aggravating Factors   bending, lifting,    Effect of Pain on Daily Activities  May increase up to 5/10 with bending, then goes away.   Stable  2/10 daily.                       [facility] Adult PT Treatment/Exercise - 04/29/19 1313      Exercises   Exercises  Lumbar      Lumbar Exercises: Stretches   Active Hamstring Stretch  3 reps;30 seconds    Active Hamstring Stretch Limitations  seated    Passive Hamstring Stretch  Right;Left    Standing Side Bend  --    Standing Side Bend Limitations  --    Other Lumbar Stretch Exercise  childs pose 30 sec x3; L/R/Center      Lumbar Exercises: Aerobic   Stationary Bike  L2 x 8 min      Lumbar Exercises: Standing   Functional Squats  20 reps    Functional Squats Limitations  x10 for mechanics, x10 with 8lb weight/lift     Row  20 reps    Theraband Level (Row)  Level 3 (Green)    Other Standing Lumbar Exercises  standing hip abd 2x10 bil, YTB;     Other Standing Lumbar Exercises  --      Lumbar Exercises: Supine  Clam  20 reps    Clam Limitations  GTB    Bent Knee Raise  --    Bent Knee Raise Limitations  --    Bridge with clamshell  20 reps    Straight Leg Raise  20 reps    Straight Leg Raises Limitations  bil    Other Supine Lumbar Exercises  Modified crunch x20;       Lumbar Exercises: Sidelying   Hip Abduction  --             PT Education - 04/29/19 1311    Education Details  Reviewed HEP.    Person(s) Educated  Patient    Methods  Explanation;Demonstration;Tactile cues;Verbal cues;Handout    Comprehension  Verbalized understanding;Returned demonstration;Verbal cues required          PT Long Term Goals - 04/29/19 1402      PT LONG TERM GOAL #1   Title  Pt to be independent with final HEP    Time  6    Period  Weeks    Status  Achieved      PT LONG TERM GOAL #2   Title  Pt to demo improved posture with squat, lift position, to improve ability and safety with IADLS.    Time  6    Period  Weeks    Status  Achieved      PT LONG TERM GOAL #3   Title  Pt to report decreased pain in low back to <4/10 with standing and housework.    Time   6    Period  Weeks    Status  Partially Met            Plan - 04/29/19 1403    Clinical Impression Statement  Pt doing very well. Pt with reports of decreased tightness after session. She has no pain during sessions, or with most activity, but states "tightness" and increased pain at times only with bending/lifting. Lift/squat mechanics reviewed again today, pt with good understanding. Final HEP reviewed today. Pt has met goals at this time and is ready for d/c to HEP. Pt hoping to transition to gym. Pt in agreement with plan.    Personal Factors and Comorbidities  Comorbidity 1;Time since onset of injury/illness/exacerbation    Comorbidities  Osteoporosis,    Examination-Activity Limitations  Locomotion Level;Bend;Carry;Squat;Stairs;Stand;Lift    Examination-Participation Restrictions  Cleaning;Community Activity;Laundry;Yard Work    Stability/Clinical Decision Making  Stable/Uncomplicated    Rehab Potential  Good    PT Frequency  1x / week    PT Duration  6 weeks    PT Treatment/Interventions  ADLs/Self Care Home Management;Cryotherapy;Electrical Stimulation;Ultrasound;Traction;Moist Heat;Iontophoresis 44m/ml Dexamethasone;Gait training;Stair training;Functional mobility training;Therapeutic activities;Therapeutic exercise;Balance training;Neuromuscular re-education;Manual techniques;Patient/family education;Passive range of motion;Dry needling;Joint Manipulations;Spinal Manipulations;Taping    Consulted and Agree with Plan of Care  Patient       Patient will benefit from skilled therapeutic intervention in order to improve the following deficits and impairments:  Abnormal gait, Decreased range of motion, Increased muscle spasms, Decreased endurance, Decreased activity tolerance, Pain, Improper body mechanics, Impaired flexibility, Decreased mobility, Decreased strength  Visit Diagnosis: Chronic bilateral low back pain without sciatica     Problem List Patient Active Problem  List   Diagnosis Date Noted  . 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 Spondylosis of cervical region without myelopathy or radiculopathy 05/29/2018  . Family history of Parkinson disease 05/21/2016  . Seborrheic dermatitis 11/02/2014  . Osteopenia 07/13/2013  . Moderate episode of  recurrent major depressive disorder ([place]) 06/22/2013  . Dermatitis, atopic 01/30/2013  . Menopausal hot flushes 01/30/2013  . MRSA cellulitis 01/30/2013  . 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 Insomnia 03/18/2012  . Hypothyroidism 12/18/2010    Lyndee Hensen, PT, DPT 2:12 PM  04/29/19    [place], [place], [address] Phone: [phone]   Fax:  [phone]  Name: Cheryl Austin MRN: [id number] Date of Birth: 12-15-1940    PHYSICAL THERAPY DISCHARGE SUMMARY  Visits from Start of Care: 4  Plan: Patient agrees to discharge.  Patient goals were met. Patient is being discharged due to meeting the stated rehab goals.  ?????     Lyndee Hensen, PT, DPT 2:12 PM  04/29/19

## 2019-04-29 NOTE — Patient Instructions (Signed)
Access Code: [email or web address]  URL: [email or web address]  Date: 04/29/2019  Prepared by: Lyndee Hensen   Exercises Single Knee to Chest Stretch - 3 reps - 30 hold - 2x daily Supine Piriformis Stretch - 3 reps - 30 hold - 2x daily Childs Pose - 3 reps - 30 hold - 2x daily Seated Figure 4 Piriformis Stretch - 3 reps - 30 hold - 2x daily Seated Hamstring Stretch - 3 reps - 30 hold - 2x daily Hooklying Isometric Clamshell - 10 reps - 2 sets - 1x daily Supine Bridge - 10 reps - 2 sets - 1x daily Sidelying Hip Abduction - 10 reps - 2 sets - 1x daily Supine March - 10 reps - 2 sets - 1x daily Straight Leg Raise - 10 reps - 2 sets - 1x daily Standing Repeated Hip Abduction with Resistance - 10 reps - 2 sets - 1x daily Sit to Stand - 10 reps - 1 sets - 2x daily

## 2019-05-06 ENCOUNTER — Encounter: Payer: Self-pay | Admitting: Family Medicine

## 2019-05-06 ENCOUNTER — Ambulatory Visit (INDEPENDENT_AMBULATORY_CARE_PROVIDER_SITE_OTHER): Payer: Medicare Other | Admitting: Family Medicine

## 2019-05-06 DIAGNOSIS — J208 Acute bronchitis due to other specified organisms: Secondary | ICD-10-CM | POA: Diagnosis not present

## 2019-05-06 MED ORDER — GUAIFENESIN-CODEINE 100-10 MG/5ML PO SOLN: 5.0000 mL | Freq: Four times a day (QID) | ORAL | 0 refills | Status: DC | PRN

## 2019-05-06 NOTE — Progress Notes (Signed)
Virtual Visit via Video Note  Subjective  CC:  Chief Complaint  Patient presents with  . Possible Bronchitis    Recurrent uri sxs ongoing x 4-5 days. Reports horseness, dry cough, and minor sore throat.Marland Kitchen. Has been using Tessalon Pearls with minimal relief.     I connected with Cheryl Austin on 05/06/19 at  4:00 PM EDT by a video enabled telemedicine application and verified that I am speaking with the correct person using two identifiers. Location patient: Home Location provider: [facility] [address] Persons participating in the virtual visit: Cheryl KurtzJoanne Austin, Willow Oraamille L Andy, MD Rita Oharaiara Simmons, CMA  I discussed the limitations of evaluation and management by telemedicine and the availability of in person appointments. The patient expressed understanding and agreed to proceed. HPI: Cheryl Austin is a 78 y.o. female who was contacted today to address the problems listed above in the chief complaint. . Had viral URI in mid to late august that resolved in 3-4 days with negative covid testing. Did well until about 4-5 days ago, when similar sxs started again. Dry cough, mild body aches, no fever, ST. No sob, wheezing, chest pain, n/v/d and cough does interfere with sleep. No known exposures to covid. Nl appetite.   Assessment  1. Acute viral bronchitis      Plan   Viral bronchitis:  Most c/w virus: will support with delsym and robAC and rest and fluids. If persists, then consider abx. She will get back to us if it worsens or persists.  I discussed the assessment and treatment plan with the patient. The patient was provided an opportunity to ask questions and all were answered. The patient agreed with the plan and demonstrated an understanding of the instructions.   The patient was advised to call back or seek an in-person evaluation if the symptoms worsen or if the condition fails to improve as anticipated. Follow up: prn  Visit date not found  Meds  ordered this encounter  Medications  . guaiFENesin-codeine 100-10 MG/5ML syrup    Sig: Take 5 mLs by mouth every 6 (six) hours as needed for cough.    Dispense:  120 mL    Refill:  0      I reviewed the patients updated PMH, FH, and SocHx.    Patient Active Problem List   Diagnosis Date Noted  . 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 Spondylosis of cervical region without myelopathy or radiculopathy 05/29/2018  . Family history of Parkinson disease 05/21/2016  . Seborrheic dermatitis 11/02/2014  . Osteopenia 07/13/2013  . Moderate episode of recurrent major depressive disorder (HCC) 06/22/2013  . Dermatitis, atopic 01/30/2013  . Menopausal hot flushes 01/30/2013  . MRSA cellulitis 01/30/2013  . 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 Insomnia 03/18/2012  . Hypothyroidism 12/18/2010   Current Meds  Medication Sig  . levothyroxine (SYNTHROID, LEVOTHROID) 50 MCG tablet Take 1 tablet (50 mcg total) by mouth daily.  Marland Kitchen. LORazepam (ATIVAN) 0.5 MG tablet Take 1-2 tablets by mouth daily as needed.  . sertraline (ZOLOFT) 100 MG tablet Take 1 tablet by mouth daily.  . traZODone (DESYREL) 100 MG tablet Take 100 mg by mouth as needed for sleep.    Allergies: Patient has No Known Allergies. Family History: Patient family history includes Alcohol abuse in her brother and daughter; Asthma in her mother; Depression in her daughter and mother; Heart disease in her father; Hyperlipidemia in her father; Lupus in her mother; Parkinson's disease in her  mother; Stroke in her father. Social History:  Patient  reports that she has never smoked. She has never used smokeless tobacco. She reports current alcohol use. She reports that she does not use drugs.  Review of Systems: Constitutional: Negative for fever malaise or anorexia Cardiovascular: negative for chest pain Respiratory: negative for SOB or persistent cough Gastrointestinal: negative for abdominal pain  OBJECTIVE Vitals: There  were no vitals taken for this visit. General: no acute distress , A&Ox3, mild dry cough present. No respiratory distress. Appears well  Leamon Arnt, MD

## 2019-05-07 ENCOUNTER — Ambulatory Visit: Payer: Medicare Other | Admitting: Family Medicine

## 2019-05-14 ENCOUNTER — Encounter: Payer: Self-pay | Admitting: Family Medicine

## 2019-05-14 ENCOUNTER — Other Ambulatory Visit: Payer: Self-pay

## 2019-05-14 ENCOUNTER — Ambulatory Visit: Payer: Medicare Other | Admitting: Family Medicine

## 2019-05-14 VITALS — BP 110/72 | HR 84 | Temp 97.3°F | Resp 14 | Ht 63.0 in | Wt 114.6 lb

## 2019-05-14 DIAGNOSIS — M79605 Pain in left leg: Secondary | ICD-10-CM

## 2019-05-14 DIAGNOSIS — M79604 Pain in right leg: Secondary | ICD-10-CM

## 2019-05-14 DIAGNOSIS — E039 Hypothyroidism, unspecified: Secondary | ICD-10-CM | POA: Diagnosis not present

## 2019-05-14 DIAGNOSIS — J208 Acute bronchitis due to other specified organisms: Secondary | ICD-10-CM | POA: Diagnosis not present

## 2019-05-14 DIAGNOSIS — B9689 Other specified bacterial agents as the cause of diseases classified elsewhere: Secondary | ICD-10-CM

## 2019-05-14 LAB — COMPREHENSIVE METABOLIC PANEL
ALT: 22 U/L (ref 0–35)
AST: 27 U/L (ref 0–37)
Albumin: 4.3 g/dL (ref 3.5–5.2)
Alkaline Phosphatase: 91 U/L (ref 39–117)
BUN: 13 mg/dL (ref 6–23)
CO2: 27 mEq/L (ref 19–32)
Calcium: 9.2 mg/dL (ref 8.4–10.5)
Chloride: 101 mEq/L (ref 96–112)
Creatinine, Ser: 0.65 mg/dL (ref 0.40–1.20)
GFR: 88.45 mL/min (ref >60.00)
Glucose, Bld: 84 mg/dL (ref 70–99)
Potassium: 4.2 mEq/L (ref 3.5–5.1)
Sodium: 136 mEq/L (ref 135–145)
Total Bilirubin: 0.5 mg/dL (ref 0.2–1.2)
Total Protein: 6.2 g/dL (ref 6.0–8.3)

## 2019-05-14 LAB — CBC WITH DIFFERENTIAL/PLATELET
Basophils Absolute: 0 10*3/uL (ref 0.0–0.1)
Basophils Relative: 0.6 % (ref 0.0–3.0)
Eosinophils Absolute: 0.1 10*3/uL (ref 0.0–0.7)
Eosinophils Relative: 1.9 % (ref 0.0–5.0)
HCT: 39.4 % (ref 36.0–46.0)
Hemoglobin: 13.3 g/dL (ref 12.0–15.0)
Lymphocytes Relative: 22.2 % (ref 12.0–46.0)
Lymphs Abs: 1.3 10*3/uL (ref 0.7–4.0)
MCHC: 33.7 g/dL (ref 30.0–36.0)
MCV: 88.7 fl (ref 78.0–100.0)
Monocytes Absolute: 0.4 10*3/uL (ref 0.1–1.0)
Monocytes Relative: 7.5 % (ref 3.0–12.0)
Neutro Abs: 3.9 10*3/uL (ref 1.4–7.7)
Neutrophils Relative %: 67.8 % (ref 43.0–77.0)
Platelets: 272 10*3/uL (ref 150.0–400.0)
RBC: 4.44 Mil/uL (ref 3.87–5.11)
RDW: 13.5 % (ref 11.5–15.5)
WBC: 5.8 10*3/uL (ref 4.0–10.5)

## 2019-05-14 LAB — SEDIMENTATION RATE: Sed Rate: 10 mm/hr (ref 0–30)

## 2019-05-14 LAB — VITAMIN D 25 HYDROXY (VIT D DEFICIENCY, FRACTURES): VITD: 28.47 ng/mL — ABNORMAL LOW (ref 30.00–100.00)

## 2019-05-14 LAB — TSH: TSH: 0.73 u[IU]/mL (ref 0.35–4.50)

## 2019-05-14 MED ORDER — AZITHROMYCIN 250 MG PO TABS: ORAL_TABLET | ORAL | 0 refills | Status: DC

## 2019-05-14 MED ORDER — PREDNISONE 10 MG PO TABS: ORAL_TABLET | ORAL | 0 refills | Status: DC

## 2019-05-14 NOTE — Patient Instructions (Signed)
Please return in 2 weeks if your symptoms persist for recheck.   I will release your lab results to you on your MyChart account with further instructions. Please reply with any questions.    If you have any questions or concerns, please don't hesitate to send me a message via MyChart or call the office at [phone]. Thank you for visiting with [place] today! It's our pleasure caring for you.

## 2019-05-14 NOTE — Progress Notes (Signed)
Subjective  CC:  Chief Complaint  Patient presents with   Cough    Lingering    Leg Pain    Bilateral, going on about 4 weeks.. Starts at groin and goes down calfs.. Has tried aspirin with minimal relief    HPI: SUBJECTIVE:  Cheryl Austin is a 78 y.o. female who complains of persistent cough.  See last note.  Diagnosed with viral bronchitis but symptoms persist and now with productive cough.  No shortness of breath or chest pain.  Also with myalgias.  No fevers, sinus pain, calf swelling.  She had negative COVID testing  Complains of bilateral lower extremity pain described as groin pain and pain in bilateral legs.  She is a vague historian but pain awakens her at night and is worse in the morning.  She describes stiffness.  She denies joint pain, joint swelling.  She denies back pain but has been recently seen for chronic midline low back pain that she was seeing a chiropractor for.  She denies weakness or radicular pain.  No bowel or bladder dysfunction.  No injury or trauma.  She says she will feel sore at times.  Worse after lying for prolonged time.  Does better throughout the day.  Hypothyroidism due for recheck.  Energy level is fair  Assessment  1. Acute bacterial bronchitis   2. Bilateral leg pain   3. Acquired hypothyroidism      Plan  Discussion:  Treat for bacterial bronchitis due to prolonged course and worsening symptoms. Education regarding differences between viral and bacterial infections and treatment options are discussed.  Supportive care measures are recommended.  We discussed the use of mucolytic's, decongestants, antihistamines and antitussives as needed.  Tylenol or Advil are recommended if needed.  Leg pain: Unclear diagnosis.  She believes this is related to her infection however it is atypical.  Consider spinal stenosis given history of back pain.  For now we will treat with prednisone and follow-up if not improving.  May warrant imaging.  No red flags  noted  Recheck thyroid levels. Follow up: No follow-ups on file.   Orders Placed This Encounter  Procedures   DG Chest 2 View   CBC with Differential/Platelet   Sedimentation rate   Comprehensive metabolic panel   VITAMIN D 25 Hydroxy (Vit-D Deficiency, Fractures)   TSH   Meds ordered this encounter  Medications   azithromycin (ZITHROMAX) 250 MG tablet    Sig: Take 2 tabs today, then 1 tab daily for 4 days    Dispense:  1 each    Refill:  0   predniSONE (DELTASONE) 10 MG tablet    Sig: Take 4 tabs qd x 2 days, 3 qd x 2 days, 2 qd x 2d, 1qd x 3 days    Dispense:  21 tablet    Refill:  0      I reviewed the patients updated PMH, FH, and SocHx.  Social History: Patient  reports that she has never smoked. She has never used smokeless tobacco. She reports current alcohol use. She reports that she does not use drugs.  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   Spondylosis of cervical region without myelopathy or radiculopathy 05/29/2018   Family history of Parkinson disease 05/21/2016   Seborrheic dermatitis 11/02/2014   Osteopenia 07/13/2013   Moderate episode of recurrent major depressive disorder ([place]) 06/22/2013   Dermatitis, atopic 01/30/2013   Menopausal hot flushes 01/30/2013  MRSA cellulitis 01/30/2013   Osteoarthritis, 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   Insomnia 03/18/2012   Hypothyroidism 12/18/2010    Review of Systems: Cardiovascular: negative for chest pain Respiratory: negative for SOB or hemoptysis Gastrointestinal: negative for abdominal pain Genitourinary: negative for dysuria or gross hematuria Current Meds  Medication Sig   levothyroxine (SYNTHROID, LEVOTHROID) 50 MCG tablet Take 1 tablet (50 mcg total) by mouth daily.   LORazepam (ATIVAN) 0.5 MG tablet Take 1-2 tablets by mouth daily as needed.   sertraline (ZOLOFT) 100 MG tablet Take 1 tablet by mouth daily.   traZODone (DESYREL)  100 MG tablet Take 100 mg by mouth as needed for sleep.    Objective  Vitals: BP 110/72    Pulse 84    Temp (!) 97.3 F (36.3 C) (Tympanic)    Resp 14    Ht 5\' 3"  (1.6 m)    Wt 114 lb 9.6 oz (52 kg)    SpO2 98%    BMI 20.30 kg/m  General: no acute distress, thin, appears well Psych:  Alert and oriented, normal mood and affect HEENT:  Normocephalic, atraumatic, supple neck, moist mucous membranes, mildly erythematous pharynx without exudate, mild lymphadenopathy, supple neck Cardiovascular:  RRR without murmur. no edema Respiratory:  Good breath sounds bilaterally, CTAB with normal respiratory effort Skin:  Warm, no rashes Neurologic:   Mental status is normal. normal gait Musculoskeletal: Nontender muscles throughout, no rash, no calf tenderness or cords noted, normal range of motion bilateral hips without groin tenderness.  Negative straight leg raise bilaterally normal strength throughout.  Commons side effects, risks, benefits, and alternatives for medications and treatment plan prescribed today were discussed, and the patient expressed understanding of the given instructions. Patient is instructed to call or message via MyChart if he/she has any questions or concerns regarding our treatment plan. No barriers to understanding were identified. We discussed Red Flag symptoms and signs in detail. Patient expressed understanding regarding what to do in case of urgent or emergency type symptoms.  Medication list was reconciled, printed and provided to the patient in AVS. Patient instructions and summary information was reviewed with the patient as documented in the AVS. This note was prepared with assistance of Dragon voice recognition software. Occasional wrong-word or sound-a-like substitutions may have occurred due to the inherent limitations of voice recognition software

## 2019-05-25 ENCOUNTER — Other Ambulatory Visit: Payer: Medicare Other

## 2019-05-25 ENCOUNTER — Telehealth (INDEPENDENT_AMBULATORY_CARE_PROVIDER_SITE_OTHER): Payer: Medicare Other | Admitting: Family Medicine

## 2019-05-25 ENCOUNTER — Other Ambulatory Visit: Payer: Self-pay

## 2019-05-25 ENCOUNTER — Ambulatory Visit (INDEPENDENT_AMBULATORY_CARE_PROVIDER_SITE_OTHER): Payer: Medicare Other

## 2019-05-25 ENCOUNTER — Encounter: Payer: Self-pay | Admitting: Family Medicine

## 2019-05-25 DIAGNOSIS — R053 Chronic cough: Secondary | ICD-10-CM

## 2019-05-25 DIAGNOSIS — G8929 Other chronic pain: Secondary | ICD-10-CM | POA: Diagnosis not present

## 2019-05-25 DIAGNOSIS — R05 Cough: Secondary | ICD-10-CM | POA: Diagnosis not present

## 2019-05-25 DIAGNOSIS — M545 Low back pain: Secondary | ICD-10-CM | POA: Diagnosis not present

## 2019-05-25 MED ORDER — OMEPRAZOLE 20 MG PO CPDR: 20.0000 mg | DELAYED_RELEASE_CAPSULE | Freq: Every day | ORAL | 3 refills | Status: DC

## 2019-05-25 NOTE — Progress Notes (Signed)
Virtual Visit via Video Note  Subjective  CC:  Chief Complaint  Patient presents with  . Cough    Reports cough is dry, RX for cough syrup has helped body aches not cough     I connected with Cheryl Austin on 05/25/19 at  3:20 PM EDT by a video enabled telemedicine application and verified that I am speaking with the correct person using two identifiers. Location patient: Home Location provider: [facility] [place], Office Persons participating in the virtual visit: Dezaray Shibuya, Leamon Arnt, MD Lilli Light, [place] discussed the limitations of evaluation and management by telemedicine and the availability of in person appointments. The patient expressed understanding and agreed to proceed. HPI: Cheryl Austin is a 78 y.o. female who was contacted today to address the problems listed above in the chief complaint. Cheryl Austin reports persistent cough.  This is her fourth visit for same.  Cough symptoms started prior to August.  At first thought was due to a viral bronchitis.  Most recently she was treated with prednisone and a Z-Pak for bacterial bronchitis.  She reports that neither the prednisone nor the antibiotic changed the cough at all.  She describes coughing spasms.  She denies shortness of breath.  It is mostly dry although there has been some productive cough.  She denies chest pain.  Of note, she was complaining of groin and leg pain that is completely resolved after the use of prednisone.  She is never had fever.  No sinus pain or dental pain.  She otherwise feels well but cough interferes with her daily activities and nighttime sleep.  She denies symptoms of reflux. Assessment  1. Chronic cough   2. Chronic bilateral low back pain without sciatica      Plan   Persistent cough: Unclear etiology, treat for silent reflux with PPI.  Return for chest x-ray.  If not improving, will refer to pulmonology.  She has no symptoms of bronchospasm.  No relief  with prednisone.  Chronic back pain: Now improved after prednisone, question spinal stenosis.  If returns, recommend MRI.  Patient understands and agrees. I discussed the assessment and treatment plan with the patient. The patient was provided an opportunity to ask questions and all were answered. The patient agreed with the plan and demonstrated an understanding of the instructions.   The patient was advised to call back or seek an in-person evaluation if the symptoms worsen or if the condition fails to improve as anticipated. Follow up: No follow-ups on file.  Visit date not found  Meds ordered this encounter  Medications  . omeprazole (PRILOSEC) 20 MG capsule    Sig: Take 1 capsule (20 mg total) by mouth daily.    Dispense:  30 capsule    Refill:  3      I reviewed the patients updated PMH, FH, and SocHx.    Patient Active Problem List   Diagnosis Date Noted  . 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 Spondylosis of cervical region without myelopathy or radiculopathy 05/29/2018  . Family history of Parkinson disease 05/21/2016  . Seborrheic dermatitis 11/02/2014  . Osteopenia 07/13/2013  . Moderate episode of recurrent major depressive disorder ([place]) 06/22/2013  . Dermatitis, atopic 01/30/2013  . Menopausal hot flushes 01/30/2013  . MRSA cellulitis 01/30/2013  . 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 Insomnia 03/18/2012  . Hypothyroidism 12/18/2010   Current Meds  Medication Sig  . levothyroxine (SYNTHROID, LEVOTHROID) 50  MCG tablet Take 1 tablet (50 mcg total) by mouth daily.  Marland Kitchen LORazepam (ATIVAN) 0.5 MG tablet Take 1-2 tablets by mouth daily as needed.  . sertraline (ZOLOFT) 100 MG tablet Take 1 tablet by mouth daily.  . traZODone (DESYREL) 100 MG tablet Take 100 mg by mouth as needed for sleep.    Allergies: Patient has No Known Allergies. Family History: Patient family history includes Alcohol abuse in her brother and daughter; Asthma in her mother;  Depression in her daughter and mother; Heart disease in her father; Hyperlipidemia in her father; Lupus in her mother; Parkinson's disease in her mother; Stroke in her father. Social History:  Patient  reports that she has never smoked. She has never used smokeless tobacco. She reports current alcohol use. She reports that she does not use drugs.  Review of Systems: Constitutional: Negative for fever malaise or anorexia Cardiovascular: negative for chest pain Respiratory: negative for SOB or persistent cough Gastrointestinal: negative for abdominal pain  OBJECTIVE Vitals: There were no vitals taken for this visit. General: no acute distress , A&Ox3  Cheryl Ora, MD

## 2019-06-05 ENCOUNTER — Encounter: Payer: Self-pay | Admitting: Family Medicine

## 2019-06-05 ENCOUNTER — Ambulatory Visit (INDEPENDENT_AMBULATORY_CARE_PROVIDER_SITE_OTHER): Payer: Medicare Other | Admitting: Family Medicine

## 2019-06-05 DIAGNOSIS — M4807 Spinal stenosis, lumbosacral region: Secondary | ICD-10-CM | POA: Diagnosis not present

## 2019-06-05 DIAGNOSIS — M5136 Other intervertebral disc degeneration, lumbar region: Secondary | ICD-10-CM

## 2019-06-05 DIAGNOSIS — R053 Chronic cough: Secondary | ICD-10-CM

## 2019-06-05 DIAGNOSIS — M79604 Pain in right leg: Secondary | ICD-10-CM

## 2019-06-05 DIAGNOSIS — R05 Cough: Secondary | ICD-10-CM

## 2019-06-05 DIAGNOSIS — M79605 Pain in left leg: Secondary | ICD-10-CM

## 2019-06-05 MED ORDER — TRAMADOL HCL 50 MG PO TABS: 50.0000 mg | ORAL_TABLET | Freq: Four times a day (QID) | ORAL | 0 refills | Status: DC | PRN

## 2019-06-05 NOTE — Progress Notes (Signed)
Virtual Visit via Video Note  Subjective  CC:  Chief Complaint  Patient presents with  . Leg Pain    Bilateral legs, started about 4 weeks ago.. Worse at night  . Bronchitis    Reports that sxs still are coming/going, worse at night     I connected with [name] on 06/05/19 at  1:00 PM EDT by a video enabled telemedicine application and verified that I am speaking with the correct person using two identifiers. Location patient: Home Location provider: [facility] [place], Office Persons participating in the virtual visit: Kellyn Mccary, Leamon Arnt, MD Lilli Light, [place] discussed the limitations of evaluation and management by telemedicine and the availability of in person appointments. The patient expressed understanding and agreed to proceed. HPI: Cheryl Austin is a 78 y.o. female who was contacted today to address the problems listed above in the chief complaint. . Persistent nighttime lower leg pain below knee described as stabbing and stiff. Then am "stiffness" in upper thigh with pain improved with mvt. Pain is persistent and more bothersome. No weakness or b/b dysfunction. No low back pain. Describes upper back "stiffness" as well. No weakness. Nl ESR and labs recently. Has seen chiropractor w/o change in sxs . Chronic cough: have tried PPI, ABX, allergy meds w/o change in cough. See prior notes. Clear cxr. Negative covid testing.   Assessment  1. Bilateral leg pain   2. Other intervertebral disc degeneration, lumbar region   3. Spinal stenosis of lumbosacral region   4. Chronic cough      Plan   Leg pain and stiffness:  Doubt PMR with nl sed rate. Consider spinal stenosis given h/o DJD and age. MRI lumbar ordered. Start pain meds. F/u after results.   Cough, chronic: failed trials of treament. To pulm for further eval.  I discussed the assessment and treatment plan with the patient. The patient was provided an opportunity to ask  questions and all were answered. The patient agreed with the plan and demonstrated an understanding of the instructions.   The patient was advised to call back or seek an in-person evaluation if the symptoms worsen or if the condition fails to improve as anticipated. Follow up: prn  Visit date not found  Meds ordered this encounter  Medications  . traMADol (ULTRAM) 50 MG tablet    Sig: Take 1 tablet (50 mg total) by mouth every 6 (six) hours as needed for moderate pain.    Dispense:  30 tablet    Refill:  0      I reviewed the patients updated PMH, FH, and SocHx.    Patient Active Problem List   Diagnosis Date Noted  . 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 Spondylosis of cervical region without myelopathy or radiculopathy 05/29/2018  . Family history of Parkinson disease 05/21/2016  . Seborrheic dermatitis 11/02/2014  . Osteopenia 07/13/2013  . Moderate episode of recurrent major depressive disorder ([place]) 06/22/2013  . Dermatitis, atopic 01/30/2013  . Menopausal hot flushes 01/30/2013  . MRSA cellulitis 01/30/2013  . 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 Insomnia 03/18/2012  . Hypothyroidism 12/18/2010   Current Meds  Medication Sig  . levothyroxine (SYNTHROID, LEVOTHROID) 50 MCG tablet Take 1 tablet (50 mcg total) by mouth daily.  Marland Kitchen LORazepam (ATIVAN) 0.5 MG tablet Take 1-2 tablets by mouth daily as needed.  Marland Kitchen omeprazole (PRILOSEC) 20 MG capsule Take 1 capsule (20 mg total) by mouth daily.  Marland Kitchen  sertraline (ZOLOFT) 100 MG tablet Take 1 tablet by mouth daily.  . traZODone (DESYREL) 100 MG tablet Take 100 mg by mouth as needed for sleep.    Allergies: Patient has No Known Allergies. Family History: Patient family history includes Alcohol abuse in her brother and daughter; Asthma in her mother; Depression in her daughter and mother; Heart disease in her father; Hyperlipidemia in her father; Lupus in her mother; Parkinson's disease in her mother; Stroke in her father.  Social History:  Patient  reports that she has never smoked. She has never used smokeless tobacco. She reports current alcohol use. She reports that she does not use drugs.  Review of Systems: Constitutional: Negative for fever malaise or anorexia Cardiovascular: negative for chest pain Respiratory: negative for SOB or persistent cough Gastrointestinal: negative for abdominal pain  OBJECTIVE Vitals: There were no vitals taken for this visit. General: no acute distress , A&Ox3  Leamon Arnt, MD

## 2019-06-07 ENCOUNTER — Encounter: Payer: Self-pay | Admitting: Family Medicine

## 2019-06-08 ENCOUNTER — Ambulatory Visit
Admission: RE | Admit: 2019-06-08 | Discharge: 2019-06-08 | Disposition: A | Discharge status: Home / Self Care | Payer: Medicare Other | Source: Ambulatory Visit | Attending: Family Medicine | Admitting: Family Medicine

## 2019-06-08 ENCOUNTER — Telehealth: Payer: Self-pay | Admitting: Family Medicine

## 2019-06-08 DIAGNOSIS — M79604 Pain in right leg: Secondary | ICD-10-CM

## 2019-06-08 DIAGNOSIS — M25511 Pain in right shoulder: Secondary | ICD-10-CM

## 2019-06-08 DIAGNOSIS — M25512 Pain in left shoulder: Secondary | ICD-10-CM

## 2019-06-08 DIAGNOSIS — M5136 Other intervertebral disc degeneration, lumbar region: Secondary | ICD-10-CM

## 2019-06-08 DIAGNOSIS — M4807 Spinal stenosis, lumbosacral region: Secondary | ICD-10-CM

## 2019-06-08 DIAGNOSIS — M79605 Pain in left leg: Secondary | ICD-10-CM

## 2019-06-08 NOTE — Telephone Encounter (Signed)
I called and spoke to the patient. I let her know that the order had been sent to [facility] imaging and gave her their telephone number to call to schedule. She stated that her pain has gotten worse and that the pain medication is not helping.   Copied from [place] [id number]. Topic: Referral - Status >> Jun 08, 2019  9:54 AM Reyne Dumas L wrote: Reason for CRM:   Pt calling.  States things are getting worse and wants to know when her MRI has been scheduled for.  Tried to call Stephanie's line - no answer. Pt can be reached at [phone]

## 2019-06-08 NOTE — Telephone Encounter (Signed)
Please also refer to ortho for leg and shoulder pain.

## 2019-06-08 NOTE — Telephone Encounter (Signed)
See below, pt also has sent MyChart message

## 2019-06-10 ENCOUNTER — Ambulatory Visit (INDEPENDENT_AMBULATORY_CARE_PROVIDER_SITE_OTHER): Payer: Medicare Other | Admitting: Family Medicine

## 2019-06-10 ENCOUNTER — Encounter: Payer: Self-pay | Admitting: Family Medicine

## 2019-06-10 ENCOUNTER — Other Ambulatory Visit: Payer: Self-pay

## 2019-06-10 DIAGNOSIS — M79604 Pain in right leg: Secondary | ICD-10-CM | POA: Diagnosis not present

## 2019-06-10 DIAGNOSIS — M25512 Pain in left shoulder: Secondary | ICD-10-CM | POA: Diagnosis not present

## 2019-06-10 DIAGNOSIS — M25511 Pain in right shoulder: Secondary | ICD-10-CM | POA: Diagnosis not present

## 2019-06-10 DIAGNOSIS — M79605 Pain in left leg: Secondary | ICD-10-CM

## 2019-06-10 MED ORDER — PREDNISONE 10 MG PO TABS: ORAL_TABLET | ORAL | 0 refills | Status: DC

## 2019-06-10 NOTE — Patient Instructions (Signed)
   Possible polymyalgia rheumatica.

## 2019-06-10 NOTE — Progress Notes (Signed)
Office Visit Note   Patient: Cheryl Austin           Date of Birth: 1940-10-20           MRN: [id number] Visit Date: 06/10/2019 Requested by: [name], MD [address] [place],  [place] [address] PCP: [name], MD  Subjective: Chief Complaint  Patient presents with  . pain upper arms and bil thighs x 4 weeks    HPI: She is here with bilateral upper arm pain and bilateral thigh pain.  Symptoms started about 4 weeks ago, no injury.  No change in her activities to account for this.  She started noticing fairly severe discomfort in the muscles of her thighs and upper arms, making it difficult to transition from sitting to standing and to do basic activities around her house.  She is ordinarily a very active person.  She went to see Dr. Mardelle Matte in do some labs which were unrevealing, particularly a normal sed rate of 10.  She was given a short course of oral steroids which helped quite a bit initially.  Her symptoms and she is presently not taking anything for it, but for some reason in the past couple days her symptoms are improved.  Denies any fevers or chills, unintentional weight change, change in medications, change in diet.  No known family history of rheumatologic disease.               ROS:   All other systems were reviewed and are negative.  Objective: Vital Signs: There were no vitals taken for this visit.  Physical Exam:  General:  Alert and oriented, in no acute distress. Pulm:  Breathing unlabored. Psy:  Normal mood, congruent affect. Skin: No visible rash. Neck: Good range of motion with negative Spurling's test. Arms: Full range of motion of the shoulders, elbows and wrist with no joint effusions.  5/5 deltoid, rotator cuff, biceps, triceps, wrist and intrinsic hand strength.  Slight tenderness to palpation of the deltoid muscles, but no severe tenderness. Legs: Negative straight leg raise, 5/5 lower extremity strength and 2+ DTRs.  Slight tenderness  around the quadriceps muscles but no severe pain.  No pain with passive internal hip rotation.  Imaging: None today.  Assessment & Plan: 1.  Bilateral upper arm and upper leg pain, very suspicious for polymyalgia rheumatica despite normal sed rate. -As long as symptoms continue to subside, no further treatment needed.  But if she has a flareup of pain, she will take another course of oral prednisone.  She will keep in touch with me to let me know how she responds.  If she finishes that and symptoms come back, we will draw some additional labs.  Could contemplate rheumatology referral as well.     Procedures: No procedures performed  No notes on file     PMFS History: Patient Active Problem List   Diagnosis Date Noted  . 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 Spondylosis of cervical region without myelopathy or radiculopathy 05/29/2018  . Family history of Parkinson disease 05/21/2016  . Seborrheic dermatitis 11/02/2014  . Osteopenia 07/13/2013  . Moderate episode of recurrent major depressive disorder ([facility]) 06/22/2013  . Dermatitis, atopic 01/30/2013  . Menopausal hot flushes 01/30/2013  . MRSA cellulitis 01/30/2013  . 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 Insomnia 03/18/2012  . Hypothyroidism 12/18/2010   Past Medical History:  Diagnosis Date  . Anxiety   . 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 Family history of Parkinson disease 05/21/2016   Mother  . GERD (gastroesophageal reflux disease)   . Kidney failure    blood transfusion  . Moderate episode of recurrent major depressive disorder ([place])   . Osteoporosis   . Restless legs syndrome (RLS) 08/02/2017  . Thyroid disease    Hypothyroid    Family History  Problem Relation Age of Onset  . Lupus Mother   . Asthma Mother   . Parkinson's disease Mother   . Depression Mother   . Heart disease Father   . Hyperlipidemia Father   . Stroke Father   . Alcohol  abuse Brother   . Alcohol abuse Daughter   . Depression Daughter     Past Surgical History:  Procedure Laterality Date  . APPENDECTOMY  1961  . BREAST SURGERY Bilateral 1987 1998   Lumpectomy  . EYE SURGERY    . TOTAL ABDOMINAL HYSTERECTOMY  1986   BSO/Fibroids   Social History   Occupational History  . Occupation: Retired  Tobacco Use  . Smoking status: Never Smoker  . Smokeless tobacco: Never Used  Substance and Sexual Activity  . Alcohol use: Yes    Comment: 1-2 drinks per week  . Drug use: No  . Sexual activity: Never    Partners: Male    Birth control/protection: Post-menopausal

## 2019-06-22 ENCOUNTER — Telehealth: Payer: Self-pay | Admitting: Family Medicine

## 2019-06-22 ENCOUNTER — Encounter: Payer: Self-pay | Admitting: Family Medicine

## 2019-06-22 DIAGNOSIS — M25511 Pain in right shoulder: Secondary | ICD-10-CM

## 2019-06-22 DIAGNOSIS — M79604 Pain in right leg: Secondary | ICD-10-CM

## 2019-06-22 MED ORDER — MELOXICAM 15 MG PO TABS: 7.5000 mg | ORAL_TABLET | Freq: Every day | ORAL | 6 refills | Status: DC | PRN

## 2019-06-22 NOTE — Telephone Encounter (Signed)
Returned call to patient she advised she is almost out of the prednisone and asked what id her next plan of care.  Patient said she has 1 tablet left. Patient said advised she can feel the pain coming back in her legs and shoulders. Patient said the pain is not to bad yet. The number to contact patient is [phone]

## 2019-06-22 NOTE — Telephone Encounter (Signed)
FYI:  I called and advised the patient of there MyChart message. She said the prednisone gave her complete relief. She will be coming in tomorrow for the bloodwork.

## 2019-06-22 NOTE — Telephone Encounter (Signed)
Note sent through MyChart.

## 2019-06-23 ENCOUNTER — Other Ambulatory Visit: Payer: Self-pay

## 2019-06-23 ENCOUNTER — Other Ambulatory Visit: Payer: Self-pay | Admitting: Radiology

## 2019-06-23 ENCOUNTER — Ambulatory Visit (INDEPENDENT_AMBULATORY_CARE_PROVIDER_SITE_OTHER): Payer: Medicare Other

## 2019-06-23 DIAGNOSIS — M79604 Pain in right leg: Secondary | ICD-10-CM

## 2019-06-23 DIAGNOSIS — M25511 Pain in right shoulder: Secondary | ICD-10-CM

## 2019-06-23 DIAGNOSIS — M79605 Pain in left leg: Secondary | ICD-10-CM

## 2019-06-23 DIAGNOSIS — M25512 Pain in left shoulder: Secondary | ICD-10-CM

## 2019-06-23 MED ORDER — PREDNISONE 5 MG PO TABS: 15.0000 mg | ORAL_TABLET | Freq: Every day | ORAL | 1 refills | Status: DC

## 2019-06-23 NOTE — Progress Notes (Signed)
Blood work drawn per Dr. Junius Roads: uric acid, RF, CCP antibody, ANA, ESR, CRP & CK.

## 2019-06-24 ENCOUNTER — Telehealth: Payer: Self-pay | Admitting: Family Medicine

## 2019-06-24 NOTE — Telephone Encounter (Signed)
Labs including sed rate were again normal, but with dramatic response to prednisone (was on it for labs), still suspect PMR.  Will proceed with rheumatology referral.

## 2019-06-25 LAB — C-REACTIVE PROTEIN: CRP: 0.6 mg/L (ref <8.0)

## 2019-06-25 LAB — SEDIMENTATION RATE: Sed Rate: 2 mm/h (ref 0–30)

## 2019-06-25 LAB — CK: Total CK: 52 U/L (ref 29–143)

## 2019-06-25 LAB — CYCLIC CITRUL PEPTIDE ANTIBODY, IGG: Cyclic Citrullin Peptide Ab: <16 UNITS

## 2019-06-25 LAB — RHEUMATOID FACTOR: Rheumatoid fact SerPl-aCnc: <14 IU/mL (ref <14)

## 2019-06-25 LAB — URIC ACID: Uric Acid, Serum: 3.6 mg/dL (ref 2.5–7.0)

## 2019-06-25 LAB — ANA: Anti Nuclear Antibody (ANA): NEGATIVE

## 2019-07-03 NOTE — Progress Notes (Addendum)
Office Visit Note  Patient: Cheryl Austin             Date of Birth: October 02, 1940           MRN: [id number]             PCP: Leamon Arnt, MD Referring: Eunice Blase, MD Visit Date: 07/06/2019 Occupation: Retired, Network engineer  Subjective:  Pain in shoulders and hips.   History of Present Illness: Cheryl Austin is a 78 y.o. female seen in consultation per request of Dr. Junius Roads.  According to patient her symptoms are started on August 28 with increased pain in her lower extremities to the point that she was pushing herself out of the chair.  She states pain relief then moved to her shoulders and arms.  It lasted almost for a month.  She went to see her PCP who did some x-rays and lab work which were basically unremarkable.  She was given a prednisone taper on September 24 at that day her sed rate was 10.  The prednisone taper was given for 10 days.  She did not notice much improvement with the prednisone taper.  She states the symptoms got gradually got worse and she was having a lot of difficulty lifting her arms.  She was seen by Dr. Junius Roads who did some more studies including x-rays and MRI of her lumbar spine he also did some lab work which were basically unremarkable.  He placed on prednisone 49m and tapered over 14 days.  She states when she came down to 15 mg her symptoms recurred.  She discussed this further with Dr. HJunius Roadswho recommended her to stay on prednisone 15 mg p.o. daily.  She states she decided to reduce it to 10 mg and has been on 10 mg a day for the last 10 days.  She is having minimal stiffness but has been doing much better.  She denies any history of any joint pain or joint swelling.  Patient states that she was treated with methotrexate about 5 years ago due to dermatitis by a dermatologist.  Then the symptoms resolved and she has been off methotrexate.  There is family history of systemic lupus in her mother.  Activities of Daily Living:  Patient reports morning  stiffness for 30 minutes.   Patient Reports nocturnal pain.  Difficulty dressing/grooming: Denies Difficulty climbing stairs: Reports Difficulty getting out of chair: Reports Difficulty using hands for taps, buttons, cutlery, and/or writing: Denies  Review of Systems  Constitutional: Positive for fatigue. Negative for night sweats, weight gain and weight loss.  HENT: Negative for mouth sores, trouble swallowing, trouble swallowing, mouth dryness and nose dryness.   Eyes: Negative for pain, redness, itching, visual disturbance and dryness.  Respiratory: Negative for cough, shortness of breath and difficulty breathing.   Cardiovascular: Negative for chest pain, palpitations, hypertension, irregular heartbeat and swelling in legs/feet.  Gastrointestinal: Negative for blood in stool, constipation and diarrhea.  Endocrine: Negative for increased urination.  Genitourinary: Negative for difficulty urinating, painful urination and vaginal dryness.  Musculoskeletal: Positive for arthralgias, joint pain and morning stiffness. Negative for joint swelling, myalgias, muscle weakness, muscle tenderness and myalgias.  Skin: Negative for color change, rash, hair loss, skin tightness, ulcers and sensitivity to sunlight.  Allergic/Immunologic: Negative for susceptible to infections.  Neurological: Positive for weakness. Negative for dizziness, light-headedness, numbness, headaches, memory loss and night sweats.  Hematological: Negative for bruising/bleeding tendency and swollen glands.  Psychiatric/Behavioral: Negative for depressed mood, confusion and  sleep disturbance. The patient is not nervous/anxious.     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   Spondylosis of cervical region without myelopathy or radiculopathy 05/29/2018   Family history of Parkinson disease 05/21/2016   Seborrheic dermatitis 11/02/2014   Osteopenia  07/13/2013   Moderate episode of recurrent major depressive disorder ([place]) 06/22/2013   Dermatitis, atopic 01/30/2013   Menopausal hot flushes 01/30/2013   MRSA cellulitis 01/30/2013   Osteoarthritis, 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   Insomnia 03/18/2012   Hypothyroidism 12/18/2010    Past Medical History:  Diagnosis Date   Anxiety    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"  Steroids plaquinel   Family history of Parkinson disease 05/21/2016   Mother   GERD (gastroesophageal reflux disease)    Kidney failure    blood transfusion   Moderate episode of recurrent major depressive disorder (HCC)    Osteoporosis    Restless legs syndrome (RLS) 08/02/2017   Thyroid disease    Hypothyroid    Family History  Problem Relation Age of Onset   Lupus Mother    Asthma Mother    Parkinson's disease Mother    Depression Mother    Heart disease Father    Hyperlipidemia Father    Stroke Father    Alcohol abuse Daughter    Depression Daughter    Past Surgical History:  Procedure Laterality Date   APPENDECTOMY  1961   BREAST SURGERY Bilateral 1987 1998   Lumpectomy   EYE SURGERY     TOTAL ABDOMINAL HYSTERECTOMY  1986   BSO/Fibroids   Social History   Social History Narrative   Drinks 2-3 caffeine drinks a day    Immunization History  Administered Date(s) Administered   DT 05/21/2008   Influenza Split 06/20/2010, 05/14/2011, 06/18/2012, 06/22/2013, 07/02/2014, 07/05/2015   Influenza, High Dose Seasonal PF 06/18/2012, 06/18/2012, 06/22/2013, 06/22/2013, 07/02/2014, 07/02/2014, 07/05/2015, 07/05/2015, 05/21/2016, 05/21/2016, 08/02/2017   Influenza, Seasonal, Injecte, Preservative Fre 05/14/2011   Influenza,inj,Quad PF,6+ Mos 05/29/2018   Influenza,trivalent, recombinat, inj, PF 05/14/2011   Influenza-Unspecified 05/14/2011, 06/18/2012, 06/22/2013, 07/02/2014, 07/05/2015   Pneumococcal Conjugate-13 07/04/2014, 07/04/2014   Pneumococcal  Polysaccharide-23 03/20/2005, 06/22/2011   Pneumococcal-Unspecified 03/20/2005, 06/22/2011   Td 05/21/2008   Tdap 06/22/2011, 06/19/2013     Objective: Vital Signs: BP 117/74 (BP Location: Right Arm, Patient Position: Sitting, Cuff Size: Normal)    Pulse 76    Resp 13    Ht 5' 3"  (1.6 m)    Wt 116 lb (52.6 kg)    BMI 20.55 kg/m    Physical Exam Vitals signs and nursing note reviewed.  Constitutional:      Appearance: She is well-developed.  HENT:     Head: Normocephalic and atraumatic.  Eyes:     Conjunctiva/sclera: Conjunctivae normal.  Neck:     Musculoskeletal: Normal range of motion.  Cardiovascular:     Rate and Rhythm: Normal rate and regular rhythm.     Heart sounds: Normal heart sounds.  Pulmonary:     Effort: Pulmonary effort is normal.     Breath sounds: Normal breath sounds.  Abdominal:     General: Bowel sounds are normal.     Palpations: Abdomen is soft.  Lymphadenopathy:     Cervical: No cervical adenopathy.  Skin:    General: Skin is warm and dry.     Capillary Refill: Capillary refill takes less than 2 seconds.  Neurological:  Mental Status: She is alert and oriented to person, place, and time.     Comments: No temporal artery tenderness was noted.  Psychiatric:        Behavior: Behavior normal.      Musculoskeletal Exam: Patient had limited range of motion of his cervical spine.  Thoracic and lumbar spine were in good range of motion.  Shoulder joints elbow joints wrist joints with good range of motion.  She has DIP and PIP thickening in her hands with limited extension.  Hip joints, knee joints, ankles with good range of motion.  She had no muscular weakness or tenderness on examination today.  CDAI Exam: CDAI Score: -- Patient Global: --; Provider Global: -- Swollen: --; Tender: -- Joint Exam   No joint exam has been documented for this visit   There is currently no information documented on the homunculus. Go to the Rheumatology activity  and complete the homunculus joint exam.  Investigation:  May 14, 2019 ESR 10, CBC normal, CMP normal, vitamin D 28.47 Findings:  05/14/19: Vitamin D 28.47, TSH 0.73 06/23/19: ANA-, ESR 2, CRP 0.6, CK 52, uric acid 3.6, RF<14, CCP<16  Component     Latest Ref Rng & Units 05/14/2019 06/23/2019  VITD     30.00 - 100.00 ng/mL 28.47 (L)   TSH     0.35 - 4.50 uIU/mL 0.73   Uric Acid, Serum     2.5 - 7.0 mg/dL  3.6  RA Latex Turbid.     <83 IU/mL  <15  Cyclic Citrullin Peptide Ab     UNITS  <16  Anti Nuclear Antibody (ANA)     NEGATIVE  NEGATIVE  Sed Rate     0 - 30 mm/h  2  CRP     <8.0 mg/L  0.6  CK Total     29 - 143 U/L  52  August 09, 2018 DEXA showed T score of -2.3 in the right femoral neck. May 25, 2019 chest x-ray normal. Imaging: Mr Lumbar Spine Wo Contrast  Result Date: 06/09/2019 CLINICAL DATA:  Initial evaluation for low back pain with pain and numbness in both legs for 1 month. EXAM: MRI LUMBAR SPINE WITHOUT CONTRAST TECHNIQUE: Multiplanar, multisequence MR imaging of the lumbar spine was performed. No intravenous contrast was administered. COMPARISON:  Prior radiograph from 03/25/2019. FINDINGS: Segmentation: Standard. Lowest well-formed disc labeled the L5-S1 level. Alignment: Trace retrolisthesis of L2 on L3. Alignment otherwise normal with preservation of the normal lumbar lordosis. Vertebrae: Vertebral body height maintained without evidence for acute or chronic fracture. Endplate Schmorl's node deformity with mild reactive endplate changes present about the L2-3 interspace. Underlying bone marrow signal intensity mildly heterogeneous but overall within normal limits. Superimposed approximate 1 cm benign hemangioma noted within the L5 vertebral body. No other discrete or worrisome osseous lesions. No other abnormal marrow edema. Conus medullaris and cauda equina: Conus extends to the T12-L1 level. Conus and cauda equina appear normal. Paraspinal and other soft  tissues: Paraspinous soft tissues within normal limits. Visualized visceral structures unremarkable. Disc levels: T11-12: Unremarkable. T12-L1: Small right paracentral disc protrusion indents the right ventral thecal sac. No significant spinal stenosis. Foramina remain patent. L1-2: Mild annular disc bulge with disc desiccation. Superimposed mild bilateral facet hypertrophy. No canal or foraminal stenosis. L2-3: Trace retrolisthesis. Circumferential disc bulge with disc desiccation and intervertebral disc space narrowing. Mild reactive endplate changes. Moderate facet and ligament flavum hypertrophy. No significant canal or lateral recess stenosis. Mild bilateral L2 foraminal narrowing. No  impingement. L3-4: Mild annular disc bulge with disc desiccation. Moderate facet and ligament flavum hypertrophy. Resultant mild spinal stenosis. Mild bilateral L3 foraminal narrowing. L4-5: Mild annular disc bulge with disc desiccation. Moderate facet and ligament flavum hypertrophy. Resultant mild spinal stenosis. Mild bilateral L4 foraminal narrowing. No frank impingement. L5-S1: Chronic intervertebral disc space narrowing with mild diffuse disc bulge. Mild facet hypertrophy. No significant canal or foraminal stenosis. IMPRESSION: 1. Mild disc bulging with facet hypertrophy at L2-3, L3-4, and L4-5 with resultant mild canal, with mild bilateral L2 through L4 foraminal stenosis. No frank impingement. 2. Small right paracentral disc protrusion at T12-L1 without stenosis or impingement. Electronically Signed   By: Jeannine Boga M.D.   On: 06/09/2019 05:30    Recent Labs: Lab Results  Component Value Date   WBC 5.8 05/14/2019   HGB 13.3 05/14/2019   PLT 272.0 05/14/2019   NA 136 05/14/2019   K 4.2 05/14/2019   CL 101 05/14/2019   CO2 27 05/14/2019   GLUCOSE 84 05/14/2019   BUN 13 05/14/2019   CREATININE 0.65 05/14/2019   BILITOT 0.5 05/14/2019   ALKPHOS 91 05/14/2019   AST 27 05/14/2019   ALT 22 05/14/2019    PROT 6.2 05/14/2019   ALBUMIN 4.3 05/14/2019   CALCIUM 9.2 05/14/2019    Speciality Comments: No specialty comments available.  Procedures:  No procedures performed Allergies: Patient has no known allergies.   Assessment / Plan:     Visit Diagnoses: Polymyalgia rheumatica (HCC)-patient gives history of proximal muscle tenderness and weakness which has been going on since August 2020.  The symptoms have been progressively getting worse.  She had been given 2 courses of prednisone.  The first dose was not as effective.  The second course has been very effective.  She has reduced her dose of prednisone herself from 15 to 10 mg.  She has been on the same dose for the last 10 days.  She states with the prednisone she has no muscular weakness or tenderness.  Unfortunately I do not have a baseline sedimentation rate or CK.  She had no temporal artery tenderness on examination today.  I have advised her to stay on prednisone 10 mg p.o. daily for a month.  After that we will try to taper prednisone by 1 mg every month.  I will also start her on methotrexate as a steroid sparing agent.  She has taken methotrexate in the past and is quite familiar with that.  She is taken it for dermatitis.  The plan is to obtain some baseline labs today.  If the labs are available I will start her on methotrexate 6 tablets p.o. weekly.  And keep her on the same dose.  She will be on folic acid 1 mg p.o. daily.  She will have labs in 2 weeks, 2 months and then every 3 months.  High risk medication use - Plan: Hepatitis B core antibody, IgM, Hepatitis B surface antigen, Hepatitis C antibody, HIV antibody, QuantiFERON-TB Gold Plus, Serum protein electrophoresis with reflex, Immunoglobulins  Primary osteoarthritis of both hands-she currently does not have much discomfort.  Spondylosis of cervical region without myelopathy or radiculopathy-she has limited range of motion without much discomfort or radiculopathy.  Osteopenia  of multiple sites-I reviewed her bone density.  Her T score was -2.3.  As she will be taking long-term prednisone it would be better for her to start on prophylactic bisphosphonates.  I discussed the option of Fosamax.  Patient was in agreement.  Side effects were discussed.  The plan is to start on Fosamax 70 mg p.o. weekly.  Use of calcium and vitamin D was also emphasized.  Patient would like to start Fosamax at the follow-up visit.  Vitamin D deficiency-I have advised her to take vitamin D 2000 units daily.  Seborrheic dermatitis-she was followed by dermatologist.  History of hypothyroidism  History of MRSA infection  History of depression  Other insomnia  Family history of Parkinson disease  Family history of systemic lupus erythematosus (SLE) in mother  Orders: Orders Placed This Encounter  Procedures   Hepatitis B core antibody, IgM   Hepatitis B surface antigen   Hepatitis C antibody   HIV antibody   QuantiFERON-TB Gold Plus   Serum protein electrophoresis with reflex   Immunoglobulins   CBC with Differential/Platelet   COMPLETE METABOLIC PANEL WITH GFR   Meds ordered this encounter  Medications   predniSONE (DELTASONE) 5 MG tablet    Sig: Take 1 tablet (5 mg total) by mouth daily with breakfast. Take with four 1 mg tablets to equal 9 mg daily total for 30 days.    Dispense:  30 tablet    Refill:  0   predniSONE (DELTASONE) 1 MG tablet    Sig: Take 4 tablets (4 mg total) by mouth daily with breakfast.    Dispense:  120 tablet    Refill:  0    Face-to-face time spent with patient was 60 minutes. Greater than 50% of time was spent in counseling and coordination of care.  Follow-Up Instructions: Return for PMR, OA, osteopenia.   Bo Merino, MD  Note - This record has been created using Editor, commissioning.  Chart creation errors have been sought, but may not always  have been located. Such creation errors do not reflect on  the standard of  medical care.

## 2019-07-06 ENCOUNTER — Encounter: Payer: Self-pay | Admitting: Rheumatology

## 2019-07-06 ENCOUNTER — Other Ambulatory Visit: Payer: Self-pay

## 2019-07-06 ENCOUNTER — Ambulatory Visit: Payer: Medicare Other | Admitting: Rheumatology

## 2019-07-06 VITALS — BP 117/74 | HR 76 | Resp 13 | Ht 63.0 in | Wt 116.0 lb

## 2019-07-06 DIAGNOSIS — Z79899 Other long term (current) drug therapy: Secondary | ICD-10-CM

## 2019-07-06 DIAGNOSIS — M8589 Other specified disorders of bone density and structure, multiple sites: Secondary | ICD-10-CM

## 2019-07-06 DIAGNOSIS — M19041 Primary osteoarthritis, right hand: Secondary | ICD-10-CM | POA: Diagnosis not present

## 2019-07-06 DIAGNOSIS — Z820 Family history of epilepsy and other diseases of the nervous system: Secondary | ICD-10-CM

## 2019-07-06 DIAGNOSIS — M353 Polymyalgia rheumatica: Secondary | ICD-10-CM

## 2019-07-06 DIAGNOSIS — E559 Vitamin D deficiency, unspecified: Secondary | ICD-10-CM

## 2019-07-06 DIAGNOSIS — Z8639 Personal history of other endocrine, nutritional and metabolic disease: Secondary | ICD-10-CM

## 2019-07-06 DIAGNOSIS — Z8269 Family history of other diseases of the musculoskeletal system and connective tissue: Secondary | ICD-10-CM

## 2019-07-06 DIAGNOSIS — L219 Seborrheic dermatitis, unspecified: Secondary | ICD-10-CM

## 2019-07-06 DIAGNOSIS — Z8659 Personal history of other mental and behavioral disorders: Secondary | ICD-10-CM

## 2019-07-06 DIAGNOSIS — G4709 Other insomnia: Secondary | ICD-10-CM

## 2019-07-06 DIAGNOSIS — M47812 Spondylosis without myelopathy or radiculopathy, cervical region: Secondary | ICD-10-CM

## 2019-07-06 DIAGNOSIS — M19042 Primary osteoarthritis, left hand: Secondary | ICD-10-CM

## 2019-07-06 DIAGNOSIS — Z8614 Personal history of Methicillin resistant Staphylococcus aureus infection: Secondary | ICD-10-CM

## 2019-07-06 MED ORDER — PREDNISONE 5 MG PO TABS: 5.0000 mg | ORAL_TABLET | Freq: Every day | ORAL | 0 refills | Status: AC

## 2019-07-06 MED ORDER — PREDNISONE 1 MG PO TABS: 4.0000 mg | ORAL_TABLET | Freq: Every day | ORAL | 0 refills | Status: DC

## 2019-07-06 NOTE — Patient Instructions (Addendum)
We will discuss treatment options for osteoporosis at your next office visit.  Please start taking prednisone 5 mg tablets daily and 4 1 mg tablets daily for 30 days then decrease to 5 mg tablets with 3 1 mg tablets daily.  Standing Labs We placed an order today for your standing lab work.    Please come back and get your standing labs in 2 weeks, 4 weeks, 8 weeks, and then every 3 months.  We have open lab daily Monday through Thursday from 8:30-12:30 PM and 1:30-4:30 PM and Friday from 8:30-12:30 PM and 1:30-4:00 PM at the office of Dr. Pollyann SavoyShaili Deveshwar.   You may experience shorter wait times on Monday and Friday afternoons. The office is located at [address], [address], [place], [place] [address] No appointment is necessary.   Labs are drawn by [facility].  You may receive a bill from [facility] for your lab work.  If you wish to have your labs drawn at another location, please call the office 24 hours in advance to send orders.  If you have any questions regarding directions or hours of operation,  please call [phone].   Just as a reminder please drink plenty of water prior to coming for your lab work. Thanks!  Vaccines You are taking a medication(s) that can suppress your immune system.  The following immunizations are recommended: . Flu annually . Pneumonia (Pneumovax 23 and Prevnar 13 spaced at least 1 year apart) . Shingrix  Please check with your PCP to make sure you are up to date.   Methotrexate tablets What is this medicine? METHOTREXATE (METH oh TREX ate) is a chemotherapy drug used to treat cancer including breast cancer, leukemia, and lymphoma. This medicine can also be used to treat psoriasis and certain kinds of arthritis. This medicine may be used for other purposes; ask your health care provider or pharmacist if you have questions. COMMON BRAND NAME(S): Rheumatrex, Trexall What should I tell my health care provider before I take this medicine? They need  to know if you have any of these conditions:  fluid in the stomach area or lungs  if you often drink alcohol  infection or immune system problems  kidney disease or on hemodialysis  liver disease  low blood counts, like low white cell, platelet, or red cell counts  lung disease  radiation therapy  stomach ulcers  ulcerative colitis  an unusual or allergic reaction to methotrexate, other medicines, foods, dyes, or preservatives  pregnant or trying to get pregnant  breast-feeding How should I use this medicine? Take this medicine by mouth with a glass of water. Follow the directions on the prescription label. Take your medicine at regular intervals. Do not take it more often than directed. Do not stop taking except on your doctor's advice. Make sure you know why you are taking this medicine and how often you should take it. If this medicine is used for a condition that is not cancer, like arthritis or psoriasis, it should be taken weekly, NOT daily. Taking this medicine more often than directed can cause serious side effects, even death. Talk to your healthcare provider about safe handling and disposal of this medicine. You may need to take special precautions. Talk to your pediatrician regarding the use of this medicine in children. While this drug may be prescribed for selected conditions, precautions do apply. Overdosage: If you think you have taken too much of this medicine contact a poison control center or emergency room at once.  NOTE: This medicine is only for you. Do not share this medicine with others. What if I miss a dose? If you miss a dose, talk with your doctor or health care professional. Do not take double or extra doses. What may interact with this medicine? This medicine may interact with the following medication:  acitretin  aspirin and aspirin-like medicines including salicylates  azathioprine  certain antibiotics like penicillins, tetracycline, and  chloramphenicol  cyclosporine  gold  hydroxychloroquine  live virus vaccines  NSAIDs, medicines for pain and inflammation, like ibuprofen or naproxen  other cytotoxic agents  penicillamine  phenylbutazone  phenytoin  probenecid  retinoids such as isotretinoin and tretinoin  steroid medicines like prednisone or cortisone  sulfonamides like sulfasalazine and trimethoprim/sulfamethoxazole  theophylline This list may not describe all possible interactions. Give your health care provider a list of all the medicines, herbs, non-prescription drugs, or dietary supplements you use. Also tell them if you smoke, drink alcohol, or use illegal drugs. Some items may interact with your medicine. What should I watch for while using this medicine? Avoid alcoholic drinks. This medicine can make you more sensitive to the sun. Keep out of the sun. If you cannot avoid being in the sun, wear protective clothing and use sunscreen. Do not use sun lamps or tanning beds/booths. You may need blood work done while you are taking this medicine. Call your doctor or health care professional for advice if you get a fever, chills or sore throat, or other symptoms of a cold or flu. Do not treat yourself. This drug decreases your body's ability to fight infections. Try to avoid being around people who are sick. This medicine may increase your risk to bruise or bleed. Call your doctor or health care professional if you notice any unusual bleeding. Check with your doctor or health care professional if you get an attack of severe diarrhea, nausea and vomiting, or if you sweat a lot. The loss of too much body fluid can make it dangerous for you to take this medicine. Talk to your doctor about your risk of cancer. You may be more at risk for certain types of cancers if you take this medicine. Both men and women must use effective birth control with this medicine. Do not become pregnant while taking this medicine or  until at least 1 normal menstrual cycle has occurred after stopping it. Women should inform their doctor if they wish to become pregnant or think they might be pregnant. Men should not father a child while taking this medicine and for 3 months after stopping it. There is a potential for serious side effects to an unborn child. Talk to your health care professional or pharmacist for more information. Do not breast-feed an infant while taking this medicine. What side effects may I notice from receiving this medicine? Side effects that you should report to your doctor or health care professional as soon as possible:  allergic reactions like skin rash, itching or hives, swelling of the face, lips, or tongue  breathing problems or shortness of breath  diarrhea  dry, nonproductive cough  low blood counts - this medicine may decrease the number of white blood cells, red blood cells and platelets. You may be at increased risk for infections and bleeding.  mouth sores  redness, blistering, peeling or loosening of the skin, including inside the mouth  signs of infection - fever or chills, cough, sore throat, pain or trouble passing urine  signs and symptoms of bleeding such as  bloody or black, tarry stools; red or dark-brown urine; spitting up blood or brown material that looks like coffee grounds; red spots on the skin; unusual bruising or bleeding from the eye, gums, or nose  signs and symptoms of kidney injury like trouble passing urine or change in the amount of urine  signs and symptoms of liver injury like dark yellow or brown urine; general ill feeling or flu-like symptoms; light-colored stools; loss of appetite; nausea; right upper belly pain; unusually weak or tired; yellowing of the eyes or skin Side effects that usually do not require medical attention (report to your doctor or health care professional if they continue or are bothersome):  dizziness  hair loss  tiredness  upset  stomach  vomiting This list may not describe all possible side effects. Call your doctor for medical advice about side effects. You may report side effects to FDA at 1-800-FDA-1088. Where should I keep my medicine? Keep out of the reach of children. Store at room temperature between 20 and 25 degrees C (68 and 77 degrees F). Protect from light. Throw away any unused medicine after the expiration date. NOTE: This sheet is a summary. It may not cover all possible information. If you have questions about this medicine, talk to your doctor, pharmacist, or health care provider.  2020 Elsevier/Gold Standard (2017-03-28 13:38:43)

## 2019-07-06 NOTE — Progress Notes (Signed)
Pharmacy Note  Subjective: Patient presents today to [facility] for follow up office visit. Patient seen by the pharmacist for counseling on methotrexate for polymyalgia rheumatica. She has been treated with prednisone and is currently on 10 mg daily.  She has tried methotrexate in the past for a "skin condition".  Objective: CBC    Component Value Date/Time   WBC 5.8 05/14/2019 0917   RBC 4.44 05/14/2019 0917   HGB 13.3 05/14/2019 0917   HCT 39.4 05/14/2019 0917   PLT 272.0 05/14/2019 0917   MCV 88.7 05/14/2019 0917   MCHC 33.7 05/14/2019 0917   RDW 13.5 05/14/2019 0917   LYMPHSABS 1.3 05/14/2019 0917   MONOABS 0.4 05/14/2019 0917   EOSABS 0.1 05/14/2019 0917   BASOSABS 0.0 05/14/2019 0917    CMP     Component Value Date/Time   NA 136 05/14/2019 0917   K 4.2 05/14/2019 0917   CL 101 05/14/2019 0917   CO2 27 05/14/2019 0917   GLUCOSE 84 05/14/2019 0917   BUN 13 05/14/2019 0917   CREATININE 0.65 05/14/2019 0917   CALCIUM 9.2 05/14/2019 0917   PROT 6.2 05/14/2019 0917   ALBUMIN 4.3 05/14/2019 0917   AST 27 05/14/2019 0917   ALT 22 05/14/2019 0917   ALKPHOS 91 05/14/2019 0917   BILITOT 0.5 05/14/2019 0917    Baseline Immunosuppressant Therapy Labs TB GOLD: pending 07/06/2019  Hepatitis Panel: pending 07/06/2019  HIV: pending 07/06/2019  Immunoglobulins: pending 07/06/2019  SPEP: pending 07/06/2019  G6PD No results found for: G6PDH TPMT No results found for: TPMT   Chest-xray: no active cardiopulmonary disease 07/29/2009  Contraception: hysterectomy  Alcohol use: N/A  Assessment/Plan:   Patient was counseled on the purpose, proper use, and adverse effects of methotrexate including nausea, infection, and signs and symptoms of pneumonitis. Discussed that there is the possibility of an increased risk of malignancy, specifically lymphomas, but it is not well understood if this increased risk is due to the medication or the disease state.   Instructed patient that medication should be held for infection and prior to surgery.  Advised patient to avoid live vaccines. Recommend annual influenza, Pneumovax 23, Prevnar 13, and Shingrix as indicated.   Reviewed instructions with patient to take methotrexate weekly along with folic acid daily.  Discussed the importance of frequent monitoring of kidney and liver function and blood counts, and provided patient with standing lab instructions.  Counseled patient to avoid NSAIDs and alcohol while on methotrexate.  Provided patient with educational materials on methotrexate and answered all questions.   Patient voiced understanding.  Patient consented to methotrexate use.  Will upload into chart.    Dose of methotrexate will be 6 tablets every 7 days along with folic acid 1 mg 2 tablets daily. Prescription pending lab results.  Patient is to taper prednisone by 1 mg every 30 days.  Updated prescription sent to pharmacy.  She has history of chronic prednisone use.  Her last DEXA in November 2019 showed lowest T-score of -2.3.  She is at high risk of fracture.  Will discuss osteoporosis treatment options at next office visit.  All questions encouraged and answered.  Instructed patient to call with any other questions or concerns.  Mariella Saa, PharmD, [place], [place] Clinical Specialty Pharmacist [phone]  07/06/2019 11:02 AM

## 2019-07-07 ENCOUNTER — Encounter: Payer: Self-pay | Admitting: Rheumatology

## 2019-07-08 LAB — PROTEIN ELECTROPHORESIS, SERUM, WITH REFLEX
Albumin ELP: 4 g/dL (ref 3.8–4.8)
Alpha 1: 0.3 g/dL (ref 0.2–0.3)
Alpha 2: 0.6 g/dL (ref 0.5–0.9)
Beta 2: 0.2 g/dL (ref 0.2–0.5)
Beta Globulin: 0.4 g/dL (ref 0.4–0.6)
Gamma Globulin: 0.6 g/dL — ABNORMAL LOW (ref 0.8–1.7)
Total Protein: 6.1 g/dL (ref 6.1–8.1)

## 2019-07-08 LAB — HIV ANTIBODY (ROUTINE TESTING W REFLEX): HIV 1&2 Ab, 4th Generation: NONREACTIVE

## 2019-07-08 LAB — HEPATITIS B CORE ANTIBODY, IGM: Hep B C IgM: NONREACTIVE

## 2019-07-08 LAB — HEPATITIS C ANTIBODY
Hepatitis C Ab: NONREACTIVE
SIGNAL TO CUT-OFF: 0.01 (ref <1.00)

## 2019-07-08 LAB — IGG, IGA, IGM
IgG (Immunoglobin G), Serum: 716 mg/dL (ref 600–1540)
IgM, Serum: 78 mg/dL (ref 50–300)
Immunoglobulin A: 128 mg/dL (ref 70–320)

## 2019-07-08 LAB — QUANTIFERON-TB GOLD PLUS
Mitogen-NIL: >10 IU/mL
NIL: 0.03 IU/mL
QuantiFERON-TB Gold Plus: NEGATIVE
TB1-NIL: 0.05 IU/mL
TB2-NIL: 0.05 IU/mL

## 2019-07-08 LAB — HEPATITIS B SURFACE ANTIGEN: Hepatitis B Surface Ag: NONREACTIVE

## 2019-07-08 NOTE — Progress Notes (Signed)
The plan was to start patient on methotrexate after the lab work.

## 2019-07-09 NOTE — Telephone Encounter (Signed)
Attempted to contact patient and left message for patient to call the office.  

## 2019-07-10 ENCOUNTER — Other Ambulatory Visit: Payer: Self-pay

## 2019-07-10 MED ORDER — FOLIC ACID 1 MG PO TABS: 1.0000 mg | ORAL_TABLET | Freq: Every day | ORAL | 2 refills | Status: DC

## 2019-07-10 MED ORDER — METHOTREXATE 2.5 MG PO TABS: ORAL_TABLET | ORAL | 0 refills | Status: DC

## 2019-07-10 NOTE — Progress Notes (Signed)
Patient called to clarify prednisone dosage, discussed that she should be tapering by 1mg  each month. Patient verbalized understanding.    Also, sent in prescription for methotrexate and folic acid to [facility] in [place]. Advised patient to return for labs in 2 weeks after starting methotrexate.

## 2019-07-27 NOTE — Progress Notes (Signed)
Virtual Visit via Telephone Note  I connected with Cheryl Austin on 07/28/19 at  1:30 PM EST by telephone and verified that I am speaking with the correct person using two identifiers.  Location: Patient: Home  Provider: Clinic  This service was conducted via virtual visit.  The patient was located at home. I was located in my office.  Consent was obtained prior to the virtual visit and is aware of possible charges through their insurance for this visit.  The patient is an established patient.  Dr. Estanislado Pandy, MD conducted the virtual visit and Hazel Sams, PA-C acted as scribe during the service.  Office staff helped with scheduling follow up visits after the service was conducted.   I discussed the limitations, risks, security and privacy concerns of performing an evaluation and management service by telephone and the availability of in person appointments. I also discussed with the patient that there may be a patient responsible charge related to this service. The patient expressed understanding and agreed to proceed.  CC: Discuss medications  History of Present Illness: Patient is a 78 year old female with a past medical history of PMR and osteoarthritis.  She is taking Methotrexate 6 tablets by mouth once weekly and folic acid 1 mg po daily, which she started 2 weeks ago.  She has noticed increased hair loss recently. She is taking prednisone 8 mg po daily.  She is not having any increased muscle weakness or muscle tenderness at this time.  She is not having any difficulty getting up from a chair or raising her arms above her head.     Review of Systems  Constitutional: Negative for fever and malaise/fatigue.  HENT:       +Dry mouth  Eyes: Negative for photophobia, pain, discharge and redness.  Respiratory: Negative for cough, shortness of breath and wheezing.   Cardiovascular: Negative for chest pain and palpitations.  Gastrointestinal: Negative for blood in stool, constipation and  diarrhea.  Genitourinary: Negative for dysuria.  Musculoskeletal: Negative for back pain, joint pain, myalgias and neck pain.       +Morning stiffness   Skin: Negative for rash.       +Hair loss  Neurological: Negative for dizziness and headaches.  Psychiatric/Behavioral: Positive for depression. The patient is nervous/anxious. The patient does not have insomnia.       Observations/Objective: Physical Exam  Constitutional: She is oriented to person, place, and time.  Neurological: She is alert and oriented to person, place, and time.  Psychiatric: Mood, memory, affect and judgment normal.   Patient reports morning stiffness for 10 minutes.   Patient denies nocturnal pain.  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 She was started on MTX 6 tablet by mouth once weekly and folic acid 1 mg po daily 2 weeks ago.  She is tolerating MTX, but she has noticed increased hair loss, so we discussed treatment options.  We discussed that MTX is used as a steroid sparing agent. She will continue taking MTX 6 tablets po once weekly and folic acid 1 mg po daily.  She has reduced prednisone from 10 mg daily to 8 mg po daily since  her initial visit on 07/06/19.  She has noticed significant improvement with this combination of medications. We discussed tapering prednisone by 1 mg every month. She will follow up in 6-8 weeks.   High risk medication use - Methotrexate 6 tablets by mouth once weekly and folic acid 1 mg po daily. She is due to update CBC and CMP.  Standing  orders are in place.  Primary osteoarthritis of both hands-She has no joint pain or joint swelling at this time. She is no longer taking meloxicam. Joint protection and muscle strengthening were discussed.   Spondylosis of cervical region without myelopathy or radiculopathy-She has no lower back pain at this time.  Osteopenia of multiple sites-I reviewed her bone density.  Her T score was -2.3.  As she will be taking long-term prednisone it would be better for her to start on prophylactic bisphosphonates.  The plan is to start on Fosamax 70 mg p.o. weekly. Potential side effects were discussed.  Instructions were provided. A prescription for fosamax will be sent to the pharmacy today.   Vitamin D deficiency-She was encouraged to take a vitamin D supplement.   Other medical conditions are listed as follows:   Seborrheic dermatitis-she was followed by dermatologist.  History of hypothyroidism  History of MRSA infection  History of depression  Other insomnia  Family history of Parkinson disease  Family history of systemic lupus erythematosus (SLE) in mother  Follow Up Instructions: She will follow up in 6-8 weeks.    I discussed the assessment and treatment plan with the patient. The patient was provided an opportunity to ask questions and all were answered. The patient agreed with the plan and demonstrated an understanding of the instructions.   The patient was advised to call back or seek an in-person evaluation if the symptoms worsen or if the condition fails to improve as anticipated.  I provided 25 minutes of non-face-to-face time during this encounter.   Pollyann Savoy, MD   Scribed by-  Sherron Ales, PA-C

## 2019-07-28 ENCOUNTER — Telehealth (INDEPENDENT_AMBULATORY_CARE_PROVIDER_SITE_OTHER): Payer: Medicare Other | Admitting: Rheumatology

## 2019-07-28 ENCOUNTER — Other Ambulatory Visit: Payer: Self-pay | Admitting: *Deleted

## 2019-07-28 ENCOUNTER — Other Ambulatory Visit: Payer: Self-pay

## 2019-07-28 ENCOUNTER — Encounter: Payer: Self-pay | Admitting: Rheumatology

## 2019-07-28 DIAGNOSIS — M47812 Spondylosis without myelopathy or radiculopathy, cervical region: Secondary | ICD-10-CM

## 2019-07-28 DIAGNOSIS — M8589 Other specified disorders of bone density and structure, multiple sites: Secondary | ICD-10-CM

## 2019-07-28 DIAGNOSIS — Z79899 Other long term (current) drug therapy: Secondary | ICD-10-CM

## 2019-07-28 DIAGNOSIS — Z8639 Personal history of other endocrine, nutritional and metabolic disease: Secondary | ICD-10-CM

## 2019-07-28 DIAGNOSIS — Z8614 Personal history of Methicillin resistant Staphylococcus aureus infection: Secondary | ICD-10-CM

## 2019-07-28 DIAGNOSIS — M353 Polymyalgia rheumatica: Secondary | ICD-10-CM

## 2019-07-28 DIAGNOSIS — M19042 Primary osteoarthritis, left hand: Secondary | ICD-10-CM | POA: Diagnosis not present

## 2019-07-28 DIAGNOSIS — L219 Seborrheic dermatitis, unspecified: Secondary | ICD-10-CM

## 2019-07-28 DIAGNOSIS — Z8269 Family history of other diseases of the musculoskeletal system and connective tissue: Secondary | ICD-10-CM

## 2019-07-28 DIAGNOSIS — M19041 Primary osteoarthritis, right hand: Secondary | ICD-10-CM

## 2019-07-28 DIAGNOSIS — Z820 Family history of epilepsy and other diseases of the nervous system: Secondary | ICD-10-CM

## 2019-07-28 DIAGNOSIS — G4709 Other insomnia: Secondary | ICD-10-CM

## 2019-07-28 DIAGNOSIS — Z8659 Personal history of other mental and behavioral disorders: Secondary | ICD-10-CM

## 2019-07-28 MED ORDER — ALENDRONATE SODIUM 70 MG PO TABS: 70.0000 mg | ORAL_TABLET | ORAL | 0 refills | Status: DC

## 2019-07-29 ENCOUNTER — Telehealth: Payer: Medicare Other | Admitting: Rheumatology

## 2019-08-03 ENCOUNTER — Telehealth: Payer: Self-pay | Admitting: Family Medicine

## 2019-08-03 NOTE — Telephone Encounter (Signed)
I left a message asking the patient to call and schedule Medicare AWV with Courtney ([facility]).  If patient calls back, please schedule Medicare Wellness Visit (initial) at next available opening.  VDM (Dee-Dee) 

## 2019-08-04 ENCOUNTER — Other Ambulatory Visit: Payer: Self-pay

## 2019-08-04 ENCOUNTER — Other Ambulatory Visit: Payer: Self-pay | Admitting: Rheumatology

## 2019-08-04 DIAGNOSIS — Z79899 Other long term (current) drug therapy: Secondary | ICD-10-CM

## 2019-08-04 DIAGNOSIS — M353 Polymyalgia rheumatica: Secondary | ICD-10-CM

## 2019-08-04 NOTE — Telephone Encounter (Addendum)
Last Visit: 07/28/2019 telemedicine  Next Visit: message sent to the front desk to schedule.  Labs: 08/06/2019 Glucose is 112. Sodium borderline low-133. Rest of CMP WNL. CBC WNL.  Okay to refill per Dr. Estanislado Pandy.

## 2019-08-04 NOTE — Telephone Encounter (Addendum)
Last Visit: 07/28/2019 telemedicine  Next Visit: message sent to the front desk to schedule.   Tapering prednisone by 1mg  each month.  Per patient, she will be decreasing to 7mg .   Okay to refill per Dr. Estanislado Pandy.

## 2019-08-05 ENCOUNTER — Ambulatory Visit: Payer: Medicare Other | Admitting: Rheumatology

## 2019-08-06 ENCOUNTER — Other Ambulatory Visit: Payer: Self-pay

## 2019-08-06 DIAGNOSIS — Z79899 Other long term (current) drug therapy: Secondary | ICD-10-CM

## 2019-08-07 ENCOUNTER — Telehealth: Payer: Self-pay | Admitting: Rheumatology

## 2019-08-07 LAB — CBC WITH DIFFERENTIAL/PLATELET
Absolute Monocytes: 482 cells/uL (ref 200–950)
Basophils Absolute: 40 cells/uL (ref 0–200)
Basophils Relative: 0.5 %
Eosinophils Absolute: 32 cells/uL (ref 15–500)
Eosinophils Relative: 0.4 %
HCT: 38.9 % (ref 35.0–45.0)
Hemoglobin: 13.2 g/dL (ref 11.7–15.5)
Lymphs Abs: 1209 cells/uL (ref 850–3900)
MCH: 29.1 pg (ref 27.0–33.0)
MCHC: 33.9 g/dL (ref 32.0–36.0)
MCV: 85.9 fL (ref 80.0–100.0)
MPV: 9.6 fL (ref 7.5–12.5)
Monocytes Relative: 6.1 %
Neutro Abs: 6138 cells/uL (ref 1500–7800)
Neutrophils Relative %: 77.7 %
Platelets: 262 10*3/uL (ref 140–400)
RBC: 4.53 10*6/uL (ref 3.80–5.10)
RDW: 12.5 % (ref 11.0–15.0)
Total Lymphocyte: 15.3 %
WBC: 7.9 10*3/uL (ref 3.8–10.8)

## 2019-08-07 LAB — COMPLETE METABOLIC PANEL WITH GFR
AG Ratio: 2.2 (calc) (ref 1.0–2.5)
ALT: 28 U/L (ref 6–29)
AST: 27 U/L (ref 10–35)
Albumin: 4.2 g/dL (ref 3.6–5.1)
Alkaline phosphatase (APISO): 76 U/L (ref 37–153)
BUN: 15 mg/dL (ref 7–25)
CO2: 27 mmol/L (ref 20–32)
Calcium: 9 mg/dL (ref 8.6–10.4)
Chloride: 98 mmol/L (ref 98–110)
Creat: 0.69 mg/dL (ref 0.60–0.93)
GFR, Est African American: 98 mL/min/{1.73_m2} (ref >=60)
GFR, Est Non African American: 85 mL/min/{1.73_m2} (ref >=60)
Globulin: 1.9 g/dL (calc) (ref 1.9–3.7)
Glucose, Bld: 112 mg/dL — ABNORMAL HIGH (ref 65–99)
Potassium: 4 mmol/L (ref 3.5–5.3)
Sodium: 133 mmol/L — ABNORMAL LOW (ref 135–146)
Total Bilirubin: 0.3 mg/dL (ref 0.2–1.2)
Total Protein: 6.1 g/dL (ref 6.1–8.1)

## 2019-08-07 NOTE — Telephone Encounter (Signed)
Patient called stating she was returning your call.   °

## 2019-08-07 NOTE — Telephone Encounter (Signed)
Attempted to contact patient and left message on machine to advise patient to call the office.  

## 2019-08-17 ENCOUNTER — Other Ambulatory Visit: Payer: Self-pay | Admitting: Rheumatology

## 2019-08-17 DIAGNOSIS — M353 Polymyalgia rheumatica: Secondary | ICD-10-CM

## 2019-08-17 NOTE — Telephone Encounter (Signed)
Last Visit: 07/28/2019 Next Visit: 09/10/2019  Okay to refill per Dr. Estanislado Pandy.

## 2019-08-21 ENCOUNTER — Other Ambulatory Visit: Payer: Self-pay | Admitting: Family Medicine

## 2019-08-24 ENCOUNTER — Telehealth: Payer: Self-pay | Admitting: Rheumatology

## 2019-08-24 NOTE — Telephone Encounter (Signed)
I LMOM for patient to call, and schedule a follow up appointment around 09/28/2019.

## 2019-08-24 NOTE — Telephone Encounter (Signed)
-----   Message from Audrie Lia, RT sent at 07/28/2019  3:27 PM EST ----- Regarding: 6-8 WEEK F/U

## 2019-08-24 NOTE — Telephone Encounter (Signed)
Patient called requesting a return call to let her know when she is due for labwork.   

## 2019-08-25 NOTE — Telephone Encounter (Signed)
Spoke with patient and reviewed lab schedule. Patient will call prior to going to the lab (near the end of January 2021) for orders to be released.

## 2019-08-29 ENCOUNTER — Other Ambulatory Visit: Payer: Self-pay | Admitting: Rheumatology

## 2019-09-01 ENCOUNTER — Other Ambulatory Visit: Payer: Self-pay | Admitting: Rheumatology

## 2019-09-01 DIAGNOSIS — M353 Polymyalgia rheumatica: Secondary | ICD-10-CM

## 2019-09-02 NOTE — Telephone Encounter (Addendum)
Last Visit: 07/28/2019 Next Visit: 10/20/2019  Attempted to contact the patient and left message to verify where patient is at in her taper as she is tapering by 1 mg every month. Patient is due to reduce to 6 mg.

## 2019-09-09 ENCOUNTER — Encounter: Payer: Self-pay | Admitting: Rheumatology

## 2019-09-09 ENCOUNTER — Other Ambulatory Visit: Payer: Self-pay | Admitting: Rheumatology

## 2019-09-09 ENCOUNTER — Telehealth: Payer: Self-pay | Admitting: *Deleted

## 2019-09-09 NOTE — Telephone Encounter (Signed)
Attempted to contact the patient and left message for patient to call the office.  

## 2019-09-09 NOTE — Telephone Encounter (Signed)
Patient is asking if you could please prescribe  Dipropionate 0.05% .   I have always had a tube available for small areas of topical break outs that occasionally happen due dermatitis which I have noticed an increase in the last couple of months. Please advise.

## 2019-09-09 NOTE — Telephone Encounter (Signed)
I have not seen patient for the rash.  She may schedule an appointment in the office or a virtual visit for evaluation and prescription.

## 2019-09-09 NOTE — Telephone Encounter (Signed)
Patient states she she does not currently have any places that are visible to be seen. Patient states she was asking to have the medication on hand for when it happens again. Patient advised when the rash returns to call the office and we will schedule an appointment for her to be seen.

## 2019-09-10 ENCOUNTER — Ambulatory Visit: Payer: Medicare Other | Admitting: Rheumatology

## 2019-09-15 ENCOUNTER — Other Ambulatory Visit: Payer: Self-pay | Admitting: Rheumatology

## 2019-09-15 DIAGNOSIS — M353 Polymyalgia rheumatica: Secondary | ICD-10-CM

## 2019-09-15 NOTE — Telephone Encounter (Addendum)
Patient has enough medication. Patient states she has Prednisone 6 mg.

## 2019-09-18 ENCOUNTER — Other Ambulatory Visit: Payer: Self-pay | Admitting: *Deleted

## 2019-09-18 ENCOUNTER — Encounter: Payer: Self-pay | Admitting: Rheumatology

## 2019-09-18 DIAGNOSIS — Z79899 Other long term (current) drug therapy: Secondary | ICD-10-CM

## 2019-09-18 LAB — COMPLETE METABOLIC PANEL WITH GFR
AG Ratio: 2.1 (calc) (ref 1.0–2.5)
ALT: 23 U/L (ref 6–29)
AST: 25 U/L (ref 10–35)
Albumin: 4.2 g/dL (ref 3.6–5.1)
Alkaline phosphatase (APISO): 65 U/L (ref 37–153)
BUN: 12 mg/dL (ref 7–25)
CO2: 28 mmol/L (ref 20–32)
Calcium: 9.3 mg/dL (ref 8.6–10.4)
Chloride: 101 mmol/L (ref 98–110)
Creat: 0.72 mg/dL (ref 0.60–0.93)
GFR, Est African American: 94 mL/min/{1.73_m2} (ref >=60)
GFR, Est Non African American: 81 mL/min/{1.73_m2} (ref >=60)
Globulin: 2 g/dL (calc) (ref 1.9–3.7)
Glucose, Bld: 90 mg/dL (ref 65–99)
Potassium: 4.2 mmol/L (ref 3.5–5.3)
Sodium: 136 mmol/L (ref 135–146)
Total Bilirubin: 0.5 mg/dL (ref 0.2–1.2)
Total Protein: 6.2 g/dL (ref 6.1–8.1)

## 2019-09-18 LAB — CBC WITH DIFFERENTIAL/PLATELET
Absolute Monocytes: 510 cells/uL (ref 200–950)
Basophils Absolute: 31 cells/uL (ref 0–200)
Basophils Relative: 0.6 %
Eosinophils Absolute: 57 cells/uL (ref 15–500)
Eosinophils Relative: 1.1 %
HCT: 41.1 % (ref 35.0–45.0)
Hemoglobin: 13.8 g/dL (ref 11.7–15.5)
Lymphs Abs: 1945 cells/uL (ref 850–3900)
MCH: 28.9 pg (ref 27.0–33.0)
MCHC: 33.6 g/dL (ref 32.0–36.0)
MCV: 86.2 fL (ref 80.0–100.0)
MPV: 9.7 fL (ref 7.5–12.5)
Monocytes Relative: 9.8 %
Neutro Abs: 2657 cells/uL (ref 1500–7800)
Neutrophils Relative %: 51.1 %
Platelets: 241 10*3/uL (ref 140–400)
RBC: 4.77 10*6/uL (ref 3.80–5.10)
RDW: 13.5 % (ref 11.0–15.0)
Total Lymphocyte: 37.4 %
WBC: 5.2 10*3/uL (ref 3.8–10.8)

## 2019-09-21 NOTE — Progress Notes (Signed)
CBC and CMP are within normal limits.

## 2019-10-01 ENCOUNTER — Other Ambulatory Visit: Payer: Self-pay | Admitting: Rheumatology

## 2019-10-01 DIAGNOSIS — M353 Polymyalgia rheumatica: Secondary | ICD-10-CM

## 2019-10-01 NOTE — Telephone Encounter (Signed)
Patient is tapering to 5 mg of Prednisone on 10/06/19. Patient will taper to 4 mg in March. Patient states she has enough medication and does not need a refill at this time.

## 2019-10-01 NOTE — Telephone Encounter (Signed)
Attempted to contact the patient and left message for patient to call the office. Need to verify dose of Prednisone prior to refill .

## 2019-10-11 ENCOUNTER — Ambulatory Visit: Payer: Medicare Other | Attending: Internal Medicine

## 2019-10-11 DIAGNOSIS — Z23 Encounter for immunization: Secondary | ICD-10-CM | POA: Insufficient documentation

## 2019-10-11 NOTE — Progress Notes (Signed)
° °  Covid-19 Vaccination Clinic  Name:  Cheryl Austin    MRN: [id number] DOB: 03-Feb-1941  10/11/2019  [name] was observed post Covid-19 immunization for 15 minutes without incidence. She was provided with Vaccine Information Sheet and instruction to access the V-Safe system.   [name] was instructed to call 911 with any severe reactions post vaccine:  Difficulty breathing   Swelling of your face and throat   A fast heartbeat   A bad rash all over your body   Dizziness and weakness    Immunizations Administered    Name Date Dose VIS Date Route   Pfizer COVID-19 Vaccine 10/11/2019 12:18 PM 0.3 mL 07/31/2019 Intramuscular   Manufacturer: [employer], [employer]   Lot: [id number]   NDC: [address]-8

## 2019-10-12 ENCOUNTER — Telehealth: Payer: Self-pay | Admitting: Family Medicine

## 2019-10-12 NOTE — Telephone Encounter (Signed)
I left a message asking the patient to call and schedule Medicare AWV with Courtney ([facility]).  If patient calls back, please schedule Medicare Wellness Visit (initial) at next available opening.  VDM (Dee-Dee) 

## 2019-10-15 NOTE — Progress Notes (Signed)
Office Visit Note  Patient: Cheryl Austin             Date of Birth: Nov 11, 1940           MRN: [id number]             PCP: Leamon Arnt, MD Referring: Leamon Arnt, MD Visit Date: 10/20/2019 Occupation: @GUAROCC @  Subjective:  Medication monitoring   History of Present Illness: Cheryl Austin is a 79 y.o. female with history of polymyalgia rheumatica and osteoarthritis.  She states she has been on prednisone 5 mg p.o. daily for a month almost now and will be reducing to 4 mg p.o. daily next week.  She has been taking methotrexate 6 tablets p.o. weekly along with folic acid.  She denies any increased muscle weakness or tenderness.  She experiences some lower back pain after prolonged standing.  She denies any radiculopathy.  She denies any discomfort in her hands currently.  Patient states that several years ago she developed dermatitis on her scalp.  For which she was seen by [facility] dermatology.  She was get given a topical steroid cream and she is requesting a refill on it.  She has noticed some lesions on her scalp again.  She was seen at [facility] dermatology in 2019.  On the right record review she was given low-dose methotrexate 2 tablets p.o. weekly along with betamethasone ointment.  She bought an old prescription of betamethasone dipropionate ointment 0.05% 15 g tube.  Activities of Daily Living:  Patient reports morning stiffness for 30-60 minutes.   Patient Denies nocturnal pain.  Difficulty dressing/grooming: Denies Difficulty climbing stairs: Denies Difficulty getting out of chair: Denies Difficulty using hands for taps, buttons, cutlery, and/or writing: Denies  Review of Systems  Constitutional: Negative for fatigue.  HENT: Negative for mouth sores, mouth dryness and nose dryness.   Eyes: Negative for itching and dryness.  Respiratory: Negative for shortness of breath, wheezing and difficulty breathing.   Cardiovascular: Negative for chest pain and palpitations.    Gastrointestinal: Negative for blood in stool, constipation and diarrhea.  Endocrine: Negative for increased urination.  Genitourinary: Negative for difficulty urinating and painful urination.  Musculoskeletal: Positive for arthralgias, joint pain and morning stiffness. Negative for joint swelling.  Skin: Positive for rash.  Allergic/Immunologic: Negative for susceptible to infections.  Neurological: Negative for dizziness, light-headedness, numbness, headaches, memory loss and weakness.  Hematological: Negative for bruising/bleeding tendency.  Psychiatric/Behavioral: Positive for depressed mood. Negative for confusion. The patient is nervous/anxious.     PMFS History:  Patient Active Problem List   Diagnosis Date Noted  . 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 Spondylosis of cervical region without myelopathy or radiculopathy 05/29/2018  . Family history of Parkinson disease 05/21/2016  . Seborrheic dermatitis 11/02/2014  . Osteopenia 07/13/2013  . Moderate episode of recurrent major depressive disorder ([place]) 06/22/2013  . Dermatitis, atopic 01/30/2013  . Menopausal hot flushes 01/30/2013  . MRSA cellulitis 01/30/2013  . 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 Insomnia 03/18/2012  . Hypothyroidism 12/18/2010    Past Medical History:  Diagnosis Date  . Anxiety   . 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 Family history of Parkinson disease 05/21/2016   Mother  . GERD (gastroesophageal reflux disease)   . Kidney failure    blood transfusion  . Moderate episode of recurrent major depressive disorder ([place])   . Osteoporosis   . Restless legs syndrome ([place]) 08/02/2017  .  Thyroid disease    Hypothyroid    Family History  Problem Relation Age of Onset  . Lupus Mother   . Asthma Mother   . Parkinson's disease Mother   . Depression Mother   . Heart disease Father   . Hyperlipidemia Father   . Stroke Father   . Alcohol  abuse Daughter   . Depression Daughter    Past Surgical History:  Procedure Laterality Date  . APPENDECTOMY  1961  . BREAST SURGERY Bilateral 1987 1998   Lumpectomy  . EYE SURGERY    . TOTAL ABDOMINAL HYSTERECTOMY  1986   BSO/Fibroids   Social History   Social History Narrative   Drinks 2-3 caffeine drinks a day    Immunization History  Administered Date(s) Administered  . DT (Pediatric) 05/21/2008  . Influenza Split 06/20/2010, 05/14/2011, 06/18/2012, 06/22/2013, 07/02/2014, 07/05/2015  . Influenza, High Dose Seasonal PF 06/18/2012, 06/18/2012, 06/22/2013, 06/22/2013, 07/02/2014, 07/02/2014, 07/05/2015, 07/05/2015, 05/21/2016, 05/21/2016, 08/02/2017  . Influenza, Seasonal, Injecte, Preservative Fre 05/14/2011  . Influenza,inj,Quad PF,6+ Mos 05/29/2018  . Influenza,trivalent, recombinat, inj, PF 05/14/2011  . Influenza-Unspecified 05/14/2011, 06/18/2012, 06/22/2013, 07/02/2014, 07/05/2015  . PFIZER SARS-COV-2 Vaccination 10/11/2019  . Pneumococcal Conjugate-13 07/04/2014, 07/04/2014  . Pneumococcal Polysaccharide-23 03/20/2005, 06/22/2011  . Pneumococcal-Unspecified 03/20/2005, 06/22/2011  . Td 05/21/2008  . Tdap 06/22/2011, 06/19/2013     Objective: Vital Signs: BP 118/71 (BP Location: Left Arm, Patient Position: Sitting, Cuff Size: Normal)   Pulse 72   Resp 13   Ht 5\' 3"  (1.6 m)   Wt 117 lb (53.1 kg)   BMI 20.73 kg/m    Physical Exam Vitals and nursing note reviewed.  Constitutional:      Appearance: She is well-developed.  HENT:     Head: Normocephalic and atraumatic.  Eyes:     Conjunctiva/sclera: Conjunctivae normal.  Cardiovascular:     Rate and Rhythm: Normal rate and regular rhythm.     Heart sounds: Normal heart sounds.  Pulmonary:     Effort: Pulmonary effort is normal.     Breath sounds: Normal breath sounds.  Abdominal:     General: Bowel sounds are normal.     Palpations: Abdomen is soft.  Musculoskeletal:     Cervical back: Normal range of  motion.  Lymphadenopathy:     Cervical: No cervical adenopathy.  Skin:    General: Skin is warm and dry.     Capillary Refill: Capillary refill takes less than 2 seconds.  Neurological:     Mental Status: She is alert and oriented to person, place, and time.  Psychiatric:        Behavior: Behavior normal.      Musculoskeletal Exam: C-spine thoracic and lumbar spine with good range of motion.  She is some discomfort range of motion of the lumbar spine.  Shoulder joints, elbow joints, wrist joints with good range of motion.  She has some DIP and PIP thickening bilaterally consistent with osteoarthritis with no synovitis.  Hip joints, knee joints, ankles MTPs were in good range of motion with no synovitis.  CDAI Exam: CDAI Score: -- Patient Global: --; Provider Global: -- Swollen: --; Tender: -- Joint Exam 10/20/2019   No joint exam has been documented for this visit   There is currently no information documented on the homunculus. Go to the Rheumatology activity and complete the homunculus joint exam.  Investigation: No additional findings.  Imaging: No results found.  Recent Labs: Lab Results  Component Value Date   WBC 5.2 09/18/2019  HGB 13.8 09/18/2019   PLT 241 09/18/2019   NA 136 09/18/2019   K 4.2 09/18/2019   CL 101 09/18/2019   CO2 28 09/18/2019   GLUCOSE 90 09/18/2019   BUN 12 09/18/2019   CREATININE 0.72 09/18/2019   BILITOT 0.5 09/18/2019   ALKPHOS 91 05/14/2019   AST 25 09/18/2019   ALT 23 09/18/2019   PROT 6.2 09/18/2019   ALBUMIN 4.3 05/14/2019   CALCIUM 9.3 09/18/2019   GFRAA 94 09/18/2019   QFTBGOLDPLUS NEGATIVE 07/06/2019    Speciality Comments: No specialty comments available.  Procedures:  No procedures performed Allergies: Patient has no known allergies.   Assessment / Plan:     Visit Diagnoses: Polymyalgia rheumatica ([facility]) -it is clinically doing well.  She is currently on prednisone 5 mg p.o. daily.  We will continue taper by 1 mg  every month.  A prescription for 1 mg tablets was given to taper prednisone.  Plan: predniSONE (DELTASONE) 1 MG tablet  High risk medication use - Methotrexate 6 tablets by mouth once weekly and folic acid 1 mg po daily.  Her labs were normal in January.  We will check labs again in 3 months and then every 3 months to monitor for drug toxicity.  Primary osteoarthritis of both hands-she has some stiffness in her hands but no synovitis.  Spondylosis of cervical region without myelopathy or radiculopathy-currently not having any discomfort.  Chronic lower back pain without sciatica-I have given her some back exercises.  If she has persistent pain we can evaluate this further.  Seborrheic dermatitis - she was followed by dermatologist.  Patient is requesting a topical steroid cream as she ran out of the prescription.  She has not seen her dermatologist in many years.  Osteopenia of multiple sites - T score was -2.3.  As she will be taking long-term prednisone it would be better for her to start on prophylactic bisphosphonates.  Patient states she will discuss treatment with PCP.  Vitamin D deficiency-she takes supplement.  Other medical problems are listed as follows:  Other insomnia  History of MRSA infection  History of hypothyroidism  History of depression  Family history of systemic lupus erythematosus (SLE) in mother  Family history of Parkinson disease  Orders: No orders of the defined types were placed in this encounter.  Meds ordered this encounter  Medications  . predniSONE (DELTASONE) 1 MG tablet    Sig: Take 4 tablets (4 mg total) by mouth daily with breakfast.    Dispense:  120 tablet    Refill:  0  . betamethasone dipropionate 0.05 % cream    Sig: Apply topically 2 (two) times daily.    Dispense:  30 g    Refill:  0    Face-to-face time spent with patient was 30 minutes. Greater than 50% of time was spent in counseling and coordination of care.  Follow-Up  Instructions: Return in about 3 months (around 01/20/2020) for Polymyalgia rheumatica.   Pollyann Savoy, MD  Note - This record has been created using Animal nutritionist.  Chart creation errors have been sought, but may not always  have been located. Such creation errors do not reflect on  the standard of medical care.

## 2019-10-16 ENCOUNTER — Other Ambulatory Visit: Payer: Self-pay | Admitting: Rheumatology

## 2019-10-16 NOTE — Telephone Encounter (Signed)
Last Visit:07/28/2019 Next Visit:10/20/2019 Labs: 09/18/19 WNL  Okay to refill per Dr. Corliss Skains

## 2019-10-20 ENCOUNTER — Ambulatory Visit: Payer: Medicare Other | Admitting: Rheumatology

## 2019-10-20 ENCOUNTER — Other Ambulatory Visit: Payer: Self-pay

## 2019-10-20 ENCOUNTER — Encounter: Payer: Self-pay | Admitting: Rheumatology

## 2019-10-20 VITALS — BP 118/71 | HR 72 | Resp 13 | Ht 63.0 in | Wt 117.0 lb

## 2019-10-20 DIAGNOSIS — Z8614 Personal history of Methicillin resistant Staphylococcus aureus infection: Secondary | ICD-10-CM

## 2019-10-20 DIAGNOSIS — Z8269 Family history of other diseases of the musculoskeletal system and connective tissue: Secondary | ICD-10-CM

## 2019-10-20 DIAGNOSIS — M19041 Primary osteoarthritis, right hand: Secondary | ICD-10-CM

## 2019-10-20 DIAGNOSIS — M8589 Other specified disorders of bone density and structure, multiple sites: Secondary | ICD-10-CM

## 2019-10-20 DIAGNOSIS — M19042 Primary osteoarthritis, left hand: Secondary | ICD-10-CM

## 2019-10-20 DIAGNOSIS — Z79899 Other long term (current) drug therapy: Secondary | ICD-10-CM

## 2019-10-20 DIAGNOSIS — M353 Polymyalgia rheumatica: Secondary | ICD-10-CM

## 2019-10-20 DIAGNOSIS — M47812 Spondylosis without myelopathy or radiculopathy, cervical region: Secondary | ICD-10-CM | POA: Diagnosis not present

## 2019-10-20 DIAGNOSIS — G8929 Other chronic pain: Secondary | ICD-10-CM

## 2019-10-20 DIAGNOSIS — Z820 Family history of epilepsy and other diseases of the nervous system: Secondary | ICD-10-CM

## 2019-10-20 DIAGNOSIS — L219 Seborrheic dermatitis, unspecified: Secondary | ICD-10-CM

## 2019-10-20 DIAGNOSIS — Z8659 Personal history of other mental and behavioral disorders: Secondary | ICD-10-CM

## 2019-10-20 DIAGNOSIS — E559 Vitamin D deficiency, unspecified: Secondary | ICD-10-CM

## 2019-10-20 DIAGNOSIS — Z8639 Personal history of other endocrine, nutritional and metabolic disease: Secondary | ICD-10-CM

## 2019-10-20 DIAGNOSIS — G4709 Other insomnia: Secondary | ICD-10-CM

## 2019-10-20 DIAGNOSIS — M545 Low back pain: Secondary | ICD-10-CM

## 2019-10-20 MED ORDER — PREDNISONE 1 MG PO TABS: 4.0000 mg | ORAL_TABLET | Freq: Every day | ORAL | 0 refills | Status: DC

## 2019-10-20 MED ORDER — BETAMETHASONE DIPROPIONATE 0.05 % EX CREA: TOPICAL_CREAM | Freq: Two times a day (BID) | CUTANEOUS | 0 refills | Status: DC

## 2019-10-20 NOTE — Patient Instructions (Signed)
Back Exercises The following exercises strengthen the muscles that help to support the trunk and back. They also help to keep the lower back flexible. Doing these exercises can help to prevent back pain or lessen existing pain.  If you have back pain or discomfort, try doing these exercises 2-3 times each day or as told by your health care provider.  As your pain improves, do them once each day, but increase the number of times that you repeat the steps for each exercise (do more repetitions).  To prevent the recurrence of back pain, continue to do these exercises once each day or as told by your health care provider. Do exercises exactly as told by your health care provider and adjust them as directed. It is normal to feel mild stretching, pulling, tightness, or discomfort as you do these exercises, but you should stop right away if you feel sudden pain or your pain gets worse. Exercises Single knee to chest Repeat these steps 3-5 times for each leg: 1. Lie on your back on a firm bed or the floor with your legs extended. 2. Bring one knee to your chest. Your other leg should stay extended and in contact with the floor. 3. Hold your knee in place by grabbing your knee or thigh with both hands and hold. 4. Pull on your knee until you feel a gentle stretch in your lower back or buttocks. 5. Hold the stretch for 10-30 seconds. 6. Slowly release and straighten your leg. Pelvic tilt Repeat these steps 5-10 times: 1. Lie on your back on a firm bed or the floor with your legs extended. 2. Bend your knees so they are pointing toward the ceiling and your feet are flat on the floor. 3. Tighten your lower abdominal muscles to press your lower back against the floor. This motion will tilt your pelvis so your tailbone points up toward the ceiling instead of pointing to your feet or the floor. 4. With gentle tension and even breathing, hold this position for 5-10 seconds. Cat-cow Repeat these steps  until your lower back becomes more flexible: 1. Get into a hands-and-knees position on a firm surface. Keep your hands under your shoulders, and keep your knees under your hips. You may place padding under your knees for comfort. 2. Let your head hang down toward your chest. Contract your abdominal muscles and point your tailbone toward the floor so your lower back becomes rounded like the back of a cat. 3. Hold this position for 5 seconds. 4. Slowly lift your head, let your abdominal muscles relax and point your tailbone up toward the ceiling so your back forms a sagging arch like the back of a cow. 5. Hold this position for 5 seconds.  Press-ups Repeat these steps 5-10 times: 1. Lie on your abdomen (face-down) on the floor. 2. Place your palms near your head, about shoulder-width apart. 3. Keeping your back as relaxed as possible and keeping your hips on the floor, slowly straighten your arms to raise the top half of your body and lift your shoulders. Do not use your back muscles to raise your upper torso. You may adjust the placement of your hands to make yourself more comfortable. 4. Hold this position for 5 seconds while you keep your back relaxed. 5. Slowly return to lying flat on the floor.  Bridges Repeat these steps 10 times: 1. Lie on your back on a firm surface. 2. Bend your knees so they are pointing toward the ceiling and  your feet are flat on the floor. Your arms should be flat at your sides, next to your body. 3. Tighten your buttocks muscles and lift your buttocks off the floor until your waist is at almost the same height as your knees. You should feel the muscles working in your buttocks and the back of your thighs. If you do not feel these muscles, slide your feet 1-2 inches farther away from your buttocks. 4. Hold this position for 3-5 seconds. 5. Slowly lower your hips to the starting position, and allow your buttocks muscles to relax completely. If this exercise is too  easy, try doing it with your arms crossed over your chest. Abdominal crunches Repeat these steps 5-10 times: 1. Lie on your back on a firm bed or the floor with your legs extended. 2. Bend your knees so they are pointing toward the ceiling and your feet are flat on the floor. 3. Cross your arms over your chest. 4. Tip your chin slightly toward your chest without bending your neck. 5. Tighten your abdominal muscles and slowly raise your trunk (torso) high enough to lift your shoulder blades a tiny bit off the floor. Avoid raising your torso higher than that because it can put too much stress on your low back and does not help to strengthen your abdominal muscles. 6. Slowly return to your starting position. Back lifts Repeat these steps 5-10 times: 1. Lie on your abdomen (face-down) with your arms at your sides, and rest your forehead on the floor. 2. Tighten the muscles in your legs and your buttocks. 3. Slowly lift your chest off the floor while you keep your hips pressed to the floor. Keep the back of your head in line with the curve in your back. Your eyes should be looking at the floor. 4. Hold this position for 3-5 seconds. 5. Slowly return to your starting position. Contact a health care provider if:  Your back pain or discomfort gets much worse when you do an exercise.  Your worsening back pain or discomfort does not lessen within 2 hours after you exercise. If you have any of these problems, stop doing these exercises right away. Do not do them again unless your health care provider says that you can. Get help right away if:  You develop sudden, severe back pain. If this happens, stop doing the exercises right away. Do not do them again unless your health care provider says that you can. This information is not intended to replace advice given to you by your health care provider. Make sure you discuss any questions you have with your health care provider. Document Revised: 12/11/2018  Document Reviewed: 05/08/2018 Elsevier Patient Education  2020 [employer].  Standing Labs We placed an order today for your standing lab work.    Please come back and get your standing labs in April and every 3 months We have open lab daily Monday through Thursday from 8:30-12:30 PM and 1:30-4:30 PM and Friday from 8:30-12:30 PM and 1:30-4:00 PM at the office of Dr. Pollyann Savoy.   You may experience shorter wait times on Monday and Friday afternoons. The office is located at [address], [address], [place], [place] [address] No appointment is necessary.   Labs are drawn by [facility].  You may receive a bill from [facility] for your lab work.  If you wish to have your labs drawn at another location, please call the office 24 hours in advance to send orders.  If you have  any questions regarding directions or hours of operation,  please call [phone].   Just as a reminder please drink plenty of water prior to coming for your lab work. Thanks!

## 2019-10-23 ENCOUNTER — Ambulatory Visit (INDEPENDENT_AMBULATORY_CARE_PROVIDER_SITE_OTHER): Payer: Medicare Other | Admitting: Family Medicine

## 2019-10-23 ENCOUNTER — Encounter: Payer: Self-pay | Admitting: Family Medicine

## 2019-10-23 ENCOUNTER — Other Ambulatory Visit: Payer: Self-pay

## 2019-10-23 VITALS — BP 108/66 | HR 67 | Temp 98.1°F | Ht 63.0 in | Wt 116.6 lb

## 2019-10-23 DIAGNOSIS — M48061 Spinal stenosis, lumbar region without neurogenic claudication: Secondary | ICD-10-CM | POA: Diagnosis not present

## 2019-10-23 DIAGNOSIS — T2025XA Burn of second degree of scalp [any part], initial encounter: Secondary | ICD-10-CM | POA: Diagnosis not present

## 2019-10-23 DIAGNOSIS — M353 Polymyalgia rheumatica: Secondary | ICD-10-CM | POA: Diagnosis not present

## 2019-10-23 DIAGNOSIS — M47816 Spondylosis without myelopathy or radiculopathy, lumbar region: Secondary | ICD-10-CM | POA: Diagnosis not present

## 2019-10-23 HISTORY — DX: Polymyalgia rheumatica: M35.3

## 2019-10-23 HISTORY — DX: Spinal stenosis, lumbar region without neurogenic claudication: M48.061

## 2019-10-23 MED ORDER — SILVER SULFADIAZINE 1 % EX CREA: 1.0000 "application " | TOPICAL_CREAM | Freq: Two times a day (BID) | CUTANEOUS | 0 refills | Status: AC

## 2019-10-23 NOTE — Patient Instructions (Signed)
Please return in 6 months for your annual complete physical; please come fasting and follow up mood and thyroid.   You have burned your scalp with the curling iron.  Use the prescribed cream twice a day to help the pain and keep it from getting infected. You may use tylenol if needed for pain. It will take 1-2 weeks to resolve.   If you have any questions or concerns, please don't hesitate to send me a message via MyChart or call the office at [phone]. Thank you for visiting with [place] today! It's our pleasure caring for you.   Burn Care, Adult A burn is an injury to the skin or the tissues under the skin. There are three types of burns:  First degree. These burns may cause the skin to be red and slightly swollen.  Second degree. These burns are very painful and cause the skin to be very red. The skin may also leak fluid, look shiny, and develop blisters.  Third degree. These burns cause permanent damage. They either turn the skin white or black and make it look charred, dry, and leathery. Taking care of your burn properly can help to prevent pain and infection. It can also help the burn to heal more quickly. What are the risks? Complications from burns include:  Damage to the skin.  Reduced blood flow near the injury.  Dead tissue.  Scarring.  Problems with movement, if the burn happened near a joint or on the hands or feet. Severe burns can lead to problems that affect the whole body, such as:  Fluid loss.  Less blood circulating in the body.  Inability to maintain a normal core body temperature (thermoregulation).  Infection.  Shock.  Problems breathing. How to care for a first-degree burn Right after a burn:  Rinse or soak the burn under cool water until the pain stops. Do not put ice on your burn. This can cause more damage.  Lightly cover the burn with a sterile cloth (dressing). Burn care  Follow instructions from your health care provider about: ? How to  clean and take care of the burn. ? When to change and remove the dressing.  Check your burn every day for signs of infection. Check for: ? More redness, swelling, or pain. ? Warmth. ? Pus or a bad smell. Medicine  Take over-the-counter and prescription medicines only as told by your health care provider.  If you were prescribed antibiotic medicine, take or apply it as told by your health care provider. Do not stop using the antibiotic even if your condition improves. General instructions  To prevent infection, do not put butter, oil, or other home remedies on your burn.  Do not rub your burn, even when you are cleaning it.  Protect your burn from the sun. How to care for a second-degree burn Right after a burn:  Rinse or soak the burn under cool water. Do this for several minutes. Do not put ice on your burn. This can cause more damage.  Lightly cover the burn with a sterile cloth (dressing). Burn care  Raise (elevate) the injured area above the level of your heart while sitting or lying down.  Follow instructions from your health care provider about: ? How to clean and take care of the burn. ? When to change and remove the dressing.  Check your burn every day for signs of infection. Check for: ? More redness, swelling, or pain. ? Warmth. ? Pus or a bad smell. Medicine  Take over-the-counter and prescription medicines only as told by your health care provider.  If you were prescribed antibiotic medicine, take or apply it as told by your health care provider. Do not stop using the antibiotic even if your condition improves. General instructions  To prevent infection: ? Do not put butter, oil, or other home remedies on the burn. ? Do not scratch or pick at the burn. ? Do not break any blisters. ? Do not peel skin.  Do not rub your burn, even when you are cleaning it.  Protect your burn from the sun. How to care for a third-degree burn Right after a  burn:  Lightly cover the burn with gauze.  Seek immediate medical attention. Burn care  Raise (elevate) the injured area above the level of your heart while sitting or lying down.  Drink enough fluid to keep your urine clear or pale yellow.  Rest as told by your health care provider. Do not participate in sports or other physical activities until your health care provider approves.  Follow instructions from your health care provider about: ? How to clean and take care of the burn. ? When to change and remove the dressing.  Check your burn every day for signs of infection. Check for: ? More redness, swelling, or pain. ? Warmth. ? Pus or a bad smell. Medicine  Take over-the-counter and prescription medicines only as told by your health care provider.  If you were prescribed antibiotic medicine, take or apply it as told by your health care provider. Do not stop using the antibiotic even if your condition improves. General instructions  To prevent infection: ? Do not put butter, oil, or other home remedies on the burn. ? Do not scratch or pick at the burn. ? Do not break any blisters. ? Do not peel skin. ? Do not rub your burn, even when you are cleaning it.  Protect your burn from the sun.  Keep all follow-up visits as told by your health care provider. This is important. Contact a health care provider if:  Your condition does not improve.  Your condition gets worse.  You have a fever.  Your burn changes in appearance or develops black or red spots.  Your burn feels warm to the touch.  Your pain is not controlled with medicine. Get help right away if:  You have redness, swelling, or pain at the site of the burn.  You have fluid, blood, or pus coming from your burn.  You have red streaks near the burn.  You have severe pain. This information is not intended to replace advice given to you by your health care provider. Make sure you discuss any questions you have  with your health care provider. Document Revised: 07/19/2017 Document Reviewed: 01/24/2016 Elsevier Patient Education  [place].

## 2019-10-23 NOTE — Progress Notes (Signed)
Subjective  CC:  Chief Complaint  Patient presents with  . Head sore    started a week ago. painful to touch. believes it's a boil    HPI: Cheryl Austin is a 79 y.o. female who presents to the office today to address the problems listed above in the chief complaint.  79 yo burned her scalp she thinks last week with curling iron. Felt quick pain but didn't pay any attention to it. Since, has had increasing pain, throbbing (although better this am) and thinks some scabbing but can't see the area. No associated fevers or scalp lesions, itching or rash.   PMR: reviewed rheums notes. On pred and doing much better!   Low back pain due to OA and mild spinal stenosis.    Assessment  1. Partial thickness burn of scalp, initial encounter   2. Spinal stenosis, lumbar region without neurogenic claudication   3. Spondylosis of lumbar region without myelopathy or radiculopathy   4. Polymyalgia rheumatica (HCC)      Plan   2nd degree burn scalp:  Wound care, silvadene cream and monitor for infection. No abscess or infection now.   PMR: improved on pred.   Back pain: supportive care.   Follow up: Return in about 6 months (around 04/24/2020) for complete physical, hypothyroidism follow up.  Visit date not found  No orders of the defined types were placed in this encounter.  Meds ordered this encounter  Medications  . silver sulfADIAZINE (SILVADENE) 1 % cream    Sig: Apply 1 application topically 2 (two) times daily for 7 days.    Dispense:  25 g    Refill:  0      I reviewed the patients updated PMH, FH, and SocHx.    Patient Active Problem List   Diagnosis Date Noted  . Polymyalgia rheumatica (HCC) 10/23/2019    Priority: High  . Moderate episode of recurrent major depressive disorder (HCC) 06/22/2013    Priority: High  . 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 Hypothyroidism 12/18/2010    Priority: High  . Spinal stenosis, lumbar region without neurogenic  claudication 10/23/2019    Priority: Medium  . Spondylosis of cervical region without myelopathy or radiculopathy 05/29/2018    Priority: Medium  . Osteopenia 07/13/2013    Priority: Medium  . Dermatitis, atopic 01/30/2013    Priority: Medium  . 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 Insomnia 03/18/2012    Priority: Medium  . Family history of Parkinson disease 05/21/2016    Priority: Low  . Seborrheic dermatitis 11/02/2014    Priority: Low  . Menopausal hot flushes 01/30/2013    Priority: Low  . Spondylosis of lumbar region without myelopathy or radiculopathy 10/23/2019  . 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 alendronate (FOSAMAX) 70 MG tablet TAKE 1 TABLET ONCE A WEEK. TAKE WITH A FULL GLASS OF WATER ON AN EMPTY STOMACH.  . betamethasone dipropionate 0.05 % cream Apply topically 2 (two) times daily.  . folic acid (FOLVITE) 1 MG tablet Take 1 tablet (1 mg total) by mouth daily.  Marland Kitchen levothyroxine (SYNTHROID, LEVOTHROID) 50 MCG tablet Take 1 tablet (50 mcg total) by mouth daily.  Marland Kitchen LORazepam (ATIVAN) 0.5 MG tablet Take 1-2 tablets by mouth daily as needed.  . methotrexate (RHEUMATREX) 2.5 MG tablet TAKE 6 TABLETS BY MOUTH ONCE WEEKLY. CAUTION:CHEMOTHERAPY. PROTECT FROM LIGHT.  Marland Kitchen predniSONE (DELTASONE) 5 MG tablet  Take 1 tablet (5 mg total) by mouth daily with breakfast.  . sertraline (ZOLOFT) 100 MG tablet Take 1 tablet by mouth daily.  . traZODone (DESYREL) 100 MG tablet Take 100 mg by mouth as needed for sleep.    Allergies: Patient has No Known Allergies. Family History: Patient family history includes Alcohol abuse in her daughter; Asthma in her mother; Depression in her daughter and mother; Heart disease in her father; Hyperlipidemia in her father; Lupus in her mother; Parkinson's disease in her mother; Stroke in her father. Social History:  Patient  reports that she has never smoked. She has never used smokeless  tobacco. She reports current alcohol use. She reports that she does not use drugs.  Review of Systems: Constitutional: Negative for fever malaise or anorexia Cardiovascular: negative for chest pain Respiratory: negative for SOB or persistent cough Gastrointestinal: negative for abdominal pain  Objective  Vitals: BP 108/66 (BP Location: Right Arm, Patient Position: Sitting, Cuff Size: Normal)   Pulse 67   Temp 98.1 F (36.7 C) (Temporal)   Ht 5\' 3"  (1.6 m)   Wt 116 lb 9.6 oz (52.9 kg)   SpO2 98%   BMI 20.65 kg/m  General: no acute distress , A&Ox3 Skin:  Occiput with 1.5cm red mildly swollen scabbed burn w/o fluctuance, mild ttp.      Commons side effects, risks, benefits, and alternatives for medications and treatment plan prescribed today were discussed, and the patient expressed understanding of the given instructions. Patient is instructed to call or message via MyChart if he/she has any questions or concerns regarding our treatment plan. No barriers to understanding were identified. We discussed Red Flag symptoms and signs in detail. Patient expressed understanding regarding what to do in case of urgent or emergency type symptoms.   Medication list was reconciled, printed and provided to the patient in AVS. Patient instructions and summary information was reviewed with the patient as documented in the AVS. This note was prepared with assistance of Dragon voice recognition software. Occasional wrong-word or sound-a-like substitutions may have occurred due to the inherent limitations of voice recognition software  This visit occurred during the SARS-CoV-2 public health emergency.  Safety protocols were in place, including screening questions prior to the visit, additional usage of staff PPE, and extensive cleaning of exam room while observing appropriate contact time as indicated for disinfecting solutions.

## 2019-10-30 ENCOUNTER — Telehealth: Payer: Self-pay | Admitting: Rheumatology

## 2019-10-30 MED ORDER — METHOTREXATE 2.5 MG PO TABS: ORAL_TABLET | ORAL | 0 refills | Status: DC

## 2019-10-30 MED ORDER — METHOTREXATE 2.5 MG PO TABS: 15.0000 mg | ORAL_TABLET | ORAL | 0 refills | Status: DC

## 2019-10-30 NOTE — Telephone Encounter (Signed)
Last Visit: 10/20/19  Next Visit: 01/19/20 Labs: 09/18/19  Okay to refill per Dr. Gildardo Pounds with pharmacist at [facility] and they have given patient a MTX tablet to replace the one she lost in the drain.

## 2019-10-30 NOTE — Telephone Encounter (Signed)
Patient called stating she accidentally dropped one of her Methotrexate tablets down the drain.  Patient states she is due to take her pills tomorrow and only has 5.  Patient is requesting a new prescription be sent to [facility] in [place] with an additional tablet for tomorrow.

## 2019-10-30 NOTE — Addendum Note (Signed)
Addended by: Henriette Combs on: 10/30/2019 05:00 PM   Modules accepted: Orders

## 2019-11-02 ENCOUNTER — Telehealth: Payer: Self-pay | Admitting: Rheumatology

## 2019-11-02 DIAGNOSIS — M353 Polymyalgia rheumatica: Secondary | ICD-10-CM

## 2019-11-02 NOTE — Telephone Encounter (Signed)
Please advise the patient to come to the office today to check her ESR.  She should continue taking MTX and folic acid as prescribed.  Please advise the patient to avoid reducing the dose of prednisone as discussed at her last office visit until the ESR has resulted.    If her back pain is persistent or worsens, please schedule a sooner return visit to obtain x-rays of the lumbar spine.

## 2019-11-02 NOTE — Telephone Encounter (Signed)
Patient advised to come to the office today to check her ESR. Patient states she is unable to come until tomorrow morning.  Patient advised she should continue taking MTX and folic acid as prescribed. Patient advised to avoid reducing the dose of prednisone as discussed at her last office visit until the ESR has resulted.    If her back pain is persistent or worsens, please schedule a sooner return visit to obtain x-rays of the lumbar spine.   Patient is receiving her second Covid vaccination tomorrow and has been advised she should hold the MTX for 1 week after receiving the vaccine.

## 2019-11-02 NOTE — Telephone Encounter (Signed)
Patient would like to discuss some issues that has started to come back. Symptoms are not as bad as it ordinally was, but she had a bad weekend. Please call to advise.

## 2019-11-02 NOTE — Telephone Encounter (Signed)
Patient states she was in office on 10/20/19. Patient states around that time she started having stiffness in lower back and around the shoulders. Patient states she is also having a tired feeling and headaches. Patient states the tops of her hips are hurting. Patient states it is not as bad as it was before. Patient states it starts to get better by the middle of the day. Patient she is currently MTX 6 weekly and denies missing any doses. Patient is also on Prednisone 5 mg daily. Please advise.

## 2019-11-03 ENCOUNTER — Other Ambulatory Visit: Payer: Self-pay

## 2019-11-03 DIAGNOSIS — M353 Polymyalgia rheumatica: Secondary | ICD-10-CM

## 2019-11-03 DIAGNOSIS — Z79899 Other long term (current) drug therapy: Secondary | ICD-10-CM

## 2019-11-04 ENCOUNTER — Ambulatory Visit: Payer: Medicare Other | Attending: Internal Medicine

## 2019-11-04 DIAGNOSIS — Z23 Encounter for immunization: Secondary | ICD-10-CM

## 2019-11-04 LAB — COMPLETE METABOLIC PANEL WITH GFR
AG Ratio: 2.2 (calc) (ref 1.0–2.5)
ALT: 27 U/L (ref 6–29)
AST: 31 U/L (ref 10–35)
Albumin: 3.9 g/dL (ref 3.6–5.1)
Alkaline phosphatase (APISO): 67 U/L (ref 37–153)
BUN: 12 mg/dL (ref 7–25)
CO2: 26 mmol/L (ref 20–32)
Calcium: 8.9 mg/dL (ref 8.6–10.4)
Chloride: 103 mmol/L (ref 98–110)
Creat: 0.67 mg/dL (ref 0.60–0.93)
GFR, Est African American: 98 mL/min/{1.73_m2} (ref >=60)
GFR, Est Non African American: 85 mL/min/{1.73_m2} (ref >=60)
Globulin: 1.8 g/dL (calc) — ABNORMAL LOW (ref 1.9–3.7)
Glucose, Bld: 90 mg/dL (ref 65–99)
Potassium: 4 mmol/L (ref 3.5–5.3)
Sodium: 138 mmol/L (ref 135–146)
Total Bilirubin: 0.4 mg/dL (ref 0.2–1.2)
Total Protein: 5.7 g/dL — ABNORMAL LOW (ref 6.1–8.1)

## 2019-11-04 LAB — CBC WITH DIFFERENTIAL/PLATELET
Absolute Monocytes: 573 cells/uL (ref 200–950)
Basophils Absolute: 29 cells/uL (ref 0–200)
Basophils Relative: 0.6 %
Eosinophils Absolute: 201 cells/uL (ref 15–500)
Eosinophils Relative: 4.1 %
HCT: 38.6 % (ref 35.0–45.0)
Hemoglobin: 13 g/dL (ref 11.7–15.5)
Lymphs Abs: 1313 cells/uL (ref 850–3900)
MCH: 29.7 pg (ref 27.0–33.0)
MCHC: 33.7 g/dL (ref 32.0–36.0)
MCV: 88.1 fL (ref 80.0–100.0)
MPV: 9.3 fL (ref 7.5–12.5)
Monocytes Relative: 11.7 %
Neutro Abs: 2783 cells/uL (ref 1500–7800)
Neutrophils Relative %: 56.8 %
Platelets: 230 10*3/uL (ref 140–400)
RBC: 4.38 10*6/uL (ref 3.80–5.10)
RDW: 13.2 % (ref 11.0–15.0)
Total Lymphocyte: 26.8 %
WBC: 4.9 10*3/uL (ref 3.8–10.8)

## 2019-11-04 LAB — SEDIMENTATION RATE: Sed Rate: 2 mm/h (ref 0–30)

## 2019-11-04 NOTE — Progress Notes (Signed)
   Covid-19 Vaccination Clinic  Name:  Cheryl Austin    MRN: [id number] DOB: 30-Nov-1940  11/04/2019  Cheryl Austin was observed post Covid-19 immunization for 15 minutes without incident. She was provided with Vaccine Information Sheet and instruction to access the V-Safe system.   Cheryl Austin was instructed to call 911 with any severe reactions post vaccine: Marland Kitchen Difficulty breathing  . Swelling of face and throat  . A fast heartbeat  . A bad rash all over body  . Dizziness and weakness   Immunizations Administered    Name Date Dose VIS Date Route   Pfizer COVID-19 Vaccine 11/04/2019 10:24 AM 0.3 mL 07/31/2019 Intramuscular   Manufacturer: [employer], [employer]   Lot: [id number]   NDC: [address]-0

## 2019-11-04 NOTE — Progress Notes (Signed)
Protein is low due to chronic disease.  Rest the labs are stable.

## 2019-11-04 NOTE — Progress Notes (Signed)
ESR is WNL. It is unlikely that she is having a PMR flare.  If the patients symptoms persist or worsen please advise patient to schedule a follow up visit for further evaluation.

## 2019-11-09 NOTE — Progress Notes (Signed)
Office Visit Note  Patient: Cheryl Austin             Date of Birth: 1941/04/15           MRN: [id number]             PCP: [name], MD Referring: [name], MD Visit Date: 11/10/2019 Occupation: @GUAROCC @  Subjective:  Medication monitoring.   History of Present Illness: Cheryl Austin is a 79 y.o. female with history of polymyalgia rheumatica, osteoarthritis and degenerative disc disease.  She states she has no muscle weakness or tenderness.  She is able to get up from the chair without discomfort.  She has been lowering her prednisone and has been on prednisone 5 mg p.o. daily for the last month.  She has been taking methotrexate 6 tablets/week.  She states her initial problem of dizziness and fatigue persist.  She also has been experiencing lower back pain especially in the morning.  She is concerned that her lower back pain persists despite going to the chiropractor.  Activities of Daily Living:  Patient reports morning stiffness for several hours.   Patient Denies nocturnal pain.  Difficulty dressing/grooming: Denies Difficulty climbing stairs: Denies Difficulty getting out of chair: Denies Difficulty using hands for taps, buttons, cutlery, and/or writing: Denies  Review of Systems  Constitutional: Positive for fatigue.  HENT: Negative for mouth sores, mouth dryness and nose dryness.   Eyes: Negative for itching and dryness.  Respiratory: Negative for shortness of breath and difficulty breathing.   Cardiovascular: Negative for chest pain and palpitations.  Gastrointestinal: Negative for blood in stool, constipation and diarrhea.  Endocrine: Negative for increased urination.  Genitourinary: Negative for difficulty urinating.  Musculoskeletal: Positive for arthralgias, joint pain and morning stiffness. Negative for joint swelling.  Skin: Negative for rash, hair loss and redness.  Allergic/Immunologic: Negative for susceptible to infections.  Neurological:  Positive for dizziness. Negative for numbness, headaches, memory loss and weakness.  Hematological: Negative for bruising/bleeding tendency.  Psychiatric/Behavioral: Positive for depressed mood. Negative for confusion.    PMFS History:  Patient Active Problem List   Diagnosis Date Noted  . Polymyalgia rheumatica (HCC) 10/23/2019  . Spinal stenosis, lumbar region without neurogenic claudication 10/23/2019  . Spondylosis of lumbar region without myelopathy or radiculopathy 10/23/2019  . 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 Spondylosis of cervical region without myelopathy or radiculopathy 05/29/2018  . Family history of Parkinson disease 05/21/2016  . Seborrheic dermatitis 11/02/2014  . Osteopenia 07/13/2013  . Moderate episode of recurrent major depressive disorder (HCC) 06/22/2013  . Dermatitis, atopic 01/30/2013  . Menopausal hot flushes 01/30/2013  . MRSA cellulitis 01/30/2013  . 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 Insomnia 03/18/2012  . Hypothyroidism 12/18/2010    Past Medical History:  Diagnosis Date  . Anxiety   . 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 Family history of Parkinson disease 05/21/2016   Mother  . GERD (gastroesophageal reflux disease)   . Kidney failure    blood transfusion  . Moderate episode of recurrent major depressive disorder (HCC)   . Osteoporosis   . Polymyalgia rheumatica (HCC) 10/23/2019   Aug 2020, Dr. 10-18-1977  . Restless legs syndrome (RLS) 08/02/2017  . 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 Thyroid disease    Hypothyroid    Family History  Problem Relation Age of Onset  . Lupus Mother   .  Asthma Mother   . Parkinson's disease Mother   . Depression Mother   . Heart disease Father   . Hyperlipidemia Father   . Stroke Father   . Alcohol abuse Daughter   . Depression Daughter    Past Surgical History:    Procedure Laterality Date  . APPENDECTOMY  1961  . BREAST SURGERY Bilateral 1987 1998   Lumpectomy  . EYE SURGERY    . TOTAL ABDOMINAL HYSTERECTOMY  1986   BSO/Fibroids   Social History   Social History Narrative   Drinks 2-3 caffeine drinks a day    Immunization History  Administered Date(s) Administered  . DT (Pediatric) 05/21/2008  . Influenza Split 06/20/2010, 05/14/2011, 06/18/2012, 06/22/2013, 07/02/2014, 07/05/2015  . Influenza, High Dose Seasonal PF 06/18/2012, 06/18/2012, 06/22/2013, 06/22/2013, 07/02/2014, 07/02/2014, 07/05/2015, 07/05/2015, 05/21/2016, 05/21/2016, 08/02/2017  . Influenza, Seasonal, Injecte, Preservative Fre 05/14/2011  . Influenza,inj,Quad PF,6+ Mos 05/29/2018  . Influenza,trivalent, recombinat, inj, PF 05/14/2011  . Influenza-Unspecified 05/14/2011, 06/18/2012, 06/22/2013, 07/02/2014, 07/05/2015  . PFIZER SARS-COV-2 Vaccination 10/11/2019, 11/04/2019  . Pneumococcal Conjugate-13 07/04/2014, 07/04/2014  . Pneumococcal Polysaccharide-23 03/20/2005, 06/22/2011  . Pneumococcal-Unspecified 03/20/2005, 06/22/2011  . Td 05/21/2008  . Tdap 06/22/2011, 06/19/2013     Objective: Vital Signs: BP 123/71 (BP Location: Left Arm, Patient Position: Sitting, Cuff Size: Normal)   Pulse 70   Resp 12   Ht 5\' 3"  (1.6 m)   Wt 119 lb 3.2 oz (54.1 kg)   BMI 21.12 kg/m    Physical Exam Vitals and nursing note reviewed.  Constitutional:      Appearance: She is well-developed.  HENT:     Head: Normocephalic and atraumatic.     Comments: She had no temporal artery tenderness. Eyes:     Conjunctiva/sclera: Conjunctivae normal.  Cardiovascular:     Rate and Rhythm: Normal rate and regular rhythm.     Heart sounds: Normal heart sounds.  Pulmonary:     Effort: Pulmonary effort is normal.     Breath sounds: Normal breath sounds.  Abdominal:     General: Bowel sounds are normal.     Palpations: Abdomen is soft.  Musculoskeletal:     Cervical back: Normal range  of motion.     Comments: Patient had no muscular weakness or tenderness.  Lymphadenopathy:     Cervical: No cervical adenopathy.  Skin:    General: Skin is warm and dry.     Capillary Refill: Capillary refill takes less than 2 seconds.  Neurological:     Mental Status: She is alert and oriented to person, place, and time.  Psychiatric:        Behavior: Behavior normal.      Musculoskeletal Exam: She has limited range of motion of cervical and lumbar spine with some discomfort.  Shoulder joints, elbow joints, wrist joints with good range of motion.  She has no synovitis in her hands.  Hip joints, knee joints, ankles with good range of motion.  CDAI Exam: CDAI Score: -- Patient Global: --; Provider Global: -- Swollen: --; Tender: -- Joint Exam 11/10/2019   No joint exam has been documented for this visit   There is currently no information documented on the homunculus. Go to the Rheumatology activity and complete the homunculus joint exam.  Investigation: No additional findings.  Imaging: XR Lumbar Spine 2-3 Views  Result Date: 11/10/2019 L5-S1 narrowing was noted.  Mild anterior spurring was noted.  Moderate facet joint arthropathy was noted at several levels. Impression: These findings are consistent with mild  spondylosis and moderate facet joint arthropathy.   Recent Labs: Lab Results  Component Value Date   WBC 4.9 11/03/2019   HGB 13.0 11/03/2019   PLT 230 11/03/2019   NA 138 11/03/2019   K 4.0 11/03/2019   CL 103 11/03/2019   CO2 26 11/03/2019   GLUCOSE 90 11/03/2019   BUN 12 11/03/2019   CREATININE 0.67 11/03/2019   BILITOT 0.4 11/03/2019   ALKPHOS 91 05/14/2019   AST 31 11/03/2019   ALT 27 11/03/2019   PROT 5.7 (L) 11/03/2019   ALBUMIN 4.3 05/14/2019   CALCIUM 8.9 11/03/2019   GFRAA 98 11/03/2019   QFTBGOLDPLUS NEGATIVE 07/06/2019    Speciality Comments: No specialty comments available.  Procedures:  No procedures performed Allergies: Patient has  no known allergies.   Assessment / Plan:     Visit Diagnoses: Polymyalgia rheumatica ([place]) - prednisone taper by 1 mg every month.  She is currently on prednisone 5 mg p.o. daily.  She is on methotrexate 6 tablets p.o. weekly.  Her most recent sed rate was 2.  She had no muscular weakness or tenderness on examination today.  She will continue to taper prednisone by 1 mg every month.  High risk medication use - Methotrexate 6 tablets by mouth once weekly and folic acid 1 mg po daily.  Her labs in March were stable.  We will continue to  monitor labs every 3 months.  Primary osteoarthritis of both hands-she has osteoarthritis in her hands which is not causing much discomfort.  Spondylosis of cervical region without myelopathy or radiculopathy-chronic pain and limited range of motion.  Spondylosis of lumbar region without myelopathy or radiculopathy -she has been experiencing increased pain and discomfort in the lumbar region.  Plan: XR Lumbar Spine 2-3 Views.  The x-rays today showed facet joint arthropathy.  Patient states she has been going to the chiropractor which has been helpful.  Have given her a handout on back exercises.  I also offered referral to a back specialist but she declined.  Dizziness-patient complains of dizziness and fatigue for last several months.  She states these this has been going on prior to starting medications.  I recommended neurology referral.  Patient will discuss with her PCP.  Seborrheic dermatitis - She has not seen her dermatologist in many years.  Osteopenia of multiple sites - T score was -2.3.  As she will be taking long-term prednisone it would be better for her to start on prophylactic bisphosphonates.    Vitamin D deficiency  Other insomnia  History of hypothyroidism  History of depression  History of MRSA infection  Family history of Parkinson disease  Family history of systemic lupus erythematosus (SLE) in mother  Orders: Orders Placed This  Encounter  Procedures  . XR Lumbar Spine 2-3 Views   No orders of the defined types were placed in this encounter.   .  Follow-Up Instructions: Return in about 3 months (around 02/10/2020) for PMR,OA,DDD.   Bo Merino, MD  Note - This record has been created using Editor, commissioning.  Chart creation errors have been sought, but may not always  have been located. Such creation errors do not reflect on  the standard of medical care.

## 2019-11-10 ENCOUNTER — Ambulatory Visit: Payer: Medicare Other | Admitting: Rheumatology

## 2019-11-10 ENCOUNTER — Other Ambulatory Visit: Payer: Self-pay

## 2019-11-10 ENCOUNTER — Encounter: Payer: Self-pay | Admitting: Rheumatology

## 2019-11-10 ENCOUNTER — Ambulatory Visit: Payer: Self-pay

## 2019-11-10 VITALS — BP 123/71 | HR 70 | Resp 12 | Ht 63.0 in | Wt 119.2 lb

## 2019-11-10 DIAGNOSIS — M353 Polymyalgia rheumatica: Secondary | ICD-10-CM | POA: Diagnosis not present

## 2019-11-10 DIAGNOSIS — M533 Sacrococcygeal disorders, not elsewhere classified: Secondary | ICD-10-CM | POA: Diagnosis not present

## 2019-11-10 DIAGNOSIS — M47816 Spondylosis without myelopathy or radiculopathy, lumbar region: Secondary | ICD-10-CM

## 2019-11-10 DIAGNOSIS — G8929 Other chronic pain: Secondary | ICD-10-CM | POA: Diagnosis not present

## 2019-11-10 DIAGNOSIS — M8589 Other specified disorders of bone density and structure, multiple sites: Secondary | ICD-10-CM

## 2019-11-10 DIAGNOSIS — M79672 Pain in left foot: Secondary | ICD-10-CM

## 2019-11-10 DIAGNOSIS — Z8659 Personal history of other mental and behavioral disorders: Secondary | ICD-10-CM

## 2019-11-10 DIAGNOSIS — M19041 Primary osteoarthritis, right hand: Secondary | ICD-10-CM | POA: Diagnosis not present

## 2019-11-10 DIAGNOSIS — Z8639 Personal history of other endocrine, nutritional and metabolic disease: Secondary | ICD-10-CM

## 2019-11-10 DIAGNOSIS — M19042 Primary osteoarthritis, left hand: Secondary | ICD-10-CM

## 2019-11-10 DIAGNOSIS — Z79899 Other long term (current) drug therapy: Secondary | ICD-10-CM | POA: Diagnosis not present

## 2019-11-10 DIAGNOSIS — M47812 Spondylosis without myelopathy or radiculopathy, cervical region: Secondary | ICD-10-CM | POA: Diagnosis not present

## 2019-11-10 DIAGNOSIS — G4709 Other insomnia: Secondary | ICD-10-CM

## 2019-11-10 DIAGNOSIS — M79671 Pain in right foot: Secondary | ICD-10-CM

## 2019-11-10 DIAGNOSIS — Z8614 Personal history of Methicillin resistant Staphylococcus aureus infection: Secondary | ICD-10-CM

## 2019-11-10 DIAGNOSIS — L219 Seborrheic dermatitis, unspecified: Secondary | ICD-10-CM

## 2019-11-10 DIAGNOSIS — Z8269 Family history of other diseases of the musculoskeletal system and connective tissue: Secondary | ICD-10-CM

## 2019-11-10 DIAGNOSIS — Z820 Family history of epilepsy and other diseases of the nervous system: Secondary | ICD-10-CM

## 2019-11-10 DIAGNOSIS — E559 Vitamin D deficiency, unspecified: Secondary | ICD-10-CM

## 2019-11-10 DIAGNOSIS — R42 Dizziness and giddiness: Secondary | ICD-10-CM

## 2019-11-10 NOTE — Patient Instructions (Addendum)
Back Exercises The following exercises strengthen the muscles that help to support the trunk and back. They also help to keep the lower back flexible. Doing these exercises can help to prevent back pain or lessen existing pain.  If you have back pain or discomfort, try doing these exercises 2-3 times each day or as told by your health care provider.  As your pain improves, do them once each day, but increase the number of times that you repeat the steps for each exercise (do more repetitions).  To prevent the recurrence of back pain, continue to do these exercises once each day or as told by your health care provider. Do exercises exactly as told by your health care provider and adjust them as directed. It is normal to feel mild stretching, pulling, tightness, or discomfort as you do these exercises, but you should stop right away if you feel sudden pain or your pain gets worse. Exercises Single knee to chest Repeat these steps 3-5 times for each leg: 1. Lie on your back on a firm bed or the floor with your legs extended. 2. Bring one knee to your chest. Your other leg should stay extended and in contact with the floor. 3. Hold your knee in place by grabbing your knee or thigh with both hands and hold. 4. Pull on your knee until you feel a gentle stretch in your lower back or buttocks. 5. Hold the stretch for 10-30 seconds. 6. Slowly release and straighten your leg. Pelvic tilt Repeat these steps 5-10 times: 1. Lie on your back on a firm bed or the floor with your legs extended. 2. Bend your knees so they are pointing toward the ceiling and your feet are flat on the floor. 3. Tighten your lower abdominal muscles to press your lower back against the floor. This motion will tilt your pelvis so your tailbone points up toward the ceiling instead of pointing to your feet or the floor. 4. With gentle tension and even breathing, hold this position for 5-10 seconds. Cat-cow Repeat these steps until  your lower back becomes more flexible: 1. Get into a hands-and-knees position on a firm surface. Keep your hands under your shoulders, and keep your knees under your hips. You may place padding under your knees for comfort. 2. Let your head hang down toward your chest. Contract your abdominal muscles and point your tailbone toward the floor so your lower back becomes rounded like the back of a cat. 3. Hold this position for 5 seconds. 4. Slowly lift your head, let your abdominal muscles relax and point your tailbone up toward the ceiling so your back forms a sagging arch like the back of a cow. 5. Hold this position for 5 seconds.  Press-ups Repeat these steps 5-10 times: 1. Lie on your abdomen (face-down) on the floor. 2. Place your palms near your head, about shoulder-width apart. 3. Keeping your back as relaxed as possible and keeping your hips on the floor, slowly straighten your arms to raise the top half of your body and lift your shoulders. Do not use your back muscles to raise your upper torso. You may adjust the placement of your hands to make yourself more comfortable. 4. Hold this position for 5 seconds while you keep your back relaxed. 5. Slowly return to lying flat on the floor.  Bridges Repeat these steps 10 times: 1. Lie on your back on a firm surface. 2. Bend your knees so they are pointing toward the ceiling and   your feet are flat on the floor. Your arms should be flat at your sides, next to your body. 3. Tighten your buttocks muscles and lift your buttocks off the floor until your waist is at almost the same height as your knees. You should feel the muscles working in your buttocks and the back of your thighs. If you do not feel these muscles, slide your feet 1-2 inches farther away from your buttocks. 4. Hold this position for 3-5 seconds. 5. Slowly lower your hips to the starting position, and allow your buttocks muscles to relax completely. If this exercise is too easy, try  doing it with your arms crossed over your chest. Abdominal crunches Repeat these steps 5-10 times: 1. Lie on your back on a firm bed or the floor with your legs extended. 2. Bend your knees so they are pointing toward the ceiling and your feet are flat on the floor. 3. Cross your arms over your chest. 4. Tip your chin slightly toward your chest without bending your neck. 5. Tighten your abdominal muscles and slowly raise your trunk (torso) high enough to lift your shoulder blades a tiny bit off the floor. Avoid raising your torso higher than that because it can put too much stress on your low back and does not help to strengthen your abdominal muscles. 6. Slowly return to your starting position. Back lifts Repeat these steps 5-10 times: 1. Lie on your abdomen (face-down) with your arms at your sides, and rest your forehead on the floor. 2. Tighten the muscles in your legs and your buttocks. 3. Slowly lift your chest off the floor while you keep your hips pressed to the floor. Keep the back of your head in line with the curve in your back. Your eyes should be looking at the floor. 4. Hold this position for 3-5 seconds. 5. Slowly return to your starting position. Contact a health care provider if:  Your back pain or discomfort gets much worse when you do an exercise.  Your worsening back pain or discomfort does not lessen within 2 hours after you exercise. If you have any of these problems, stop doing these exercises right away. Do not do them again unless your health care provider says that you can. Get help right away if:  You develop sudden, severe back pain. If this happens, stop doing the exercises right away. Do not do them again unless your health care provider says that you can. This information is not intended to replace advice given to you by your health care provider. Make sure you discuss any questions you have with your health care provider. Document Revised: 12/11/2018 Document  Reviewed: 05/08/2018 Elsevier Patient Education  2020 [employer]. Standing Labs We placed an order today for your standing lab work.    Please come back and get your standing labs in June and every 3 months  We have open lab daily Monday through Thursday from 8:30-12:30 PM and 1:30-4:30 PM and Friday from 8:30-12:30 PM and 1:30-4:00 PM at the office of Dr. Pollyann Savoy.   You may experience shorter wait times on Monday and Friday afternoons. The office is located at [address], [address], [place], [place] [address] No appointment is necessary.   Labs are drawn by [facility].  You may receive a bill from [facility] for your lab work.  If you wish to have your labs drawn at another location, please call the office 24 hours in advance to send orders.  If you have  any questions regarding directions or hours of operation,  please call [phone].   Just as a reminder please drink plenty of water prior to coming for your lab work. Thanks!

## 2019-11-15 ENCOUNTER — Other Ambulatory Visit: Payer: Self-pay | Admitting: Rheumatology

## 2019-11-15 DIAGNOSIS — M353 Polymyalgia rheumatica: Secondary | ICD-10-CM

## 2019-11-16 ENCOUNTER — Other Ambulatory Visit: Payer: Self-pay | Admitting: Family Medicine

## 2019-11-16 NOTE — Telephone Encounter (Addendum)
Last Visit: 11/10/19 Next Visit: 01/19/20  Attempted to contact the patient and left message for patient to call the office. Need to verify prednisone dose.

## 2019-11-17 NOTE — Telephone Encounter (Signed)
Patient returned call the office. Patient states she is currently on Prednisone 4 mg and due to taper to 3 mg around 12/09/19. Patient states she will contact the office when she needs refill of Prednisone as she has enough at this time.

## 2019-11-17 NOTE — Telephone Encounter (Signed)
Attempted to contact the patient and left message for patient to call the office.  

## 2019-11-30 ENCOUNTER — Other Ambulatory Visit: Payer: Self-pay

## 2019-11-30 ENCOUNTER — Telehealth (INDEPENDENT_AMBULATORY_CARE_PROVIDER_SITE_OTHER): Payer: Medicare Other | Admitting: Family Medicine

## 2019-11-30 DIAGNOSIS — G8929 Other chronic pain: Secondary | ICD-10-CM | POA: Diagnosis not present

## 2019-11-30 DIAGNOSIS — M545 Low back pain, unspecified: Secondary | ICD-10-CM

## 2019-11-30 DIAGNOSIS — M47816 Spondylosis without myelopathy or radiculopathy, lumbar region: Secondary | ICD-10-CM | POA: Diagnosis not present

## 2019-11-30 DIAGNOSIS — M8580 Other specified disorders of bone density and structure, unspecified site: Secondary | ICD-10-CM

## 2019-12-01 NOTE — Progress Notes (Signed)
Virtual Visit via Video Note  Subjective  CC:  Chief Complaint  Patient presents with  . Back Pain    lower back pain, worsening over time, requesting ortho referral     I connected with Cheryl Austin on 12/01/19 at  4:30 PM EDT by a video enabled telemedicine application and verified that I am speaking with the correct person using two identifiers. Location patient: Home Location provider: [facility], Office Persons participating in the virtual visit: Cheryl Austin, Leamon Arnt, MD Serita Sheller, [place] discussed the limitations of evaluation and management by telemedicine and the availability of in person appointments. The patient expressed understanding and agreed to proceed. HPI: Cheryl Austin is a 79 y.o. female who was contacted today to address the problems listed above in the chief complaint. . 2 years of low back pain continues to worsen. PMR and those sxs have improved on pred. Had recent lumbar xrays and an MRI last fall. + djd changes, facet arthropathy. Pain can be moderate to severe w/o radiation or weakness or b/b/dysfunction. Typically doesn't take anything for it. Now ready to see specialist.  . Of note, started on fosamax due to osteopenia on recent dexa and current chronic pred use.   Assessment  1. Facet arthropathy, lumbar   2. Chronic midline low back pain without sciatica   3. Spondylosis of lumbar region without myelopathy or radiculopathy   4. Osteopenia, unspecified location      Plan   Back pain:  Refer to back orthopedist for further mgt.   Fosamax.  I discussed the assessment and treatment plan with the patient. The patient was provided an opportunity to ask questions and all were answered. The patient agreed with the plan and demonstrated an understanding of the instructions.   The patient was advised to call back or seek an in-person evaluation if the symptoms worsen or if the condition fails to improve as  anticipated. Follow up: No follow-ups on file.  04/27/2020  No orders of the defined types were placed in this encounter.     I reviewed the patients updated PMH, FH, and SocHx.    Patient Active Problem List   Diagnosis Date Noted  . Polymyalgia rheumatica ([place]) 10/23/2019    Priority: High  . Moderate episode of recurrent major depressive disorder ([place]) 06/22/2013    Priority: High  . 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 Hypothyroidism 12/18/2010    Priority: High  . Spinal stenosis, lumbar region without neurogenic claudication 10/23/2019    Priority: Medium  . Spondylosis of cervical region without myelopathy or radiculopathy 05/29/2018    Priority: Medium  . Osteopenia 07/13/2013    Priority: Medium  . Dermatitis, atopic 01/30/2013    Priority: Medium  . 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 Insomnia 03/18/2012    Priority: Medium  . Family history of Parkinson disease 05/21/2016    Priority: Low  . Seborrheic dermatitis 11/02/2014    Priority: Low  . Menopausal hot flushes 01/30/2013    Priority: Low  . Spondylosis of lumbar region without myelopathy or radiculopathy 10/23/2019  . 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 alendronate (FOSAMAX) 70 MG tablet TAKE 1 TABLET ONCE A WEEK. TAKE WITH A FULL GLASS OF WATER ON AN EMPTY STOMACH.  . betamethasone dipropionate 0.05 % cream Apply topically 2 (two) times daily.  Marland Kitchen  folic acid (FOLVITE) 1 MG tablet Take 1 tablet (1 mg total) by mouth daily.  Marland Kitchen levothyroxine (SYNTHROID) 50 MCG tablet TAKE 1 TABLET BY MOUTH EVERY DAY  . LORazepam (ATIVAN) 0.5 MG tablet Take 1-2 tablets by mouth daily as needed.  . methotrexate (RHEUMATREX) 2.5 MG tablet Take 6 tablets (15 mg total) by mouth once a week. Caution:Chemotherapy. Protect from light.  . predniSONE (DELTASONE) 5 MG tablet Take 1 tablet (5 mg total) by mouth daily with breakfast.  . sertraline (ZOLOFT) 100  MG tablet Take 1 tablet by mouth daily.  . traZODone (DESYREL) 100 MG tablet Take 100 mg by mouth as needed for sleep.    Allergies: Patient has No Known Allergies. Family History: Patient family history includes Alcohol abuse in her daughter; Asthma in her mother; Depression in her daughter and mother; Heart disease in her father; Hyperlipidemia in her father; Lupus in her mother; Parkinson's disease in her mother; Stroke in her father. Social History:  Patient  reports that she has never smoked. She has never used smokeless tobacco. She reports current alcohol use. She reports that she does not use drugs.  Review of Systems: Constitutional: Negative for fever malaise or anorexia Cardiovascular: negative for chest pain Respiratory: negative for SOB or persistent cough Gastrointestinal: negative for abdominal pain  OBJECTIVE Vitals: There were no vitals taken for this visit. General: no acute distress , A&Ox3  [name], MD

## 2019-12-09 ENCOUNTER — Telehealth: Payer: Self-pay | Admitting: Rheumatology

## 2019-12-09 MED ORDER — PREDNISONE 1 MG PO TABS: 3.0000 mg | ORAL_TABLET | Freq: Every day | ORAL | 0 refills | Status: DC

## 2019-12-09 NOTE — Telephone Encounter (Signed)
Patient called requesting prescription refill of Predisone 1 mg tablet to be sent to [facility] in [place].  Patient states she will be decreasing her medication from 4 mg to 3 mg next week.

## 2019-12-09 NOTE — Telephone Encounter (Signed)
Last Visit: 11/10/2019 Next Visit: 01/19/2020  Patient is tapering prednisone by 1mg  every month.   Okay to refill per Dr. .

## 2019-12-11 ENCOUNTER — Ambulatory Visit: Payer: Medicare Other | Admitting: Orthopaedic Surgery

## 2019-12-11 ENCOUNTER — Encounter: Payer: Self-pay | Admitting: Orthopaedic Surgery

## 2019-12-11 ENCOUNTER — Other Ambulatory Visit: Payer: Self-pay

## 2019-12-11 VITALS — BP 117/80 | HR 80 | Ht 63.0 in | Wt 115.0 lb

## 2019-12-11 DIAGNOSIS — M47816 Spondylosis without myelopathy or radiculopathy, lumbar region: Secondary | ICD-10-CM

## 2019-12-11 NOTE — Progress Notes (Signed)
Office Visit Note   Patient: Cheryl Austin           Date of Birth: 07-03-1941           MRN: [id number] Visit Date: 12/11/2019              Requested by: [name], MD [address] [place],  [place] [address] PCP: [name], MD   Assessment & Plan: Visit Diagnoses:  1. Lumbar facet arthropathy     Plan: We reviewed her lumbar MRI scan as well as images discussed the facet arthropathy with mild narrowing but no significant areas of compression.  We reviewed expected increase in symptoms as prednisone wean occurs and then resolution of symptoms as this finishes and she resumes her own cortisol production.  We discussed the possibility facet injections and she understands this would be additional cortisone and currently she is trying to wean off cortisone.  I plan to recheck her again in 3 months.  Follow-Up Instructions: Return in about 3 months (around 03/11/2020).   Orders:  No orders of the defined types were placed in this encounter.  No orders of the defined types were placed in this encounter.     Procedures: No procedures performed   Clinical Data: No additional findings.   Subjective: Chief Complaint  Patient presents with  . Lower Back - Pain    HPI 79 year old female first-time visit for chronic low back pain.  She had a video visit with Dr. Asencion Partridge for diagnosis of lumbar facet arthropathy.  She is on Fosamax for osteoporosis.  She has been diagnosed with polymyalgia rheumatica as well as history of depression.  Patient's had back pain for 2 years pain is worse on the right than left.  It sometimes seems to radiate other times not.  Generally it does not go down into her legs she denies numbness or tingling.  She was tapering off prednisone and methotrexate and noticed she has had some increased pain.  By the end of the day she has more pain in her hips.  No associated bowel or bladder symptoms.  MRI scan 06/09/2019 showed mild disc bulge  with facet hypertrophy L2-3, L3-4, and L4-5 with mild canal narrowing and mild bilateral L2-L4 foraminal stenosis.  Small right paracentral disc protrusion at T12-L1 without stenosis or impingement.  Review of Systems positive history for depression which is treated.  Osteopenia on Fosamax.  Polymyalgia rheumatica on prednisone long-term currently on taper.  Otherwise negative as pertains HPI.   Objective: Vital Signs: BP 117/80   Pulse 80   Ht 5\' 3"  (1.6 m)   Wt 115 lb (52.2 kg)   BMI 20.37 kg/m   Physical Exam Constitutional:      Appearance: She is well-developed.  HENT:     Head: Normocephalic.     Right Ear: External ear normal.     Left Ear: External ear normal.  Eyes:     Pupils: Pupils are equal, round, and reactive to light.  Neck:     Thyroid: No thyromegaly.     Trachea: No tracheal deviation.  Cardiovascular:     Rate and Rhythm: Normal rate.  Pulmonary:     Effort: Pulmonary effort is normal.  Abdominal:     Palpations: Abdomen is soft.  Skin:    General: Skin is warm and dry.  Neurological:     Mental Status: She is alert and oriented to person, place, and time.  Psychiatric:  Behavior: Behavior normal.     Ortho Exam patient have negative logroll the hip she goes from sitting to standing comfortably she is able to heel and toe walk.  Knees reach full extension.  Knee and ankle jerk are symmetrical.  Negative popliteal compression test.  Tib-fib joint is normal.  Anterior tib EHL is strong.  Specialty Comments:  No specialty comments available.  Imaging: No results found.   PMFS History: Patient Active Problem List   Diagnosis Date Noted  . Polymyalgia rheumatica ([facility]) 10/23/2019  . Spinal stenosis, lumbar region without neurogenic claudication 10/23/2019  . Lumbar facet arthropathy 10/23/2019  . 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 Spondylosis of cervical region without myelopathy or radiculopathy 05/29/2018  . Family history  of Parkinson disease 05/21/2016  . Seborrheic dermatitis 11/02/2014  . Osteopenia 07/13/2013  . Moderate episode of recurrent major depressive disorder ([facility]) 06/22/2013  . Dermatitis, atopic 01/30/2013  . Menopausal hot flushes 01/30/2013  . MRSA cellulitis 01/30/2013  . 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 Insomnia 03/18/2012  . Hypothyroidism 12/18/2010   Past Medical History:  Diagnosis Date  . Anxiety   . 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 Family history of Parkinson disease 05/21/2016   Mother  . GERD (gastroesophageal reflux disease)   . Kidney failure    blood transfusion  . Moderate episode of recurrent major depressive disorder ([facility])   . Osteoporosis   . Polymyalgia rheumatica ([facility]) 10/23/2019   Aug 2020, Dr. Estanislado Pandy  . Restless legs syndrome (RLS) 08/02/2017  . 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 Thyroid disease    Hypothyroid    Family History  Problem Relation Age of Onset  . Lupus Mother   . Asthma Mother   . Parkinson's disease Mother   . Depression Mother   . Heart disease Father   . Hyperlipidemia Father   . Stroke Father   . Alcohol abuse Daughter   . Depression Daughter     Past Surgical History:  Procedure Laterality Date  . APPENDECTOMY  1961  . BREAST SURGERY Bilateral 1987 1998   Lumpectomy  . EYE SURGERY    . TOTAL ABDOMINAL HYSTERECTOMY  1986   BSO/Fibroids   Social History   Occupational History  . Occupation: Retired  Tobacco Use  . Smoking status: Never Smoker  . Smokeless tobacco: Never Used  Substance and Sexual Activity  . Alcohol use: Yes    Comment: couple of times weekly (1 drink)  . Drug use: No  . Sexual activity: Never    Partners: Male    Birth control/protection: Post-menopausal

## 2020-01-01 ENCOUNTER — Other Ambulatory Visit: Payer: Self-pay | Admitting: Rheumatology

## 2020-01-01 NOTE — Telephone Encounter (Signed)
Last Visit: 11/10/19 Next Visit: 01/19/20 Labs: 11/03/2019 Protein is low due to chronic disease. Rest the labs are stable.  Current Dose per office note on 07/28/2019: Fosamax 70 mg p.o. weekly  Okay to refill per Dr. Corliss Skains

## 2020-01-02 ENCOUNTER — Other Ambulatory Visit: Payer: Self-pay | Admitting: Rheumatology

## 2020-01-04 NOTE — Telephone Encounter (Addendum)
Last Visit: 11/10/19 Next Visit: 01/19/20  Patient states she is on Prednisone 2 mg and due to taper to 1 mg on 01/18/2020.   Patient states she has enough Prednisone and does not need a refill at this time. Patient to call office when she needs refill.

## 2020-01-08 NOTE — Progress Notes (Signed)
Office Visit Note  Patient: Cheryl Austin             Date of Birth: [birth year]           MRN: [id number]             PCP: [name], MD Referring: [name], MD Visit Date: 01/19/2020 Occupation: @GUAROCC @  Subjective:  Other (patient would like to discuss MTX (patient would like to stop taking) )   History of Present Illness: Cheryl Austin is a 79 y.o. female with history of polymyalgia rheumatica and osteoarthritis.  She states she has been tapering prednisone faster than usual may be every 2 to 3 weeks.  She has been on prednisone 1 mg p.o. daily which she will be stopping in next 2 weeks.  She has been taking methotrexate 4 tablets/week which she is tolerating well.  She denies any muscular weakness or tenderness.  The osteoarthritis in her hands, cervical spine and lumbar spine has been tolerable.  Activities of Daily Living:  Patient reports morning stiffness for 0 minutes.   Patient Denies nocturnal pain.  Difficulty dressing/grooming: Denies Difficulty climbing stairs: Denies Difficulty getting out of chair: Denies Difficulty using hands for taps, buttons, cutlery, and/or writing: Denies  Review of Systems  Constitutional: Negative for fatigue, night sweats, weight gain and weight loss.  HENT: Negative for mouth sores, trouble swallowing, trouble swallowing, mouth dryness and nose dryness.   Eyes: Negative for pain, redness, itching, visual disturbance and dryness.  Respiratory: Negative for cough, shortness of breath and difficulty breathing.   Cardiovascular: Negative for chest pain, palpitations, hypertension, irregular heartbeat and swelling in legs/feet.  Gastrointestinal: Negative for blood in stool, constipation and diarrhea.  Endocrine: Negative for increased urination.  Genitourinary: Negative for difficulty urinating, painful urination and vaginal dryness.  Musculoskeletal: Positive for arthralgias and joint pain. Negative for joint swelling,  myalgias, muscle weakness, morning stiffness, muscle tenderness and myalgias.  Skin: Negative for color change, rash, hair loss, redness, skin tightness, ulcers and sensitivity to sunlight.  Allergic/Immunologic: Negative for susceptible to infections.  Neurological: Negative for dizziness, numbness, headaches, memory loss, night sweats and weakness.  Hematological: Negative for bruising/bleeding tendency and swollen glands.  Psychiatric/Behavioral: Positive for depressed mood. Negative for confusion and sleep disturbance. The patient is not nervous/anxious.     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   Spondylosis of cervical region without myelopathy or radiculopathy 05/29/2018   Family history of Parkinson disease 05/21/2016   Seborrheic dermatitis 11/02/2014   Osteopenia 07/13/2013   Moderate episode of recurrent major depressive disorder ([facility]) 06/22/2013   Dermatitis, atopic 01/30/2013   Menopausal hot flushes 01/30/2013   MRSA cellulitis 01/30/2013   Osteoarthritis, 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 10-18-1977   Restless legs syndrome (RLS)  08/02/2017   Spinal stenosis, lumbar region without neurogenic claudication 10/23/2019   By lumbar MRI 06/2019, mild   Thyroid disease     Hypothyroid    Family History  Problem Relation Age of Onset   Lupus Mother    Asthma Mother    Parkinson's disease Mother    Depression Mother    Heart disease Father    Hyperlipidemia Father    Stroke Father    Alcohol abuse Daughter    Depression Daughter    Past Surgical History:  Procedure Laterality Date   APPENDECTOMY  1961   BREAST SURGERY Bilateral 1987 1998   Lumpectomy   EYE SURGERY     TOTAL ABDOMINAL HYSTERECTOMY  1986   BSO/Fibroids   Social History   Social History Narrative   Drinks 2-3 caffeine drinks a day    Immunization History  Administered Date(s) Administered   DT (Pediatric) 05/21/2008   Influenza Split 06/20/2010, 05/14/2011, 06/18/2012, 06/22/2013, 07/02/2014, 07/05/2015   Influenza, High Dose Seasonal PF 06/18/2012, 06/18/2012, 06/22/2013, 06/22/2013, 07/02/2014, 07/02/2014, 07/05/2015, 07/05/2015, 05/21/2016, 05/21/2016, 08/02/2017   Influenza, Seasonal, Injecte, Preservative Fre 05/14/2011   Influenza,inj,Quad PF,6+ Mos 05/29/2018   Influenza,trivalent, recombinat, inj, PF 05/14/2011   Influenza-Unspecified 05/14/2011, 06/18/2012, 06/22/2013, 07/02/2014, 07/05/2015   PFIZER SARS-COV-2 Vaccination 10/11/2019, 11/04/2019   Pneumococcal Conjugate-13 07/04/2014, 07/04/2014   Pneumococcal Polysaccharide-23 03/20/2005, 06/22/2011   Pneumococcal-Unspecified 03/20/2005, 06/22/2011   Td 05/21/2008   Tdap 06/22/2011, 06/19/2013     Objective: Vital Signs: BP 127/80 (BP Location: Left Arm, Patient Position: Sitting, Cuff Size: Normal)    Pulse 69    Resp 13    Ht 5\' 3"  (1.6 m)    Wt 114 lb 12.8 oz (52.1 kg)    BMI 20.34 kg/m    Physical Exam Vitals and nursing note reviewed.  Constitutional:      Appearance: She is well-developed.  HENT:     Head: Normocephalic and atraumatic.  Eyes:     Conjunctiva/sclera: Conjunctivae normal.  Cardiovascular:     Rate and Rhythm: Normal rate and regular rhythm.     Heart sounds:  Normal heart sounds.  Pulmonary:     Effort: Pulmonary effort is normal.     Breath sounds: Normal breath sounds.  Abdominal:     General: Bowel sounds are normal.     Palpations: Abdomen is soft.  Musculoskeletal:     Cervical back: Normal range of motion.  Lymphadenopathy:     Cervical: No cervical adenopathy.  Skin:    General: Skin is warm and dry.     Capillary Refill: Capillary refill takes less than 2 seconds.  Neurological:     Mental Status: She is alert and oriented to person, place, and time.  Psychiatric:        Behavior: Behavior normal.      Musculoskeletal Exam: C-spine, thoracic and lumbar spine with good range of motion.  She has some discomfort range of motion of her lumbar spine.  Shoulder joints, elbow joints, wrist joints with good range of motion.  She had bilateral PIP and DIP thickening with no synovitis.  Hip joints, knee joints, ankles, MTPs and PIPs with good range of motion with no synovitis.  She had no muscular weakness or tenderness.  CDAI Exam: CDAI Score: -- Patient Global: --; Provider Global: -- Swollen: --; Tender: -- Joint Exam 01/19/2020   No joint exam has been documented for this visit   There is currently no information documented on the homunculus. Go to the  Rheumatology activity and complete the homunculus joint exam.  Investigation: No additional findings.  Imaging: No results found.  Recent Labs: Lab Results  Component Value Date   WBC 4.9 11/03/2019   HGB 13.0 11/03/2019   PLT 230 11/03/2019   NA 138 11/03/2019   K 4.0 11/03/2019   CL 103 11/03/2019   CO2 26 11/03/2019   GLUCOSE 90 11/03/2019   BUN 12 11/03/2019   CREATININE 0.67 11/03/2019   BILITOT 0.4 11/03/2019   ALKPHOS 91 05/14/2019   AST 31 11/03/2019   ALT 27 11/03/2019   PROT 5.7 (L) 11/03/2019   ALBUMIN 4.3 05/14/2019   CALCIUM 8.9 11/03/2019   GFRAA 98 11/03/2019   QFTBGOLDPLUS NEGATIVE 07/06/2019    Speciality Comments: No specialty comments  available.  Procedures:  No procedures performed Allergies: Patient has no known allergies.   Assessment / Plan:     Visit Diagnoses: Polymyalgia rheumatica (HCC)-patient is clinically doing well without any muscular weakness or tenderness.  She states she does not like being on medications.  She is tapered prednisone much faster and will be off prednisone in the next 2 weeks.  She also wanted to taper methotrexate.  I advised not to taper methotrexate as the chance of recurrence of polymyalgia will be high.  I would like to keep her on methotrexate for at least 2 years before tapering.  High risk medication use - Methotrexate 6 tablets by mouth once weekly and folic acid 1 mg po daily.  Her labs are stable.  We will check labs today and then every 3 months to monitor for drug toxicity.  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  Joint protection was discussed.  Spondylosis of cervical region without myelopathy or radiculopathy-she had good range of motion without much discomfort.  Spondylosis of lumbar region without myelopathy or radiculopathy-she had good range of motion with some discomfort.  Other medical problems are listed as follows:  Seborrheic dermatitis  Osteopenia of multiple sites  Vitamin D deficiency  Other insomnia  History of hypothyroidism  History of depression  History of MRSA infection  Family history of Parkinson disease  Family history of systemic lupus erythematosus (SLE) in mother  Orders: Orders Placed This Encounter  Procedures   CBC with Differential/Platelet   COMPLETE METABOLIC PANEL WITH GFR   Meds ordered this encounter  Medications   methotrexate (RHEUMATREX) 2.5 MG tablet    Sig: Take 6 tablets (15 mg total) by mouth once a week. Caution:Chemotherapy. Protect from light.    Dispense:  72 tablet    Refill:  0    .  Follow-Up Instructions: Return in about 3 months (around 04/20/2020) for PMR,  Osteoarthritis.   Bo Merino, MD  Note - This record has been created using Editor, commissioning.  Chart creation errors have been sought, but may not always  have been located. Such creation errors do not reflect on  the standard of medical care.

## 2020-01-17 ENCOUNTER — Other Ambulatory Visit: Payer: Self-pay | Admitting: Rheumatology

## 2020-01-19 ENCOUNTER — Ambulatory Visit: Payer: Medicare Other | Admitting: Rheumatology

## 2020-01-19 ENCOUNTER — Encounter: Payer: Self-pay | Admitting: Rheumatology

## 2020-01-19 ENCOUNTER — Other Ambulatory Visit: Payer: Self-pay

## 2020-01-19 VITALS — BP 127/80 | HR 69 | Resp 13 | Ht 63.0 in | Wt 114.8 lb

## 2020-01-19 DIAGNOSIS — M353 Polymyalgia rheumatica: Secondary | ICD-10-CM

## 2020-01-19 DIAGNOSIS — Z820 Family history of epilepsy and other diseases of the nervous system: Secondary | ICD-10-CM

## 2020-01-19 DIAGNOSIS — M47812 Spondylosis without myelopathy or radiculopathy, cervical region: Secondary | ICD-10-CM | POA: Diagnosis not present

## 2020-01-19 DIAGNOSIS — M8589 Other specified disorders of bone density and structure, multiple sites: Secondary | ICD-10-CM

## 2020-01-19 DIAGNOSIS — M19041 Primary osteoarthritis, right hand: Secondary | ICD-10-CM | POA: Diagnosis not present

## 2020-01-19 DIAGNOSIS — Z8614 Personal history of Methicillin resistant Staphylococcus aureus infection: Secondary | ICD-10-CM

## 2020-01-19 DIAGNOSIS — Z8269 Family history of other diseases of the musculoskeletal system and connective tissue: Secondary | ICD-10-CM

## 2020-01-19 DIAGNOSIS — L219 Seborrheic dermatitis, unspecified: Secondary | ICD-10-CM

## 2020-01-19 DIAGNOSIS — Z8639 Personal history of other endocrine, nutritional and metabolic disease: Secondary | ICD-10-CM

## 2020-01-19 DIAGNOSIS — Z79899 Other long term (current) drug therapy: Secondary | ICD-10-CM | POA: Diagnosis not present

## 2020-01-19 DIAGNOSIS — Z8659 Personal history of other mental and behavioral disorders: Secondary | ICD-10-CM

## 2020-01-19 DIAGNOSIS — E559 Vitamin D deficiency, unspecified: Secondary | ICD-10-CM

## 2020-01-19 DIAGNOSIS — M19042 Primary osteoarthritis, left hand: Secondary | ICD-10-CM

## 2020-01-19 DIAGNOSIS — M47816 Spondylosis without myelopathy or radiculopathy, lumbar region: Secondary | ICD-10-CM

## 2020-01-19 DIAGNOSIS — G4709 Other insomnia: Secondary | ICD-10-CM

## 2020-01-19 LAB — COMPLETE METABOLIC PANEL WITH GFR
AG Ratio: 2.2 (calc) (ref 1.0–2.5)
ALT: 18 U/L (ref 6–29)
AST: 23 U/L (ref 10–35)
Albumin: 4.4 g/dL (ref 3.6–5.1)
Alkaline phosphatase (APISO): 58 U/L (ref 37–153)
BUN: 8 mg/dL (ref 7–25)
CO2: 29 mmol/L (ref 20–32)
Calcium: 9.4 mg/dL (ref 8.6–10.4)
Chloride: 103 mmol/L (ref 98–110)
Creat: 0.72 mg/dL (ref 0.60–0.93)
GFR, Est African American: 94 mL/min/{1.73_m2} (ref >=60)
GFR, Est Non African American: 81 mL/min/{1.73_m2} (ref >=60)
Globulin: 2 g/dL (calc) (ref 1.9–3.7)
Glucose, Bld: 92 mg/dL (ref 65–99)
Potassium: 4.2 mmol/L (ref 3.5–5.3)
Sodium: 138 mmol/L (ref 135–146)
Total Bilirubin: 0.6 mg/dL (ref 0.2–1.2)
Total Protein: 6.4 g/dL (ref 6.1–8.1)

## 2020-01-19 LAB — CBC WITH DIFFERENTIAL/PLATELET
Absolute Monocytes: 340 cells/uL (ref 200–950)
Basophils Absolute: 22 cells/uL (ref 0–200)
Basophils Relative: 0.5 %
Eosinophils Absolute: 52 cells/uL (ref 15–500)
Eosinophils Relative: 1.2 %
HCT: 40.3 % (ref 35.0–45.0)
Hemoglobin: 13.5 g/dL (ref 11.7–15.5)
Lymphs Abs: 1243 cells/uL (ref 850–3900)
MCH: 29.9 pg (ref 27.0–33.0)
MCHC: 33.5 g/dL (ref 32.0–36.0)
MCV: 89.2 fL (ref 80.0–100.0)
MPV: 9.7 fL (ref 7.5–12.5)
Monocytes Relative: 7.9 %
Neutro Abs: 2645 cells/uL (ref 1500–7800)
Neutrophils Relative %: 61.5 %
Platelets: 210 10*3/uL (ref 140–400)
RBC: 4.52 10*6/uL (ref 3.80–5.10)
RDW: 13.1 % (ref 11.0–15.0)
Total Lymphocyte: 28.9 %
WBC: 4.3 10*3/uL (ref 3.8–10.8)

## 2020-01-19 MED ORDER — METHOTREXATE 2.5 MG PO TABS: 15.0000 mg | ORAL_TABLET | ORAL | 0 refills | Status: DC

## 2020-01-19 NOTE — Patient Instructions (Signed)
Standing Labs We placed an order today for your standing lab work.    Please come back and get your standing labs in September and every 3 months   We have open lab daily Monday through Thursday from 8:30-12:30 PM and 1:30-4:30 PM and Friday from 8:30-12:30 PM and 1:30-4:00 PM at the office of Dr. Hadeel Hillebrand.   You may experience shorter wait times on Monday and Friday afternoons. The office is located at [address], [address], [place], [place] [address] No appointment is necessary.   Labs are drawn by [facility].  You may receive a bill from [facility] for your lab work.  If you wish to have your labs drawn at another location, please call the office 24 hours in advance to send orders.  If you have any questions regarding directions or hours of operation,  please call [phone].   Just as a reminder please drink plenty of water prior to coming for your lab work. Thanks!   

## 2020-01-19 NOTE — Telephone Encounter (Deleted)
ref

## 2020-02-23 ENCOUNTER — Other Ambulatory Visit: Payer: Self-pay

## 2020-02-23 ENCOUNTER — Encounter: Payer: Self-pay | Admitting: Dermatology

## 2020-02-23 ENCOUNTER — Ambulatory Visit: Payer: Medicare Other | Admitting: Dermatology

## 2020-02-23 DIAGNOSIS — L2084 Intrinsic (allergic) eczema: Secondary | ICD-10-CM | POA: Diagnosis not present

## 2020-02-23 DIAGNOSIS — L821 Other seborrheic keratosis: Secondary | ICD-10-CM | POA: Diagnosis not present

## 2020-02-23 NOTE — Patient Instructions (Addendum)
zFirst visit in many years for Lakeria Starkman date of birth 08/17/1941.  She had a growth on her left cheek and saw a young dermatologist at [facility] dermatologist who removed it and told Ms. Snell that it was benign.  Examination showed a small discoloration on the left inner cheek with no residual growth which I suspect was an irritated keratosis.  On her left shoulder and a half dozen spots on her back are textured tan typical benign keratoses.  It is up to Ms. Koran if she decides she wants these removed which can be done with freezing or simple scraping.  There are no atypical moles or signs of skin cancer.  We focused mostly on her history of having diffusely dry skin to the point of scaling, and I told [name] that this can be either an inflammatory dryness like with eczema or simple dryness called xerosis.  She will  initially treat by first trying to hydrate her skin by moist wrap or safe tub bath for 10 to 15 minutes and then apply Lac-Hydrin lotion (available at most any pharmacy or [employer]).  There is a risk of stinging or burning particularly if she shaves her legs before application.  Should this not produce improvement, I have asked her to please call my office in 1 month and we will try a low potency anti-inflammatory like TAC.  Routine follow-up as needed.

## 2020-02-28 ENCOUNTER — Other Ambulatory Visit: Payer: Self-pay | Admitting: Rheumatology

## 2020-02-29 NOTE — Telephone Encounter (Signed)
Last Visit: 01/19/2020  Next Visit: 04/19/2020  Current Dose per office note on 01/19/2020: dose/med not mentioned.   Okay to refill betamethasone cream?

## 2020-03-11 ENCOUNTER — Ambulatory Visit: Payer: Medicare Other | Admitting: Orthopaedic Surgery

## 2020-03-14 NOTE — Progress Notes (Signed)
   New Patient   Subjective  Cheryl Austin is a 79 y.o. female who presents for the following: Skin Problem (any treamtent for extremely dry skin all over).  Dry skin Location: All over Duration:  Quality:  Associated Signs/Symptoms: Modifying Factors:  Severity:  Timing: Context:    The following portions of the chart were reviewed this encounter and updated as appropriate: Tobacco  Allergies  Meds  Problems  Med Hx  Surg Hx  Fam Hx      Objective  Well appearing patient in no apparent distress; mood and affect are within normal limits.  All sun exposed areas plus back examined.   Assessment & Plan  Intrinsic eczema (2) Left Lower Leg - Anterior; Right Lower Leg - Anterior  If over-the-counter Lac-Hydrin lotion is either ineffective or not tolerated, [name] will give me a call leg she could try some dilute triamcinolone.  Seborrheic keratosis (4) Left Breast; Left Upper Back; Left Lower Back; Right Lower Back  Patient states she would like these treated; she was told that Medicare, we would not cover this and cosmetic procedures are not the focus of my practice.

## 2020-03-24 ENCOUNTER — Other Ambulatory Visit: Payer: Self-pay

## 2020-03-24 ENCOUNTER — Telehealth (INDEPENDENT_AMBULATORY_CARE_PROVIDER_SITE_OTHER): Payer: Medicare Other | Admitting: Family Medicine

## 2020-03-24 DIAGNOSIS — R21 Rash and other nonspecific skin eruption: Secondary | ICD-10-CM

## 2020-03-24 MED ORDER — MUPIROCIN 2 % EX OINT: TOPICAL_OINTMENT | CUTANEOUS | 0 refills | Status: DC

## 2020-03-24 NOTE — Progress Notes (Signed)
Virtual Visit via Video Note  I connected with Cheryl Austin  on 03/24/20 at 10:40 AM EDT by a video enabled telemedicine application and verified that I am speaking with the correct person using two identifiers.  Location patient: home Location provider:work or home office Persons participating in the virtual visit: patient, provider  I discussed the limitations of evaluation and management by telemedicine and the availability of in person appointments. The patient expressed understanding and agreed to proceed.   HPI:  Acute visit for a rash on her legs: -started a few weeks ago -has had a few scattered white pustule or then turn red and then heal - they come and go -they are sore -no drainage -she has a history of MRSA in the past and this reminds her or this -she feels she has had this in the past -no lesions elsewhere, malaise -she does have a dermatologist for eczema and a rheumatologist  ROS: See pertinent positives and negatives per HPI.  Past Medical History:  Diagnosis Date  . Anxiety   . 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 Family history of Parkinson disease 05/21/2016   Mother  . GERD (gastroesophageal reflux disease)   . Kidney failure    blood transfusion  . Moderate episode of recurrent major depressive disorder (HCC)   . Osteoporosis   . Polymyalgia rheumatica (HCC) 10/23/2019   Aug 2020, Dr. Corliss Skains  . Restless legs syndrome (RLS) 08/02/2017  . 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 Thyroid disease    Hypothyroid    Past Surgical History:  Procedure Laterality Date  . APPENDECTOMY  1961  . BREAST SURGERY Bilateral 1987 1998   Lumpectomy  . EYE SURGERY    . TOTAL ABDOMINAL HYSTERECTOMY  1986   BSO/Fibroids    Family History  Problem Relation Age of Onset  . Lupus Mother   . Asthma Mother   . Parkinson's disease Mother   . Depression  Mother   . Heart disease Father   . Hyperlipidemia Father   . Stroke Father   . Alcohol abuse Daughter   . Depression Daughter     SOCIAL HX: see hpi   Current Outpatient Medications:  .  betamethasone dipropionate 0.05 % cream, APPLY TO AFFECTED AREA TWICE A DAY, Disp: 30 g, Rfl: 0 .  alendronate (FOSAMAX) 70 MG tablet, TAKE 1 TABLET ONCE A WEEK. TAKE WITH A FULL GLASS OF WATER ON AN EMPTY STOMACH., Disp: 12 tablet, Rfl: 0 .  folic acid (FOLVITE) 1 MG tablet, Take 1 tablet (1 mg total) by mouth daily., Disp: 90 tablet, Rfl: 2 .  levothyroxine (SYNTHROID) 50 MCG tablet, TAKE 1 TABLET BY MOUTH EVERY DAY, Disp: 90 tablet, Rfl: 1 .  LORazepam (ATIVAN) 0.5 MG tablet, Take 1-2 tablets by mouth daily as needed., Disp: 30 tablet, Rfl: 2 .  methotrexate (RHEUMATREX) 2.5 MG tablet, Take 6 tablets (15 mg total) by mouth once a week. Caution:Chemotherapy. Protect from light., Disp: 72 tablet, Rfl: 0 .  mupirocin ointment (BACTROBAN) 2 %, Apply small amount twice daily to the affected area., Disp: 22 g, Rfl: 0 .  predniSONE (DELTASONE) 1 MG tablet, Take 3 tablets (3 mg total) by mouth daily with breakfast. (Patient taking differently: Take 1 mg by mouth daily with breakfast. ), Disp: 90 tablet, Rfl: 0 .  sertraline (ZOLOFT) 100 MG tablet, Take 1 tablet by mouth daily., Disp: ,  Rfl:  .  traZODone (DESYREL) 100 MG tablet, Take 100 mg by mouth as needed for sleep., Disp: , Rfl:   EXAM:  VITALS per patient if applicable:  GENERAL: alert, oriented, appears well and in no acute distress  HEENT: atraumatic, conjunttiva clear, no obvious abnormalities on inspection of external nose and ears  NECK: normal movements of the head and neck  LUNGS: on inspection no signs of respiratory distress, breathing rate appears normal, no obvious gross SOB, gasping or wheezing  CV: no obvious cyanosis  SKIN: on limited video visit exam she was able to show me one healing lesion on the leg  - small erythematous  papule with scab.  MS: moves all visible extremities without noticeable abnormality  PSYCH/NEURO: pleasant and cooperative, no obvious depression or anxiety, speech and thought processing grossly intact  ASSESSMENT AND PLAN:  Discussed the following assessment and plan:  Rash  -we discussed possible serious and likely etiologies, options for evaluation and workup, limitations of telemedicine visit vs in person visit, treatment, treatment risks and precautions. Pt prefers to treat via telemedicine empirically rather then risking or undertaking an in person visit at this moment. She is concerned abuot small MRSA lesions and prefers to try treatment of the one drying and one new lesion she currently has with topical mupirocin oint - sent to pharmacy. Discussed could be infection, insect bites or rash from systemic condition vs other. Advised if doe snot clear with this treatment over the next 1-2 weeks or if worsens to seek prompt in person evaluation with her dermatologist or PCP. She agrees. .   I discussed the assessment and treatment plan with the patient. The patient was provided an opportunity to ask questions and all were answered. The patient agreed with the plan and demonstrated an understanding of the instructions.   The patient was advised to call back or seek an in-person evaluation if the symptoms worsen or if the condition fails to improve as anticipated.   Terressa Koyanagi, DO

## 2020-04-06 ENCOUNTER — Other Ambulatory Visit: Payer: Self-pay | Admitting: Rheumatology

## 2020-04-06 ENCOUNTER — Encounter: Payer: Self-pay | Admitting: *Deleted

## 2020-04-06 NOTE — Progress Notes (Signed)
Office Visit Note  Patient: Cheryl Austin             Date of Birth: 11-06-1940           MRN: [id number]             PCP: [name], MD Referring: [name], MD Visit Date: 04/19/2020 Occupation: @GUAROCC @  Subjective:  Medication monitoring.   History of Present Illness: Cheryl Austin is a 79 y.o. female with history of polymyalgia rheumatica.  She decided to taper off prednisone much quicker and discontinued it in June.  She has been taking methotrexate 6 tablets/week.  She is also wanting to get off methotrexate.  She believes that her symptoms started due to increased stress.  She would like to lower the methotrexate dose and then discontinue.  She is not having much discomfort in her hands.  She has some discomfort in her lower back  Activities of Daily Living:  Patient reports morning stiffness for a few minutes.   Patient Denies nocturnal pain.  Difficulty dressing/grooming: Denies Difficulty climbing stairs: Denies Difficulty getting out of chair: Denies Difficulty using hands for taps, buttons, cutlery, and/or writing: Denies  Review of Systems  Constitutional: Negative for fatigue.  HENT: Positive for nose dryness. Negative for mouth sores and mouth dryness.   Eyes: Positive for dryness.  Respiratory: Negative for shortness of breath and difficulty breathing.   Cardiovascular: Negative for chest pain and palpitations.  Gastrointestinal: Negative for blood in stool, constipation and diarrhea.  Endocrine: Negative for increased urination.  Genitourinary: Negative for difficulty urinating.  Musculoskeletal: Positive for morning stiffness. Negative for arthralgias, joint pain, joint swelling, myalgias, muscle tenderness and myalgias.  Skin: Positive for rash. Negative for color change and hair loss.  Allergic/Immunologic: Negative for susceptible to infections.  Neurological: Negative for dizziness, numbness, headaches, memory loss and weakness.    Hematological: Positive for bruising/bleeding tendency.  Psychiatric/Behavioral: Positive for sleep disturbance. Negative for confusion.    PMFS History:  Patient Active Problem List   Diagnosis Date Noted  . Polymyalgia rheumatica (HCC) 10/23/2019  . Spinal stenosis, lumbar region without neurogenic claudication 10/23/2019  . Lumbar facet arthropathy 10/23/2019  . 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 Spondylosis of cervical region without myelopathy or radiculopathy 05/29/2018  . Family history of Parkinson disease 05/21/2016  . Seborrheic dermatitis 11/02/2014  . Osteopenia 07/13/2013  . Moderate episode of recurrent major depressive disorder (HCC) 06/22/2013  . Dermatitis, atopic 01/30/2013  . Menopausal hot flushes 01/30/2013  . MRSA cellulitis 01/30/2013  . 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 Insomnia 03/18/2012  . Hypothyroidism 12/18/2010    Past Medical History:  Diagnosis Date  . Anxiety   . 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 Family history of Parkinson disease 05/21/2016   Mother  . GERD (gastroesophageal reflux disease)   . Kidney failure    blood transfusion  . Moderate episode of recurrent major depressive disorder (HCC)   . Osteoporosis   . Polymyalgia rheumatica (HCC) 10/23/2019   Aug 2020, Dr. 10-18-1977  . Restless legs syndrome (RLS) 08/02/2017  . 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 Thyroid disease    Hypothyroid    Family History  Problem Relation Age of Onset  . Lupus Mother   . Asthma Mother   . Parkinson's disease Mother   . Depression Mother   .  Heart disease Father   . Hyperlipidemia Father   . Stroke Father   . Alcohol abuse Daughter   . Depression Daughter    Past Surgical History:  Procedure Laterality Date  . APPENDECTOMY  1961  . BREAST SURGERY Bilateral 1987 1998   Lumpectomy  . EYE SURGERY    .  TOTAL ABDOMINAL HYSTERECTOMY  1986   BSO/Fibroids   Social History   Social History Narrative   Drinks 2-3 caffeine drinks a day    Immunization History  Administered Date(s) Administered  . DT (Pediatric) 05/21/2008  . Influenza Split 06/20/2010, 05/14/2011, 06/18/2012, 06/22/2013, 07/02/2014, 07/05/2015  . Influenza, High Dose Seasonal PF 06/18/2012, 06/18/2012, 06/22/2013, 06/22/2013, 07/02/2014, 07/02/2014, 07/05/2015, 07/05/2015, 05/21/2016, 05/21/2016, 08/02/2017  . Influenza, Seasonal, Injecte, Preservative Fre 05/14/2011  . Influenza,inj,Quad PF,6+ Mos 05/29/2018  . Influenza,trivalent, recombinat, inj, PF 05/14/2011  . Influenza-Unspecified 05/14/2011, 06/18/2012, 06/22/2013, 07/02/2014, 07/05/2015  . PFIZER SARS-COV-2 Vaccination 10/11/2019, 11/04/2019  . Pneumococcal Conjugate-13 07/04/2014, 07/04/2014  . Pneumococcal Polysaccharide-23 03/20/2005, 06/22/2011  . Pneumococcal-Unspecified 03/20/2005, 06/22/2011  . Td 05/21/2008  . Tdap 06/22/2011, 06/19/2013     Objective: Vital Signs: BP 114/66 (BP Location: Left Arm, Patient Position: Sitting, Cuff Size: Normal)   Pulse 73   Resp 13   Ht 5\' 3"  (1.6 m)   Wt 116 lb 6.4 oz (52.8 kg)   BMI 20.62 kg/m    Physical Exam Vitals and nursing note reviewed.  Constitutional:      Appearance: She is well-developed.  HENT:     Head: Normocephalic and atraumatic.  Eyes:     Conjunctiva/sclera: Conjunctivae normal.  Cardiovascular:     Rate and Rhythm: Normal rate and regular rhythm.     Heart sounds: Normal heart sounds.  Pulmonary:     Effort: Pulmonary effort is normal.     Breath sounds: Normal breath sounds.  Abdominal:     General: Bowel sounds are normal.     Palpations: Abdomen is soft.  Musculoskeletal:     Cervical back: Normal range of motion.  Lymphadenopathy:     Cervical: No cervical adenopathy.  Skin:    General: Skin is warm and dry.     Capillary Refill: Capillary refill takes less than 2 seconds.    Neurological:     Mental Status: She is alert and oriented to person, place, and time.  Psychiatric:        Behavior: Behavior normal.      Musculoskeletal Exam: She had limited range of motion of her cervical and lumbar spine.  Shoulder joints, elbow joints, wrist joints with good range of motion.  PIP and DIP thickening was noted in her hands and feet.  Hip joints, knee joints, ankles with good range of motion.  She has no muscular weakness or tenderness.  She had no difficulty getting up from the chair.  CDAI Exam: CDAI Score: -- Patient Global: --; Provider Global: -- Swollen: --; Tender: -- Joint Exam 04/19/2020   No joint exam has been documented for this visit   There is currently no information documented on the homunculus. Go to the Rheumatology activity and complete the homunculus joint exam.  Investigation: No additional findings.  Imaging: No results found.  Recent Labs: Lab Results  Component Value Date   WBC 4.3 01/19/2020   HGB 13.5 01/19/2020   PLT 210 01/19/2020   NA 138 01/19/2020   K 4.2 01/19/2020   CL 103 01/19/2020   CO2 29 01/19/2020   GLUCOSE 92 01/19/2020  BUN 8 01/19/2020   CREATININE 0.72 01/19/2020   BILITOT 0.6 01/19/2020   ALKPHOS 91 05/14/2019   AST 23 01/19/2020   ALT 18 01/19/2020   PROT 6.4 01/19/2020   ALBUMIN 4.3 05/14/2019   CALCIUM 9.4 01/19/2020   GFRAA 94 01/19/2020   QFTBGOLDPLUS NEGATIVE 07/06/2019    Speciality Comments: No specialty comments available.  Procedures:  No procedures performed Allergies: Patient has no known allergies.   Assessment / Plan:     Visit Diagnoses: Polymyalgia rheumatica (HCC)-patient rapidly had decreased prednisone and came off the prednisone in June.  She continues to do well without any muscle weakness or tenderness.  She would like to decrease methotrexate to 3 tablets p.o. weekly.  She also requests getting sed rate today.  High risk medication use - Methotrexate 6 tablets by mouth  once weekly and folic acid 1 mg po daily.  Her labs are stable.  She will reduce methotrexate to 3 tablets p.o. weekly.  We will check labs today and then every 3 months to monitor for drug toxicity.  Primary osteoarthritis of both hands-joint protection muscle strengthening was discussed.  Spondylosis of cervical region without myelopathy or radiculopathy  Spondylosis of lumbar region without myelopathy or radiculopathy-she continues to have some lower back pain.  Core muscle strengthening exercises were discussed.  Seborrheic dermatitis  Osteopenia of multiple sites - Fosamax.  She also is considering coming off Fosamax.  I reviewed her bone density from 2016 and 2019.  She had drop in BMD but she was in osteopenia range.  She states she would like to discuss that further at the next follow-up visit.  Vitamin D deficiency-she is on vitamin D supplement.  Other insomnia  History of depression  History of hypothyroidism  History of MRSA infection  Family history of systemic lupus erythematosus (SLE) in mother  Family history of Parkinson disease  She is fully immunized against COVID-19.  Use of mask, social distancing and hand hygiene was emphasized.  Have also advised her to get COVID-19 booster.  Use of monoclonal antibodies was also discussed in case she develops COVID-19 infection.  Orders: Orders Placed This Encounter  Procedures  . CBC with Differential/Platelet  . COMPLETE METABOLIC PANEL WITH GFR  . Sedimentation rate   Meds ordered this encounter  Medications  . methotrexate (RHEUMATREX) 2.5 MG tablet    Sig: Take 3 tablets (7.5 mg total) by mouth once a week. Caution:Chemotherapy. Protect from light.    Dispense:  36 tablet    Refill:  0    Follow-Up Instructions: Return in about 5 months (around 09/19/2020) for Polymyalgia rheumatica.   Pollyann Savoy, MD  Note - This record has been created using Animal nutritionist.  Chart creation errors have been sought,  but may not always  have been located. Such creation errors do not reflect on  the standard of medical care.

## 2020-04-07 ENCOUNTER — Other Ambulatory Visit: Payer: Self-pay | Admitting: Rheumatology

## 2020-04-07 NOTE — Telephone Encounter (Signed)
Last Visit: 01/19/2020  Next Visit: 04/19/2020 Labs: 01/19/2020 CBC and CMP WNL  Current Dose per office note 01/19/2020: not mentioned.  Okay to refill Fosamax?

## 2020-04-15 ENCOUNTER — Other Ambulatory Visit: Payer: Self-pay | Admitting: Rheumatology

## 2020-04-19 ENCOUNTER — Encounter: Payer: Self-pay | Admitting: Rheumatology

## 2020-04-19 ENCOUNTER — Other Ambulatory Visit: Payer: Self-pay

## 2020-04-19 ENCOUNTER — Ambulatory Visit: Payer: Medicare Other | Admitting: Rheumatology

## 2020-04-19 VITALS — BP 114/66 | HR 73 | Resp 13 | Ht 63.0 in | Wt 116.4 lb

## 2020-04-19 DIAGNOSIS — Z8659 Personal history of other mental and behavioral disorders: Secondary | ICD-10-CM

## 2020-04-19 DIAGNOSIS — M19042 Primary osteoarthritis, left hand: Secondary | ICD-10-CM

## 2020-04-19 DIAGNOSIS — Z8614 Personal history of Methicillin resistant Staphylococcus aureus infection: Secondary | ICD-10-CM

## 2020-04-19 DIAGNOSIS — M47816 Spondylosis without myelopathy or radiculopathy, lumbar region: Secondary | ICD-10-CM

## 2020-04-19 DIAGNOSIS — Z79899 Other long term (current) drug therapy: Secondary | ICD-10-CM

## 2020-04-19 DIAGNOSIS — M47812 Spondylosis without myelopathy or radiculopathy, cervical region: Secondary | ICD-10-CM

## 2020-04-19 DIAGNOSIS — M353 Polymyalgia rheumatica: Secondary | ICD-10-CM

## 2020-04-19 DIAGNOSIS — G4709 Other insomnia: Secondary | ICD-10-CM

## 2020-04-19 DIAGNOSIS — E559 Vitamin D deficiency, unspecified: Secondary | ICD-10-CM

## 2020-04-19 DIAGNOSIS — M8589 Other specified disorders of bone density and structure, multiple sites: Secondary | ICD-10-CM

## 2020-04-19 DIAGNOSIS — Z8269 Family history of other diseases of the musculoskeletal system and connective tissue: Secondary | ICD-10-CM

## 2020-04-19 DIAGNOSIS — M19041 Primary osteoarthritis, right hand: Secondary | ICD-10-CM | POA: Diagnosis not present

## 2020-04-19 DIAGNOSIS — L219 Seborrheic dermatitis, unspecified: Secondary | ICD-10-CM

## 2020-04-19 DIAGNOSIS — Z820 Family history of epilepsy and other diseases of the nervous system: Secondary | ICD-10-CM

## 2020-04-19 DIAGNOSIS — Z8639 Personal history of other endocrine, nutritional and metabolic disease: Secondary | ICD-10-CM

## 2020-04-19 MED ORDER — METHOTREXATE 2.5 MG PO TABS: 7.5000 mg | ORAL_TABLET | ORAL | 0 refills | Status: DC

## 2020-04-19 NOTE — Patient Instructions (Signed)
Standing Labs We placed an order today for your standing lab work.   Please have your standing labs drawn in December and every 3 months   If possible, please have your labs drawn 2 weeks prior to your appointment so that the provider can discuss your results at your appointment.  We have open lab daily Monday through Thursday from 8:30-12:30 PM and 1:30-4:30 PM and Friday from 8:30-12:30 PM and 1:30-4:00 PM at the office of Dr. Frankye Schwegel, [facility].   Please be advised, patients with office appointments requiring lab work will take precedents over walk-in lab work.  If possible, please come for your lab work on Monday and Friday afternoons, as you may experience shorter wait times. The office is located at [address], [address], [place], [place] [address] No appointment is necessary.   Labs are drawn by [facility]. Please bring your co-pay at the time of your lab draw.  You may receive a bill from [facility] for your lab work.  If you wish to have your labs drawn at another location, please call the office 24 hours in advance to send orders.  If you have any questions regarding directions or hours of operation,  please call [phone].   As a reminder, please drink plenty of water prior to coming for your lab work. Thanks!   COVID-19 vaccine recommendations:   COVID-19 vaccine is recommended for everyone (unless you are allergic to a vaccine component), even if you are on a medication that suppresses your immune system.   If you are on Methotrexate, Cellcept (mycophenolate), Rinvoq, Xeljanz, and Olumiant- hold the medication for 1 week after each vaccine. Hold Methotrexate for 2 weeks after the single dose COVID-19 vaccine.   If you are on Orencia subcutaneous injection - hold medication one week prior to and one week after the first COVID-19 vaccine dose (only).   If you are on Orencia IV infusions- time vaccination administration so that the first COVID-19  vaccination will occur four weeks after the infusion and postpone the subsequent infusion by one week.   If you are on Cyclophosphamide or Rituxan infusions please contact your doctor prior to receiving the COVID-19 vaccine.   Do not take Tylenol or any anti-inflammatory medications (NSAIDs) 24 hours prior to the COVID-19 vaccination.   There is no direct evidence about the efficacy of the COVID-19 vaccine in individuals who are on medications that suppress the immune system.   Even if you are fully vaccinated, and you are on any medications that suppress your immune system, please continue to wear a mask, maintain at least six feet social distance and practice hand hygiene.   If you develop a COVID-19 infection, please contact your PCP or our office to determine if you need antibody infusion.  The booster vaccine is now available for immunocompromised patients. It is advised that if you had Pfizer vaccine you should get Pfizer booster.  If you had a Moderna vaccine then you should get a Moderna booster. Johnson and Johnson does not have a booster vaccine at this time.  Please see the following web sites for updated information.   [email or web address]://[email or web address]/[email or web address]-Vaccination-Patient-Resources.[email or web address]  [email or web address]://[email or web address]/About-[email or web address]-Releases/ID/1159    

## 2020-04-20 LAB — COMPLETE METABOLIC PANEL WITH GFR
AG Ratio: 2.4 (calc) (ref 1.0–2.5)
ALT: 25 U/L (ref 6–29)
AST: 33 U/L (ref 10–35)
Albumin: 4.4 g/dL (ref 3.6–5.1)
Alkaline phosphatase (APISO): 68 U/L (ref 37–153)
BUN: 12 mg/dL (ref 7–25)
CO2: 27 mmol/L (ref 20–32)
Calcium: 9.3 mg/dL (ref 8.6–10.4)
Chloride: 100 mmol/L (ref 98–110)
Creat: 0.76 mg/dL (ref 0.60–0.93)
GFR, Est African American: 88 mL/min/{1.73_m2} (ref >=60)
GFR, Est Non African American: 76 mL/min/{1.73_m2} (ref >=60)
Globulin: 1.8 g/dL (calc) — ABNORMAL LOW (ref 1.9–3.7)
Glucose, Bld: 83 mg/dL (ref 65–99)
Potassium: 4.3 mmol/L (ref 3.5–5.3)
Sodium: 135 mmol/L (ref 135–146)
Total Bilirubin: 0.7 mg/dL (ref 0.2–1.2)
Total Protein: 6.2 g/dL (ref 6.1–8.1)

## 2020-04-20 LAB — CBC WITH DIFFERENTIAL/PLATELET
Absolute Monocytes: 357 cells/uL (ref 200–950)
Basophils Absolute: 22 cells/uL (ref 0–200)
Basophils Relative: 0.5 %
Eosinophils Absolute: 52 cells/uL (ref 15–500)
Eosinophils Relative: 1.2 %
HCT: 37.9 % (ref 35.0–45.0)
Hemoglobin: 12.7 g/dL (ref 11.7–15.5)
Lymphs Abs: 1247 cells/uL (ref 850–3900)
MCH: 30.2 pg (ref 27.0–33.0)
MCHC: 33.5 g/dL (ref 32.0–36.0)
MCV: 90 fL (ref 80.0–100.0)
MPV: 9.6 fL (ref 7.5–12.5)
Monocytes Relative: 8.3 %
Neutro Abs: 2623 cells/uL (ref 1500–7800)
Neutrophils Relative %: 61 %
Platelets: 239 10*3/uL (ref 140–400)
RBC: 4.21 10*6/uL (ref 3.80–5.10)
RDW: 13.2 % (ref 11.0–15.0)
Total Lymphocyte: 29 %
WBC: 4.3 10*3/uL (ref 3.8–10.8)

## 2020-04-20 LAB — SEDIMENTATION RATE: Sed Rate: 2 mm/h (ref 0–30)

## 2020-04-20 NOTE — Progress Notes (Signed)
CBC, CMP and sedimentation rate are within normal limits.

## 2020-04-27 ENCOUNTER — Ambulatory Visit: Payer: Medicare Other | Admitting: Family Medicine

## 2020-04-27 ENCOUNTER — Encounter: Payer: Self-pay | Admitting: Family Medicine

## 2020-04-27 ENCOUNTER — Other Ambulatory Visit: Payer: Self-pay

## 2020-04-27 VITALS — BP 108/70 | HR 75 | Temp 97.3°F | Resp 18 | Ht 63.0 in | Wt 114.6 lb

## 2020-04-27 DIAGNOSIS — Z0000 Encounter for general adult medical examination without abnormal findings: Secondary | ICD-10-CM

## 2020-04-27 DIAGNOSIS — M48061 Spinal stenosis, lumbar region without neurogenic claudication: Secondary | ICD-10-CM

## 2020-04-27 DIAGNOSIS — Z780 Asymptomatic menopausal state: Secondary | ICD-10-CM

## 2020-04-27 DIAGNOSIS — E039 Hypothyroidism, unspecified: Secondary | ICD-10-CM | POA: Diagnosis not present

## 2020-04-27 DIAGNOSIS — L209 Atopic dermatitis, unspecified: Secondary | ICD-10-CM

## 2020-04-27 DIAGNOSIS — M353 Polymyalgia rheumatica: Secondary | ICD-10-CM

## 2020-04-27 DIAGNOSIS — Z23 Encounter for immunization: Secondary | ICD-10-CM | POA: Diagnosis not present

## 2020-04-27 DIAGNOSIS — R05 Cough: Secondary | ICD-10-CM

## 2020-04-27 DIAGNOSIS — G4709 Other insomnia: Secondary | ICD-10-CM | POA: Diagnosis not present

## 2020-04-27 DIAGNOSIS — M47816 Spondylosis without myelopathy or radiculopathy, lumbar region: Secondary | ICD-10-CM

## 2020-04-27 DIAGNOSIS — F331 Major depressive disorder, recurrent, moderate: Secondary | ICD-10-CM

## 2020-04-27 DIAGNOSIS — M8580 Other specified disorders of bone density and structure, unspecified site: Secondary | ICD-10-CM | POA: Diagnosis not present

## 2020-04-27 DIAGNOSIS — Z79899 Other long term (current) drug therapy: Secondary | ICD-10-CM

## 2020-04-27 DIAGNOSIS — R053 Chronic cough: Secondary | ICD-10-CM

## 2020-04-27 MED ORDER — BENZONATATE 100 MG PO CAPS: 100.0000 mg | ORAL_CAPSULE | Freq: Two times a day (BID) | ORAL | 0 refills | Status: DC | PRN

## 2020-04-27 MED ORDER — BETAMETHASONE DIPROPIONATE 0.05 % EX CREA
TOPICAL_CREAM | CUTANEOUS | 0 refills | Status: DC
Start: 2020-04-27 — End: 2022-10-04

## 2020-04-27 NOTE — Patient Instructions (Addendum)
Please return in 6 months for mood recheck and back pain recheck.  I will release your lab results to you on your MyChart account with further instructions. Please reply with any questions.   Today you were given your High dose flu vaccination.   If you have any questions or concerns, please don't hesitate to send me a message via MyChart or call the office at [phone]. Thank you for visiting with [place] today! It's our pleasure caring for you.  I have ordered a mammogram and/or bone density for you as we discussed today: []   Mammogram  [x]   Bone Density  Please call the office checked below to schedule your appointment: Your appointment will at the following location  [x]   [facility]      [address] [place],        [address] Wing         []   [facility]  [address] [place],  [facility]

## 2020-04-27 NOTE — Progress Notes (Signed)
Subjective  Chief Complaint  Patient presents with  . Annual Exam    Non fasting labs  . Cough    Would likd to discuss her chronic cough     HPI: Cheryl Austin is a 79 y.o. female who presents to [facility] [place] today for a Female Wellness Visit. She also has the concerns and/or needs as listed above in the chief complaint. These will be addressed in addition to the Health Maintenance Visit.   Wellness Visit: annual visit with health maintenance review and exam without Pap   HM: Patient refuses mammogram.  Due for bone density in November.  Has been on Fosamax for almost a year since had low T score, and chronic prednisone use.  Tolerating Fosamax well.  Takes vitamin D.  No recent falls or fractures.  Immunizations up-to-date, due for flu vaccine today. Chronic disease f/u and/or acute problem visit: (deemed necessary to be done in addition to the wellness visit):  Chronic back pain, PMR, lumbar DJD with stenosis: Reviewed recent rheumatology notes.  Reviewed recent lab work.  Sed rate is normal.  Normal CMP.  Patient weaned herself off of prednisone and feels that her symptoms are resolved.  No rebound symptoms currently.  Still has chronic low back pain.  Hypothyroidism on daily levothyroxine.  Reports energy is a bit down.  No lower extremity edema, sweats [place] intolerances.  Due for recheck.  Depression, high stress: Stabilizing.  On Zoloft, believes it is helpful.  Still working with lawyers on lawsuits.  Hoping to some point downsize and move out of her home.  Struggles with decisions.  Sleep is fair.  On trazodone.  Complains of seasonal, recurrent cough.  Sometimes with spasm.  Admits to PND at times.  Some sneezing and clear rhinorrhea now.  No reflux symptoms currently but has had in the past.  Typically gets in the fall and early winter annually.  Assessment  1. Annual physical exam   2. Acquired hypothyroidism   3. Other insomnia   4.  Osteopenia, unspecified location   5. Spinal stenosis, lumbar region without neurogenic claudication   6. Polymyalgia rheumatica (HCC)   7. Moderate episode of recurrent major depressive disorder (HCC)   8. Lumbar facet arthropathy   9. Asymptomatic menopausal state   10. High risk medication use   11. Need for influenza vaccination   12. Chronic cough      Plan  Female Wellness Visit:  Age appropriate Health Maintenance and Prevention measures were discussed with patient. Included topics are cancer screening recommendations, ways to keep healthy (see AVS) including dietary and exercise recommendations, regular eye and dental care, use of seat belts, and avoidance of moderate alcohol use and tobacco use.  Bone density ordered for November.  Refuses mammogram  BMI: discussed patient's BMI and encouraged positive lifestyle modifications to help get to or maintain a target BMI.  HM needs and immunizations were addressed and ordered. See below for orders. See HM and immunization section for updates.  Flu shot today  Routine labs and screening tests ordered including cmp, cbc and lipids where appropriate.  Discussed recommendations regarding Vit D and calcium supplementation (see AVS)  Chronic disease management visit and/or acute problem visit:  Chronic low back pain, DJD, spinal stenosis: Stable per rheumatology  PMR: Reports stable, off prednisone.  Tapered earlier than usual.  Watch for rebound symptoms.  Normal sed rate.  Depression and secondary insomnia: Stable.  Continue Zoloft and trazodone  Osteopenia: On Fosamax for 1 year.  Recheck.  Patient would like to come off Fosamax.  Continue calcium and vitamin D.  To discuss further once results are back.  Check vitamin levels  Hypothyroidism due for recheck.  Chronic cough: Suspect allergies plus minus GERD: Education given.  Start Zyrtec.  Consider Flonase and her PPI.  Follow-up if needed.  Request Tessalon Perles just to have  on hand.  Atopic dermatitis, chronic: Refill betamethasone cream to be used as needed.  Understands risk versus benefits.  Follow up: 6 months for recheck Orders Placed This Encounter  Procedures  . DG Bone Density  . Flu Vaccine QUAD High Dose(Fluad)  . TSH  . Lipid panel  . B12 and Folate Panel  . VITAMIN D 25 Hydroxy (Vit-D Deficiency, Fractures)   Meds ordered this encounter  Medications  . betamethasone dipropionate 0.05 % cream    Sig: APPLY TO AFFECTED AREA TWICE A DAY    Dispense:  45 g    Refill:  0      Lifestyle: Body mass index is 20.3 kg/m. Wt Readings from Last 3 Encounters:  04/27/20 114 lb 9.6 oz (52 kg)  04/19/20 116 lb 6.4 oz (52.8 kg)  01/19/20 114 lb 12.8 oz (52.1 kg)     Patient Active Problem List   Diagnosis Date Noted  . Polymyalgia rheumatica (HCC) 10/23/2019    Priority: High    Aug 2020, Dr. Corliss Skains   . Moderate episode of recurrent major depressive disorder (HCC) 06/22/2013    Priority: High  . 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 Hypothyroidism 12/18/2010    Priority: High  . Spinal stenosis, lumbar region without neurogenic claudication 10/23/2019    Priority: Medium    By lumbar MRI 06/2019, mild   . Spondylosis of cervical region without myelopathy or radiculopathy 05/29/2018    Priority: Medium  . Osteopenia 07/13/2013    Priority: Medium    DEXA 07/2018 T = - 2.3 lowest; mild worsening. Started fosamax due to chronic pred 2021. DEXA 07/2105: T =  -1.2, 06/2013; stable from prior. Continue cal and vit d.    . Dermatitis, atopic 01/30/2013    Priority: Medium    Dr. Koleen Nimrod, severe, failed Cellcept, on Muran Now seeing [facility], 05/2013- started on methotrexate   . 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 Insomnia 03/18/2012    Priority: Medium  . Family history of Parkinson disease 05/21/2016    Priority: Low    Overview:  mom   . Seborrheic dermatitis 11/02/2014      Priority: Low  . Menopausal hot flushes 01/30/2013    Priority: Low    Overview:  Had been on HRT until 11/2012/cla   . Lumbar facet arthropathy 10/23/2019  . 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 DEXA SCAN  07/10/2020  . TETANUS/TDAP  06/20/2023  . INFLUENZA VACCINE  Completed  . COVID-19 Vaccine  Completed  . Hepatitis C Screening  Completed  . PNA vac Low Risk Adult  Completed   Immunization History  Administered Date(s) Administered  . DT (Pediatric) 05/21/2008  . Fluad Quad(high Dose 65+) 04/27/2020  . Influenza Split 06/20/2010, 05/14/2011, 06/18/2012, 06/22/2013, 07/02/2014, 07/05/2015  . Influenza, High Dose Seasonal PF 06/18/2012, 06/18/2012, 06/22/2013, 06/22/2013, 07/02/2014, 07/02/2014, 07/05/2015, 07/05/2015, 05/21/2016, 05/21/2016, 08/02/2017  . Influenza, Seasonal, Injecte, Preservative Fre 05/14/2011  . Influenza,inj,Quad PF,6+ Mos  05/21/2016, 05/29/2018  . Influenza,trivalent, recombinat, inj, PF 05/14/2011  . Influenza-Unspecified 05/14/2011, 06/18/2012, 06/22/2013, 07/02/2014, 07/05/2015  . PFIZER SARS-COV-2 Vaccination 10/11/2019, 11/04/2019  . Pneumococcal Conjugate-13 07/04/2014, 07/04/2014  . Pneumococcal Polysaccharide-23 03/20/2005, 06/22/2011  . Pneumococcal-Unspecified 03/20/2005, 06/22/2011  . Td 05/21/2008  . Tdap 06/22/2011, 06/19/2013   We updated and reviewed the patient's past history in detail and it is documented below. Allergies: Patient has No Known Allergies. Past Medical History Patient  has a past medical history of Anxiety, Cutaneous lupus erythematosus, Family history of Parkinson disease (05/21/2016), GERD (gastroesophageal reflux disease), Kidney failure, Moderate episode of recurrent major depressive disorder (HCC), Osteoporosis, Polymyalgia rheumatica (HCC) (10/23/2019), Restless legs syndrome (RLS) (08/02/2017), Spinal stenosis, lumbar region without neurogenic claudication  (10/23/2019), and Thyroid disease. Past Surgical History Patient  has a past surgical history that includes Total abdominal hysterectomy (1986); Appendectomy (1961); Eye surgery; and Breast surgery (Bilateral, 1987 1998). Family History: Patient family history includes Alcohol abuse in her daughter; Asthma in her mother; Depression in her daughter and mother; Heart disease in her father; Hyperlipidemia in her father; Lupus in her mother; Parkinson's disease in her mother; Stroke in her father. Social History:  Patient  reports that she has never smoked. She has never used smokeless tobacco. She reports current alcohol use. She reports that she does not use drugs.  Review of Systems: Constitutional: negative for fever or malaise Ophthalmic: negative for photophobia, double vision or loss of vision Cardiovascular: negative for chest pain, dyspnea on exertion, or new LE swelling Respiratory: negative for SOB or persistent cough Gastrointestinal: negative for abdominal pain, change in bowel habits or melena Genitourinary: negative for dysuria or gross hematuria, no abnormal uterine bleeding or disharge Musculoskeletal: negative for new gait disturbance or muscular weakness Integumentary: negative for new or persistent rashes, no breast lumps Neurological: negative for TIA or stroke symptoms Psychiatric: negative for SI or delusions Allergic/Immunologic: negative for hives  Patient Care Team    Relationship Specialty Notifications Start End  [name], MD PCP - General Family Medicine  06/11/16   Sedalia Muta, PT Physical Therapist Physical Therapy  03/31/19     Objective  Vitals: BP 108/70   Pulse 75   Temp (!) 97.3 F (36.3 C) (Temporal)   Resp 18   Ht 5\' 3"  (1.6 m)   Wt 114 lb 9.6 oz (52 kg)   SpO2 97%   BMI 20.30 kg/m  General:  Well developed, well nourished, no acute distress, clears throat while in office Psych:  Alert and orientedx3,normal mood and affect HEENT:   Normocephalic, atraumatic, non-icteric sclera, PND noted supple neck without adenopathy, mass or thyromegaly Cardiovascular:  Normal S1, S2, RRR without gallop, rub or murmur Respiratory:  Good breath sounds bilaterally, CTAB with normal respiratory effort Gastrointestinal: normal bowel sounds, soft, non-tender, no noted masses. No HSM MSK: no deformities, contusions. Joints are without erythema or swelling.  Skin:  Warm, no rashes or suspicious lesions noted Neurologic:    Mental status is normal. CN 2-11 are normal. Gross motor and sensory exams are normal. Normal gait. No tremor Breast Exam: Refuses breast exam  Commons side effects, risks, benefits, and alternatives for medications and treatment plan prescribed today were discussed, and the patient expressed understanding of the given instructions. Patient is instructed to call or message via MyChart if he/she has any questions or concerns regarding our treatment plan. No barriers to understanding were identified. We discussed Red Flag symptoms and signs in detail. Patient expressed understanding regarding what  to do in case of urgent or emergency type symptoms.   Medication list was reconciled, printed and provided to the patient in AVS. Patient instructions and summary information was reviewed with the patient as documented in the AVS. This note was prepared with assistance of Dragon voice recognition software. Occasional wrong-word or sound-a-like substitutions may have occurred due to the inherent limitations of voice recognition software  This visit occurred during the SARS-CoV-2 public health emergency.  Safety protocols were in place, including screening questions prior to the visit, additional usage of staff PPE, and extensive cleaning of exam room while observing appropriate contact time as indicated for disinfecting solutions.

## 2020-04-28 LAB — LIPID PANEL
Cholesterol: 195 mg/dL (ref <200)
HDL: 86 mg/dL (ref >=50)
LDL Cholesterol (Calc): 96 mg/dL (calc)
Non-HDL Cholesterol (Calc): 109 mg/dL (calc) (ref <130)
Total CHOL/HDL Ratio: 2.3 (calc) (ref <5.0)
Triglycerides: 45 mg/dL (ref <150)

## 2020-04-28 LAB — TSH: TSH: 0.43 mIU/L (ref 0.40–4.50)

## 2020-04-28 LAB — VITAMIN D 25 HYDROXY (VIT D DEFICIENCY, FRACTURES): Vit D, 25-Hydroxy: 65 ng/mL (ref 30–100)

## 2020-04-28 LAB — B12 AND FOLATE PANEL
Folate: 19.9 ng/mL
Vitamin B-12: 434 pg/mL (ref 200–1100)

## 2020-05-05 ENCOUNTER — Other Ambulatory Visit: Payer: Self-pay | Admitting: Rheumatology

## 2020-05-05 NOTE — Telephone Encounter (Signed)
Last Visit: 04/19/2020 Next Visit: 09/20/2020 Labs: 04/19/2020 CBC, CMP within normal limits  Current Dose per office note 04/19/2020: reduce methotrexate to 3 tablets p.o. weekly DX:  Polymyalgia rheumatica   Okay to refill MTX?

## 2020-05-11 ENCOUNTER — Other Ambulatory Visit: Payer: Self-pay | Admitting: Family Medicine

## 2020-06-17 ENCOUNTER — Ambulatory Visit: Payer: Medicare Other | Admitting: Family Medicine

## 2020-06-17 ENCOUNTER — Other Ambulatory Visit: Payer: Self-pay

## 2020-06-17 ENCOUNTER — Encounter: Payer: Self-pay | Admitting: Family Medicine

## 2020-06-17 VITALS — BP 100/70 | HR 76 | Temp 98.0°F | Wt 115.4 lb

## 2020-06-17 DIAGNOSIS — T887XXA Unspecified adverse effect of drug or medicament, initial encounter: Secondary | ICD-10-CM | POA: Diagnosis not present

## 2020-06-17 DIAGNOSIS — R1013 Epigastric pain: Secondary | ICD-10-CM | POA: Diagnosis not present

## 2020-06-17 DIAGNOSIS — R1031 Right lower quadrant pain: Secondary | ICD-10-CM

## 2020-06-17 MED ORDER — DICLOFENAC SODIUM 75 MG PO TBEC: 75.0000 mg | DELAYED_RELEASE_TABLET | Freq: Two times a day (BID) | ORAL | 0 refills | Status: DC

## 2020-06-17 NOTE — Patient Instructions (Signed)
Please follow up if symptoms do not improve or as needed.   Please go to our [facility] office to get your xrays done. You can walk in M-F between 8:30am- noon or 1pm - 5pm. Tell them you are there for xrays ordered by me. They will send me the results, then I will let you know the results with instructions.   Address: [address]  The Xray department is located in the basement.   Start the diclofenac twice daily with meals for the next 2-3 weeks to see if that will get the pain calmed down.   Hold the fosamax for the next 1-3 months to see if your upper GI symptoms improve. Consider adding otc prilosec daily as well.

## 2020-06-17 NOTE — Progress Notes (Signed)
Subjective  CC:  Chief Complaint  Patient presents with  . Abdominal Pain    lower right quad, pain started around 2 weeks ago  . Gastroesophageal Reflux    HPI: Cheryl Austin is a 79 y.o. female who presents to the office today to address the problems listed above in the chief complaint.  78 year old female complains of 2 to 3-week history of right groin pain. Describes it as aching. Worse with walking or activity but does ache in the middle of the night as well. No injuries. No limp. No bowel or bladder symptoms. No fevers or chills. Has known osteoarthritis of the spine. Has not taken any medications. Also complains of increased belching and some upper GI bloating. She is on Fosamax. No recent GERD symptoms. No epigastric pain or burning.  Reviewed rheumatology notes. Has stopped prednisone and methotrexate. History of PMR. Denies muscle pain.  Recent lab work reviewed. Normal CBC and CMP Assessment  1. Right groin pain   2. Dyspepsia   3. Side effect of medication      Plan   Groin pain: Suspect due to hip etiology, osteoarthritis or other. Check x-rays. Start 2-week course of anti-inflammatory. Follow-up if not improving  Dyspepsia: Hold Fosamax for 1 to 3 months. Consider PPI. Follow-up if worsening. No red flag symptoms noted  Follow up: As scheduled 10/26/2020  Orders Placed This Encounter  Procedures  . DG HIP UNILAT W OR W/O PELVIS 2-3 VIEWS RIGHT   Meds ordered this encounter  Medications  . diclofenac (VOLTAREN) 75 MG EC tablet    Sig: Take 1 tablet (75 mg total) by mouth 2 (two) times daily.    Dispense:  30 tablet    Refill:  0      I reviewed the patients updated PMH, FH, and SocHx.    Patient Active Problem List   Diagnosis Date Noted  . Polymyalgia rheumatica (HCC) 10/23/2019    Priority: High  . Moderate episode of recurrent major depressive disorder (HCC) 06/22/2013    Priority: High  . 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  Hypothyroidism 12/18/2010    Priority: High  . Spinal stenosis, lumbar region without neurogenic claudication 10/23/2019    Priority: Medium  . Spondylosis of cervical region without myelopathy or radiculopathy 05/29/2018    Priority: Medium  . Osteopenia 07/13/2013    Priority: Medium  . Dermatitis, atopic 01/30/2013    Priority: Medium  . 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 Insomnia 03/18/2012    Priority: Medium  . Family history of Parkinson disease 05/21/2016    Priority: Low  . Seborrheic dermatitis 11/02/2014    Priority: Low  . Menopausal hot flushes 01/30/2013    Priority: Low  . Lumbar facet arthropathy 10/23/2019  . 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 alendronate (FOSAMAX) 70 MG tablet TAKE 1 TABLET BY MOUTH ONCE A WEEK WITH A FULL GLASS OF WATER ON AN EMPTY STOMACH  . betamethasone dipropionate 0.05 % cream APPLY TO AFFECTED AREA TWICE A DAY  . folic acid (FOLVITE) 1 MG tablet TAKE 1 TABLET BY MOUTH EVERY DAY  . levothyroxine (SYNTHROID) 50 MCG tablet TAKE 1 TABLET BY MOUTH EVERY DAY  . LORazepam (ATIVAN) 0.5 MG tablet Take 1-2 tablets by mouth daily as needed.  . sertraline (ZOLOFT) 100 MG tablet Take 1 tablet by mouth daily.  . traZODone (DESYREL) 100 MG tablet Take  100 mg by mouth as needed for sleep.    Allergies: Patient has No Known Allergies. Family History: Patient family history includes Alcohol abuse in her daughter; Asthma in her mother; Depression in her daughter and mother; Heart disease in her father; Hyperlipidemia in her father; Lupus in her mother; Parkinson's disease in her mother; Stroke in her father. Social History:  Patient  reports that she has never smoked. She has never used smokeless tobacco. She reports current alcohol use. She reports that she does not use drugs.  Review of Systems: Constitutional: Negative for fever malaise or anorexia Cardiovascular: negative for chest  pain Respiratory: negative for SOB or persistent cough Gastrointestinal: negative for abdominal pain  Objective  Vitals: BP 100/70   Pulse 76   Temp 98 F (36.7 C) (Temporal)   Wt 115 lb 6.4 oz (52.3 kg)   SpO2 98%   BMI 20.44 kg/m  General: no acute distress , A&Ox3 HEENT: PEERL, conjunctiva normal, neck is supple Cardiovascular:  RRR without murmur or gallop.  Respiratory:  Good breath sounds bilaterally, CTAB with normal respiratory effort Gastrointestinal: soft, flat abdomen, normal active bowel sounds, minimal midepigastric tenderness without rebound or guarding no palpable masses, no hepatosplenomegaly, no appreciated hernias, no groin adenopathy Right hip tender to deep palpation. Normal range of motion. Normal gait without limp negative straight leg raise bilaterally Skin:  Warm, no rashes     Commons side effects, risks, benefits, and alternatives for medications and treatment plan prescribed today were discussed, and the patient expressed understanding of the given instructions. Patient is instructed to call or message via MyChart if he/she has any questions or concerns regarding our treatment plan. No barriers to understanding were identified. We discussed Red Flag symptoms and signs in detail. Patient expressed understanding regarding what to do in case of urgent or emergency type symptoms.   Medication list was reconciled, printed and provided to the patient in AVS. Patient instructions and summary information was reviewed with the patient as documented in the AVS. This note was prepared with assistance of Dragon voice recognition software. Occasional wrong-word or sound-a-like substitutions may have occurred due to the inherent limitations of voice recognition software  This visit occurred during the SARS-CoV-2 public health emergency.  Safety protocols were in place, including screening questions prior to the visit, additional usage of staff PPE, and extensive cleaning of  exam room while observing appropriate contact time as indicated for disinfecting solutions.

## 2020-06-22 IMAGING — DX DG CHEST 2V
2 series · 2 of 2 positions shown · non-contrast
Comparison: 07/29/2009

CLINICAL DATA: Chronic cough 3 months.

EXAM:
CHEST - 2 VIEW

[chest pa]
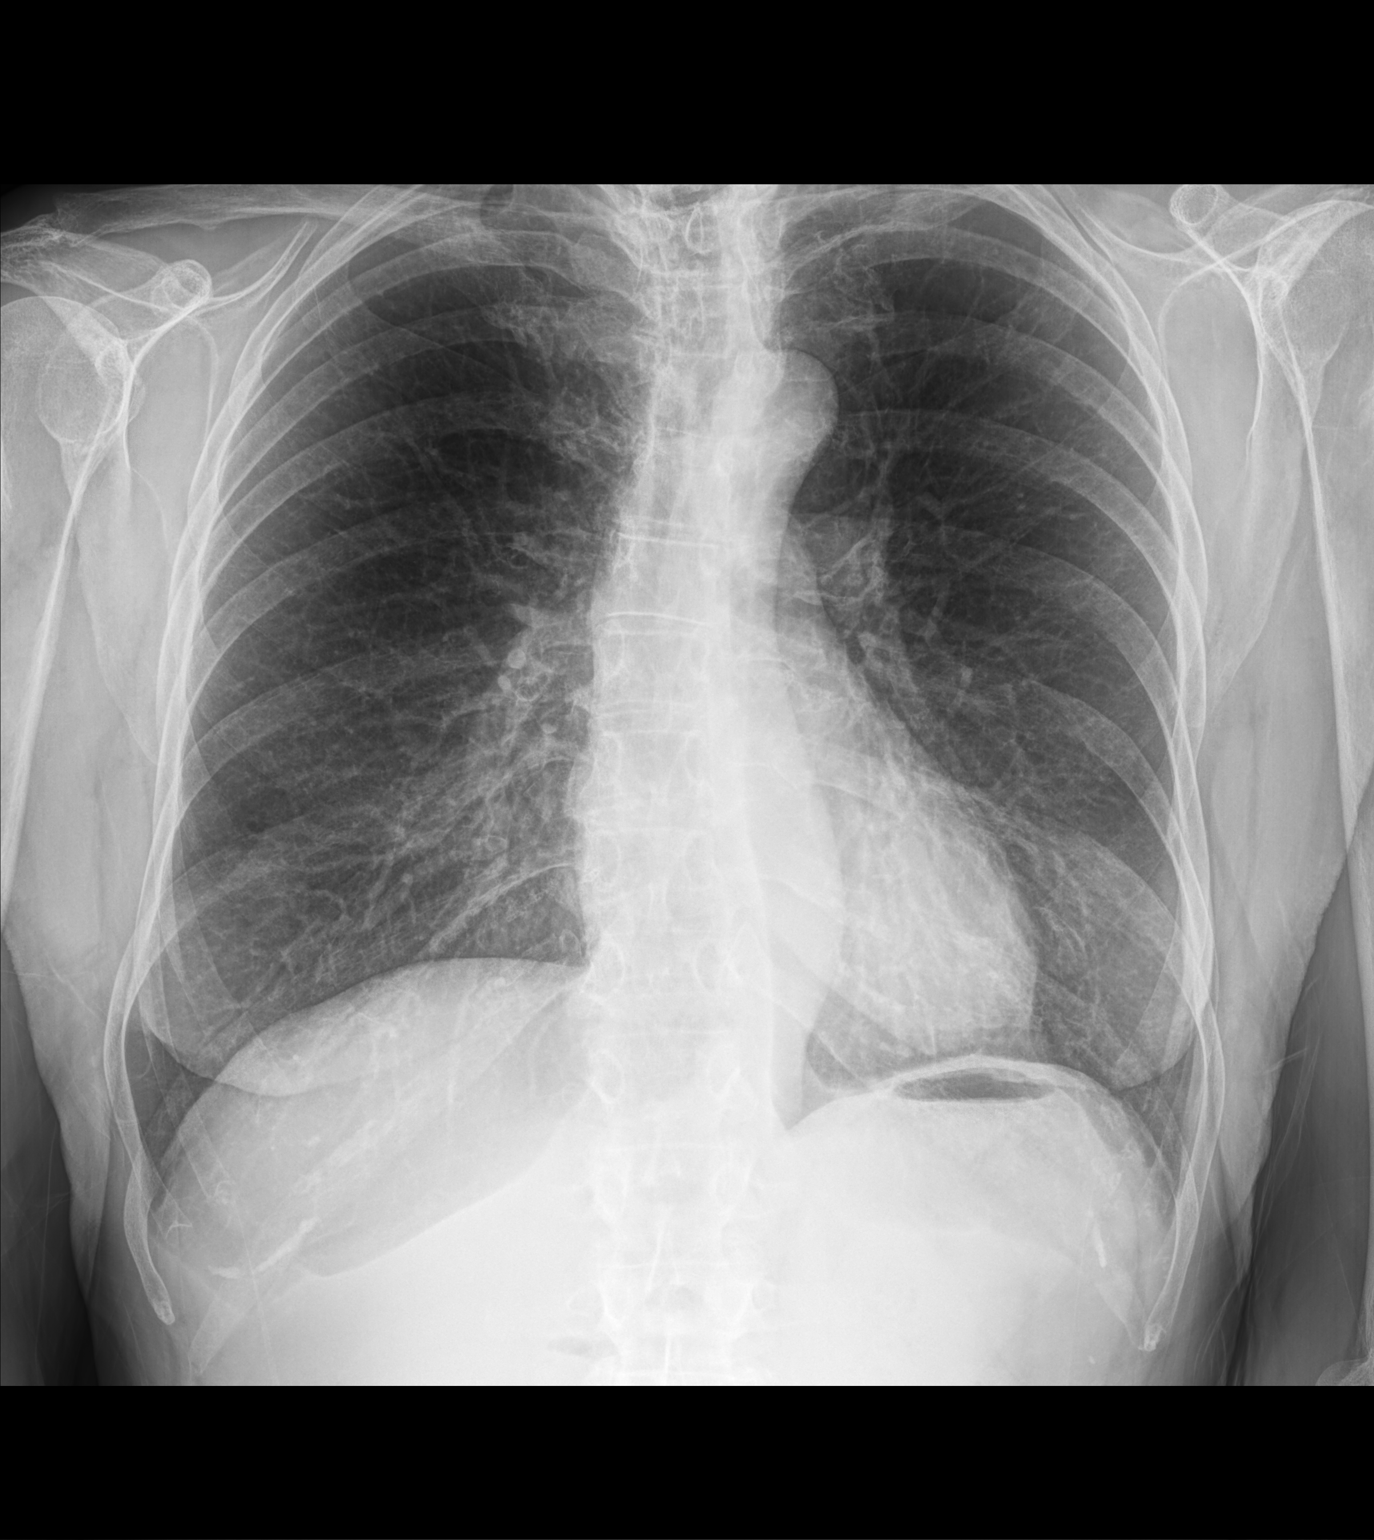

[chest lat]
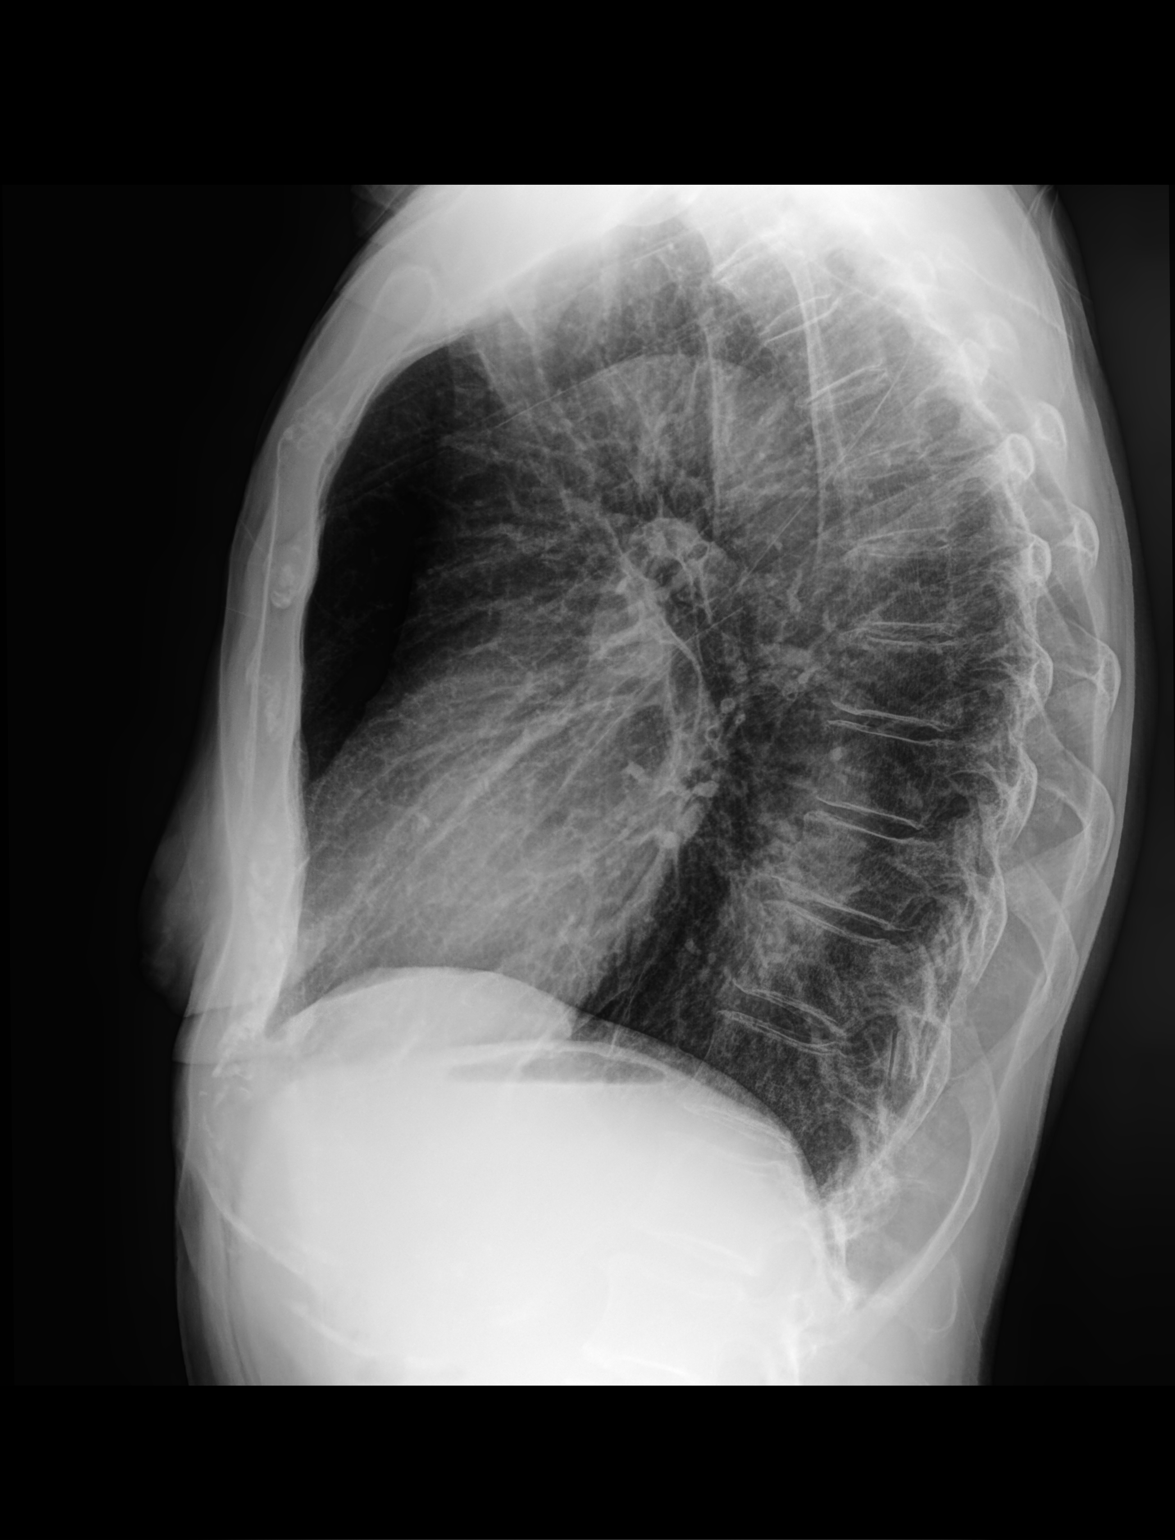

[2 of 2 positions shown; findings below may reference images not displayed]

FINDINGS: Lungs are adequately inflated without focal airspace consolidation
or effusion. Cardiomediastinal silhouette and remainder of the exam
is unchanged.
IMPRESSION: No active cardiopulmonary disease.

## 2020-06-23 ENCOUNTER — Other Ambulatory Visit: Payer: Self-pay | Admitting: Rheumatology

## 2020-06-23 NOTE — Telephone Encounter (Signed)
Last Visit: 04/19/2020 Next Visit: 09/20/2020 Labs: 04/19/2020 CBC, CMP within normal limits  Current Dose per office note 04/19/2020: not discussed.   DX: Osteopenia of multiple sites   Okay to refill Fosamax?

## 2020-07-11 ENCOUNTER — Other Ambulatory Visit: Payer: Self-pay | Admitting: Family Medicine

## 2020-07-11 DIAGNOSIS — Z780 Asymptomatic menopausal state: Secondary | ICD-10-CM

## 2020-07-11 DIAGNOSIS — M8580 Other specified disorders of bone density and structure, unspecified site: Secondary | ICD-10-CM

## 2020-08-29 ENCOUNTER — Other Ambulatory Visit: Payer: Self-pay

## 2020-08-29 ENCOUNTER — Ambulatory Visit: Payer: Medicare Other | Admitting: Physician Assistant

## 2020-08-29 ENCOUNTER — Encounter: Payer: Self-pay | Admitting: Physician Assistant

## 2020-08-29 VITALS — BP 120/90 | HR 70 | Temp 98.1°F | Ht 63.0 in | Wt 116.5 lb

## 2020-08-29 DIAGNOSIS — L03116 Cellulitis of left lower limb: Secondary | ICD-10-CM

## 2020-08-29 MED ORDER — MUPIROCIN 2 % EX OINT: TOPICAL_OINTMENT | CUTANEOUS | 0 refills | Status: DC

## 2020-08-29 MED ORDER — CEPHALEXIN 500 MG PO CAPS: 500.0000 mg | ORAL_CAPSULE | Freq: Four times a day (QID) | ORAL | 0 refills | Status: DC

## 2020-08-29 NOTE — Patient Instructions (Signed)
It was great to see you!  Start keflex antibiotic.  May use topical bactroban as needed.  Come back and see [place] if any concerns.   Cellulitis, Adult  Cellulitis is a skin infection. The infected area is usually warm, red, swollen, and tender. This condition occurs most often in the arms and lower legs. The infection can travel to the muscles, blood, and underlying tissue and become serious. It is very important to get treated for this condition. What are the causes? Cellulitis is caused by bacteria. The bacteria enter through a break in the skin, such as a cut, burn, insect bite, open sore, or crack. What increases the risk? This condition is more likely to occur in people who:  Have a weak body defense system (immune system).  Have open wounds on the skin, such as cuts, burns, bites, and scrapes. Bacteria can enter the body through these open wounds.  Are older than 79 years of age.  Have diabetes.  Have a type of long-lasting (chronic) liver disease (cirrhosis) or kidney disease.  Are obese.  Have a skin condition such as: ? Itchy rash (eczema). ? Slow movement of blood in the veins (venous stasis). ? Fluid buildup below the skin (edema).  Have had radiation therapy.  Use IV drugs. What are the signs or symptoms? Symptoms of this condition include:  Redness, streaking, or spotting on the skin.  Swollen area of the skin.  Tenderness or pain when an area of the skin is touched.  Warm skin.  A fever.  Chills.  Blisters. How is this diagnosed? This condition is diagnosed based on a medical history and physical exam. You may also have tests, including:  Blood tests.  Imaging tests. How is this treated? Treatment for this condition may include:  Medicines, such as antibiotic medicines or medicines to treat allergies (antihistamines).  Supportive care, such as rest and application of cold or warm cloths (compresses) to the skin.  Hospital care, if the  condition is severe. The infection usually starts to get better within 1-2 days of treatment. Follow these instructions at home: Medicines  Take over-the-counter and prescription medicines only as told by your health care provider.  If you were prescribed an antibiotic medicine, take it as told by your health care provider. Do not stop taking the antibiotic even if you start to feel better. General instructions  Drink enough fluid to keep your urine pale yellow.  Do not touch or rub the infected area.  Raise (elevate) the infected area above the level of your heart while you are sitting or lying down.  Apply warm or cold compresses to the affected area as told by your health care provider.  Keep all follow-up visits as told by your health care provider. This is important. These visits let your health care provider make sure a more serious infection is not developing.   Contact a health care provider if:  You have a fever.  Your symptoms do not begin to improve within 1-2 days of starting treatment.  Your bone or joint underneath the infected area becomes painful after the skin has healed.  Your infection returns in the same area or another area.  You notice a swollen bump in the infected area.  You develop new symptoms.  You have a general ill feeling (malaise) with muscle aches and pains. Get help right away if:  Your symptoms get worse.  You feel very sleepy.  You develop vomiting or diarrhea that persists.  You  notice red streaks coming from the infected area.  Your red area gets larger or turns dark in color. These symptoms may represent a serious problem that is an emergency. Do not wait to see if the symptoms will go away. Get medical help right away. Call your local emergency services (911 in the [place] Do not drive yourself to the hospital. Summary  Cellulitis is a skin infection. This condition occurs most often in the arms and lower legs.  Treatment for  this condition may include medicines, such as antibiotic medicines or antihistamines.  Take over-the-counter and prescription medicines only as told by your health care provider. If you were prescribed an antibiotic medicine, do not stop taking the antibiotic even if you start to feel better.  Contact a health care provider if your symptoms do not begin to improve within 1-2 days of starting treatment or your symptoms get worse.  Keep all follow-up visits as told by your health care provider. This is important. These visits let your health care provider make sure that a more serious infection is not developing. This information is not intended to replace advice given to you by your health care provider. Make sure you discuss any questions you have with your health care provider. Document Revised: 08/17/2019 Document Reviewed: 12/26/2017 Elsevier Patient Education  2021 [employer].

## 2020-08-29 NOTE — Progress Notes (Signed)
Cheryl Austin is a 80 y.o. female here for a new problem.  I acted as a Neurosurgeon for [employer], PA-C Corky Mull, LPN   History of Present Illness:   Chief Complaint  Patient presents with  . cyst    HPI   Cyst First noticed this Wed/Thurs last week. Was scratching her leg due to dry skin. Pt c/o cyst on left lower leg, center has a dark area, red and swollen. She is having pain but not taking anything. She has been putting on expired Mupirocin BID on this area.   Past Medical History:  Diagnosis Date  . Anxiety   . 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 Family history of Parkinson disease 05/21/2016   Mother  . GERD (gastroesophageal reflux disease)   . Kidney failure    blood transfusion  . Moderate episode of recurrent major depressive disorder (HCC)   . Osteoporosis   . Polymyalgia rheumatica (HCC) 10/23/2019   Aug 2020, Dr. Corliss Skains  . Restless legs syndrome (RLS) 08/02/2017  . 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 Thyroid disease    Hypothyroid     Social History   Tobacco Use  . Smoking status: Never Smoker  . Smokeless tobacco: Never Used  Vaping Use  . Vaping Use: Never used  Substance Use Topics  . Alcohol use: Yes    Comment: couple of times weekly (1 drink)  . Drug use: No    Past Surgical History:  Procedure Laterality Date  . APPENDECTOMY  1961  . BREAST SURGERY Bilateral 1987 1998   Lumpectomy  . EYE SURGERY    . TOTAL ABDOMINAL HYSTERECTOMY  1986   BSO/Fibroids    Family History  Problem Relation Age of Onset  . Lupus Mother   . Asthma Mother   . Parkinson's disease Mother   . Depression Mother   . Heart disease Father   . Hyperlipidemia Father   . Stroke Father   . Alcohol abuse Daughter   . Depression Daughter     No Known Allergies  Current Medications:   Current Outpatient Medications:  .   betamethasone dipropionate 0.05 % cream, APPLY TO AFFECTED AREA TWICE A DAY, Disp: 45 g, Rfl: 0 .  cephALEXin (KEFLEX) 500 MG capsule, Take 1 capsule (500 mg total) by mouth 4 (four) times daily., Disp: 28 capsule, Rfl: 0 .  LORazepam (ATIVAN) 0.5 MG tablet, Take 1-2 tablets by mouth daily as needed., Disp: 30 tablet, Rfl: 2 .  mupirocin ointment (BACTROBAN) 2 %, Apply to affected area 1-2 times daily, Disp: 22 g, Rfl: 0 .  sertraline (ZOLOFT) 100 MG tablet, Take 1 tablet by mouth daily., Disp: , Rfl:  .  traZODone (DESYREL) 100 MG tablet, Take 100 mg by mouth as needed for sleep., Disp: , Rfl:    Review of Systems:   ROS  Negative unless otherwise specified per HPI.  Vitals:   Vitals:   08/29/20 1521  BP: 120/90  Pulse: 70  Temp: 98.1 F (36.7 C)  TempSrc: Temporal  SpO2: 94%  Weight: 116 lb 8 oz (52.8 kg)  Height: 5\' 3"  (1.6 m)     Body mass index is 20.64 kg/m.  Physical Exam:   Physical Exam Vitals and nursing note reviewed.  Constitutional:      General: She is not in acute distress.    Appearance: She is well-developed.  She is not ill-appearing, toxic-appearing or sickly-appearing.  Cardiovascular:     Rate and Rhythm: Normal rate and regular rhythm.     Pulses: Normal pulses.     Heart sounds: Normal heart sounds, S1 normal and S2 normal.     Comments: No LE edema Pulmonary:     Effort: Pulmonary effort is normal.     Breath sounds: Normal breath sounds.  Skin:    General: Skin is warm, dry and intact.     Comments: Area of 3cm x 4cm eythema with slightly more significantly reddened area to center on lower L shin; TTP, slight warmth  Neurological:     Mental Status: She is alert.     GCS: GCS eye subscore is 4. GCS verbal subscore is 5. GCS motor subscore is 6.  Psychiatric:        Mood and Affect: Mood and affect normal.        Speech: Speech normal.        Behavior: Behavior normal. Behavior is cooperative.      Assessment and Plan:   Cheryl Austin was  seen today for cyst.  Diagnoses and all orders for this visit:  Cellulitis of left lower extremity No red flags on exam. Start keflex antibiotic for symptoms. Refilled bactroban for future use prn. Follow-up if symptoms worsen or persist despite treatment.   Other orders -     cephALEXin (KEFLEX) 500 MG capsule; Take 1 capsule (500 mg total) by mouth 4 (four) times daily. -     mupirocin ointment (BACTROBAN) 2 %; Apply to affected area 1-2 times daily  CMA or LPN served as scribe during this visit. History, Physical, and Plan performed by medical provider. The above documentation has been reviewed and is accurate and complete.  Jarold Motto, PA-C

## 2020-09-01 ENCOUNTER — Ambulatory Visit: Payer: Medicare Other | Admitting: Family Medicine

## 2020-09-01 ENCOUNTER — Other Ambulatory Visit: Payer: Self-pay

## 2020-09-01 ENCOUNTER — Encounter: Payer: Self-pay | Admitting: Family Medicine

## 2020-09-01 VITALS — BP 116/78 | HR 72 | Temp 98.3°F | Wt 115.2 lb

## 2020-09-01 DIAGNOSIS — L03116 Cellulitis of left lower limb: Secondary | ICD-10-CM | POA: Diagnosis not present

## 2020-09-01 DIAGNOSIS — L209 Atopic dermatitis, unspecified: Secondary | ICD-10-CM

## 2020-09-01 DIAGNOSIS — M48061 Spinal stenosis, lumbar region without neurogenic claudication: Secondary | ICD-10-CM

## 2020-09-01 DIAGNOSIS — Z8739 Personal history of other diseases of the musculoskeletal system and connective tissue: Secondary | ICD-10-CM

## 2020-09-01 DIAGNOSIS — M47816 Spondylosis without myelopathy or radiculopathy, lumbar region: Secondary | ICD-10-CM | POA: Diagnosis not present

## 2020-09-01 MED ORDER — DOXYCYCLINE HYCLATE 100 MG PO TABS: 100.0000 mg | ORAL_TABLET | Freq: Two times a day (BID) | ORAL | 0 refills | Status: AC

## 2020-09-01 NOTE — Progress Notes (Signed)
Subjective  CC:  Chief Complaint  Patient presents with  . Cellulitis    Left leg, did see Samantha on 08/29/20, still taking Keflex and using Bactroban ointment, is concerned because she has not noticed any improvement     HPI: Cheryl Austin is a 80 y.o. female who presents to the office today to address the problems listed above in the chief complaint.  I reviewed OV from 1/10 for LE cellulitis.  Patient with long history of severe atopic dermatitis managed by dermatology and failed most conservative measures reports she noticed a patch of eczema-like changes in her left shin last week.  It was flaking and itching.  She scratched and then noted increasing redness and swelling.  She was seen here on the 10th and diagnosed with cellulitis and treated with Keflex.  She also has a history of recurrent MRSA cellulitis.  Since, or redness in the morning is decreased however she reports increased swelling, pain in the late afternoons.  She denies fevers or systemic symptoms.  No drainage.  Low back pain with history of spinal stenosis and facet arthropathy: Overall doing very well but has intermittent pain, worse with increased activity.  She has questions about what medication she can take.  In the past she has used meloxicam and Ultram.  No radicular symptoms.  No daily pain.  PMR: Remains off prednisone and asymptomatic.  Assessment  1. Cellulitis of left lower extremity   2. Atopic dermatitis, unspecified type   3. Lumbar facet arthropathy   4. Spinal stenosis, lumbar region without neurogenic claudication   5. History of polymyalgia rheumatica      Plan   Cellulitis, secondary, atopic dermatitis active: Slight improvement but given pain, change to Doxy to cover for MRSA.  Recommend elevating the leg.  Discussed wound care.  No systemic symptoms.  Follow-up if not improving.  Low back pain: No worsening.  Educated on using anti-inflammatory like meloxicam as needed.  Can use tramadol  if moderate to severe pain develop.  PMR: Remains stable off prednisone.  Monitor for recurrence.  Follow up: As scheduled 10/26/2020  No orders of the defined types were placed in this encounter.  Meds ordered this encounter  Medications  . doxycycline (VIBRA-TABS) 100 MG tablet    Sig: Take 1 tablet (100 mg total) by mouth 2 (two) times daily for 7 days.    Dispense:  14 tablet    Refill:  0      I reviewed the patients updated PMH, FH, and SocHx.    Patient Active Problem List   Diagnosis Date Noted  . Polymyalgia rheumatica (HCC) 10/23/2019    Priority: High  . Moderate episode of recurrent major depressive disorder (HCC) 06/22/2013    Priority: High  . 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 Hypothyroidism 12/18/2010    Priority: High  . Spinal stenosis, lumbar region without neurogenic claudication 10/23/2019    Priority: Medium  . Spondylosis of cervical region without myelopathy or radiculopathy 05/29/2018    Priority: Medium  . Osteopenia 07/13/2013    Priority: Medium  . Dermatitis, atopic 01/30/2013    Priority: Medium  . 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 Insomnia 03/18/2012    Priority: Medium  . Family history of Parkinson disease 05/21/2016    Priority: Low  . Seborrheic dermatitis 11/02/2014    Priority: Low  . Menopausal hot flushes 01/30/2013    Priority: Low  . Lumbar facet  arthropathy 10/23/2019  . 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 betamethasone dipropionate 0.05 % cream APPLY TO AFFECTED AREA TWICE A DAY  . doxycycline (VIBRA-TABS) 100 MG tablet Take 1 tablet (100 mg total) by mouth 2 (two) times daily for 7 days.  Marland Kitchen LORazepam (ATIVAN) 0.5 MG tablet Take 1-2 tablets by mouth daily as needed.  . mupirocin ointment (BACTROBAN) 2 % Apply to affected area 1-2 times daily  . sertraline (ZOLOFT) 100 MG tablet Take 1 tablet by mouth daily.  . traZODone (DESYREL) 100 MG  tablet Take 100 mg by mouth as needed for sleep.  . [DISCONTINUED] cephALEXin (KEFLEX) 500 MG capsule Take 1 capsule (500 mg total) by mouth 4 (four) times daily.    Allergies: Patient has No Known Allergies. Family History: Patient family history includes Alcohol abuse in her daughter; Asthma in her mother; Depression in her daughter and mother; Heart disease in her father; Hyperlipidemia in her father; Lupus in her mother; Parkinson's disease in her mother; Stroke in her father. Social History:  Patient  reports that she has never smoked. She has never used smokeless tobacco. She reports current alcohol use. She reports that she does not use drugs.  Review of Systems: Constitutional: Negative for fever malaise or anorexia Cardiovascular: negative for chest pain Respiratory: negative for SOB or persistent cough Gastrointestinal: negative for abdominal pain  Objective  Vitals: BP 116/78   Pulse 72   Temp 98.3 F (36.8 C) (Temporal)   Wt 115 lb 3.2 oz (52.3 kg)   SpO2 98%   BMI 20.41 kg/m  General: no acute distress , A&Ox3 Normal gait, moves to table unassisted Skin:  Warm, left anterior shin with patch of mild erythema, flaking and central scabbing.  No significant warmth.  Tender to touch without fluctuance.  Nonpitting edema to the midshin.     Commons side effects, risks, benefits, and alternatives for medications and treatment plan prescribed today were discussed, and the patient expressed understanding of the given instructions. Patient is instructed to call or message via MyChart if he/she has any questions or concerns regarding our treatment plan. No barriers to understanding were identified. We discussed Red Flag symptoms and signs in detail. Patient expressed understanding regarding what to do in case of urgent or emergency type symptoms.   Medication list was reconciled, printed and provided to the patient in AVS. Patient instructions and summary information was reviewed  with the patient as documented in the AVS. This note was prepared with assistance of Dragon voice recognition software. Occasional wrong-word or sound-a-like substitutions may have occurred due to the inherent limitations of voice recognition software  This visit occurred during the SARS-CoV-2 public health emergency.  Safety protocols were in place, including screening questions prior to the visit, additional usage of staff PPE, and extensive cleaning of exam room while observing appropriate contact time as indicated for disinfecting solutions.

## 2020-09-01 NOTE — Patient Instructions (Signed)
Please follow up as scheduled for your next visit with me: 10/26/2020   You may stop the cephalexin and switch to doxy. Stop the bactroban ointment on the skin. You may use vaseline over the scab and keep it covered during the day.  Elevate your leg!  If you have any questions or concerns, please don't hesitate to send me a message via MyChart or call the office at [phone]. Thank you for visiting with [place] today! It's our pleasure caring for you.

## 2020-09-06 NOTE — Progress Notes (Deleted)
Office Visit Note  Patient: Cheryl Austin             Date of Birth: 1940-08-31           MRN: [id number]             PCP: [name], MD Referring: [name], MD Visit Date: 09/20/2020 Occupation: @GUAROCC @  Subjective:  No chief complaint on file.   History of Present Illness: Cheryl Austin is a 80 y.o. female ***   Activities of Daily Living:  Patient reports morning stiffness for *** {minute/hour:19697}.   Patient {ACTIONS;DENIES/REPORTS:21021675::"Denies"} nocturnal pain.  Difficulty dressing/grooming: {ACTIONS;DENIES/REPORTS:21021675::"Denies"} Difficulty climbing stairs: {ACTIONS;DENIES/REPORTS:21021675::"Denies"} Difficulty getting out of chair: {ACTIONS;DENIES/REPORTS:21021675::"Denies"} Difficulty using hands for taps, buttons, cutlery, and/or writing: {ACTIONS;DENIES/REPORTS:21021675::"Denies"}  No Rheumatology ROS completed.   PMFS History:  Patient Active Problem List   Diagnosis Date Noted  . Polymyalgia rheumatica (HCC) 10/23/2019  . Spinal stenosis, lumbar region without neurogenic claudication 10/23/2019  . Lumbar facet arthropathy 10/23/2019  . 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 Spondylosis of cervical region without myelopathy or radiculopathy 05/29/2018  . Family history of Parkinson disease 05/21/2016  . Seborrheic dermatitis 11/02/2014  . Osteopenia 07/13/2013  . Moderate episode of recurrent major depressive disorder (HCC) 06/22/2013  . Dermatitis, atopic 01/30/2013  . Menopausal hot flushes 01/30/2013  . MRSA cellulitis 01/30/2013  . 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 Insomnia 03/18/2012  . Hypothyroidism 12/18/2010    Past Medical History:  Diagnosis Date  . Anxiety   . 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 Family history of Parkinson disease 05/21/2016   Mother  . GERD (gastroesophageal reflux disease)   . Kidney failure    blood transfusion  . Moderate  episode of recurrent major depressive disorder (HCC)   . Osteoporosis   . Polymyalgia rheumatica (HCC) 10/23/2019   Aug 2020, Dr. 10-18-1977  . Restless legs syndrome (RLS) 08/02/2017  . 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 Thyroid disease    Hypothyroid    Family History  Problem Relation Age of Onset  . Lupus Mother   . Asthma Mother   . Parkinson's disease Mother   . Depression Mother   . Heart disease Father   . Hyperlipidemia Father   . Stroke Father   . Alcohol abuse Daughter   . Depression Daughter    Past Surgical History:  Procedure Laterality Date  . APPENDECTOMY  1961  . BREAST SURGERY Bilateral 1987 1998   Lumpectomy  . EYE SURGERY    . TOTAL ABDOMINAL HYSTERECTOMY  1986   BSO/Fibroids   Social History   Social History Narrative   Drinks 2-3 caffeine drinks a day    Immunization History  Administered Date(s) Administered  . DT (Pediatric) 05/21/2008  . Fluad Quad(high Dose 65+) 04/27/2020  . Influenza Split 06/20/2010, 05/14/2011, 06/18/2012, 06/22/2013, 07/02/2014, 07/05/2015  . Influenza, High Dose Seasonal PF 06/18/2012, 06/18/2012, 06/22/2013, 06/22/2013, 07/02/2014, 07/02/2014, 07/05/2015, 07/05/2015, 05/21/2016, 05/21/2016, 08/02/2017  . Influenza, Seasonal, Injecte, Preservative Fre 05/14/2011  . Influenza,inj,Quad PF,6+ Mos 05/21/2016, 05/29/2018  . Influenza,trivalent, recombinat, inj, PF 05/14/2011  . Influenza-Unspecified 05/14/2011, 06/18/2012, 06/22/2013, 07/02/2014, 07/05/2015  . PFIZER(Purple Top)SARS-COV-2 Vaccination 10/11/2019, 11/04/2019  . Pneumococcal Conjugate-13 07/04/2014, 07/04/2014  . Pneumococcal Polysaccharide-23 03/20/2005, 06/22/2011  . Pneumococcal-Unspecified 03/20/2005, 06/22/2011  . Td 05/21/2008  . Tdap 06/22/2011, 06/19/2013     Objective: Vital Signs: There were  no vitals taken for this visit.   Physical Exam   Musculoskeletal Exam: ***  CDAI  Exam: CDAI Score: -- Patient Global: --; Provider Global: -- Swollen: --; Tender: -- Joint Exam 09/20/2020   No joint exam has been documented for this visit   There is currently no information documented on the homunculus. Go to the Rheumatology activity and complete the homunculus joint exam.  Investigation: No additional findings.  Imaging: No results found.  Recent Labs: Lab Results  Component Value Date   WBC 4.3 04/19/2020   HGB 12.7 04/19/2020   PLT 239 04/19/2020   NA 135 04/19/2020   K 4.3 04/19/2020   CL 100 04/19/2020   CO2 27 04/19/2020   GLUCOSE 83 04/19/2020   BUN 12 04/19/2020   CREATININE 0.76 04/19/2020   BILITOT 0.7 04/19/2020   ALKPHOS 91 05/14/2019   AST 33 04/19/2020   ALT 25 04/19/2020   PROT 6.2 04/19/2020   ALBUMIN 4.3 05/14/2019   CALCIUM 9.3 04/19/2020   GFRAA 88 04/19/2020   QFTBGOLDPLUS NEGATIVE 07/06/2019    Speciality Comments: No specialty comments available.  Procedures:  No procedures performed Allergies: Patient has no known allergies.   Assessment / Plan:     Visit Diagnoses: No diagnosis found.  Orders: No orders of the defined types were placed in this encounter.  No orders of the defined types were placed in this encounter.   Face-to-face time spent with patient was *** minutes. Greater than 50% of time was spent in counseling and coordination of care.  Follow-Up Instructions: No follow-ups on file.   Ellen Henri, CMA  Note - This record has been created using Animal nutritionist.  Chart creation errors have been sought, but may not always  have been located. Such creation errors do not reflect on  the standard of medical care.

## 2020-09-09 ENCOUNTER — Encounter: Payer: Self-pay | Admitting: Family Medicine

## 2020-09-09 ENCOUNTER — Other Ambulatory Visit: Payer: Self-pay

## 2020-09-09 ENCOUNTER — Ambulatory Visit (INDEPENDENT_AMBULATORY_CARE_PROVIDER_SITE_OTHER): Payer: Medicare Other | Admitting: Family Medicine

## 2020-09-09 DIAGNOSIS — G8929 Other chronic pain: Secondary | ICD-10-CM

## 2020-09-09 DIAGNOSIS — M545 Low back pain, unspecified: Secondary | ICD-10-CM

## 2020-09-09 MED ORDER — MELOXICAM 15 MG PO TABS
7.5000 mg | ORAL_TABLET | Freq: Every day | ORAL | 6 refills | Status: DC | PRN
Start: 2020-09-09 — End: 2020-10-07

## 2020-09-09 NOTE — Progress Notes (Signed)
Office Visit Note   Patient: Cheryl Austin           Date of Birth: Oct 27, 1940           MRN: [id number] Visit Date: 09/09/2020 Requested by: [name], MD [address] [place],  [place] [address] PCP: [name], MD  Subjective: Chief Complaint  Patient presents with   Lower Back - Pain    HPI: She is here with low back pain.  Left-sided lower back pain intermittently over the past several years.  She had an MRI scan last year which showed spondylosis, no compressive lesions.  She has been seeing Dr. Mardene Celeste, her cousin, chiropractic treatments with good results.  She is discouraged that the pain keeps coming back.  No radicular symptoms.  She recently had a flareup and started taking meloxicam and tramadol which have helped.  She is planning to go to [facility] to get stem cell treatment both for her back pain and for improvement in immune function.                ROS:   All other systems were reviewed and are negative.  Objective: Vital Signs: There were no vitals taken for this visit.  Physical Exam:  General:  Alert and oriented, in no acute distress. Pulm:  Breathing unlabored. Psy:  Normal mood, congruent affect. Skin: No visible rash Low back: She is tender primarily along the quadratus lumborum muscle.  No significant midline tenderness, no pain over the SI joint or in the sciatic notch.  Straight leg raise negative, no pain with internal hip rotation and her range of motion is good.  Lower extremity strength and reflexes are normal.  Imaging: No results found.  Assessment & Plan: 1. chronic low back pain with spondylosis, exam today suggests a component of myofascial pain. -Referral to physical therapy for myofascial release techniques followed by home exercises for core strengthening. -Refilled meloxicam to take as needed.  I will see her back as needed.     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   Spondylosis of cervical region without myelopathy or radiculopathy 05/29/2018   Family history of Parkinson disease 05/21/2016   Seborrheic dermatitis 11/02/2014   Osteopenia 07/13/2013   Moderate episode of recurrent major depressive disorder (HCC) 06/22/2013   Dermatitis, atopic 01/30/2013   Menopausal hot flushes 01/30/2013   MRSA cellulitis 01/30/2013   Osteoarthritis, 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 Corliss Skains   Restless legs syndrome (RLS) 08/02/2017   Spinal stenosis, lumbar region without neurogenic claudication 10/23/2019   By lumbar MRI 06/2019, mild   Thyroid disease    Hypothyroid    Family History  Problem Relation Age of Onset   Lupus Mother  Asthma Mother    Parkinson's disease Mother    Depression Mother    Heart disease Father    Hyperlipidemia Father    Stroke Father    Alcohol abuse Daughter    Depression Daughter     Past Surgical History:  Procedure Laterality Date   APPENDECTOMY  1961   BREAST SURGERY Bilateral 1987 1998   Lumpectomy   EYE SURGERY     TOTAL ABDOMINAL HYSTERECTOMY  1986   BSO/Fibroids   Social History   Occupational History   Occupation: Retired  Tobacco Use   Smoking status: Never Smoker   Smokeless tobacco:  Never Used  Building services engineer Use: Never used  Substance and Sexual Activity   Alcohol use: Yes    Comment: couple of times weekly (1 drink)   Drug use: No   Sexual activity: Never    Partners: Male    Birth control/protection: Post-menopausal

## 2020-09-20 ENCOUNTER — Ambulatory Visit: Payer: Medicare Other | Admitting: Family Medicine

## 2020-09-20 ENCOUNTER — Encounter: Payer: Self-pay | Admitting: Family Medicine

## 2020-09-20 ENCOUNTER — Other Ambulatory Visit: Payer: Self-pay

## 2020-09-20 ENCOUNTER — Ambulatory Visit: Payer: Medicare Other | Admitting: Rheumatology

## 2020-09-20 VITALS — BP 122/80 | HR 78 | Temp 96.4°F | Wt 115.6 lb

## 2020-09-20 DIAGNOSIS — M47816 Spondylosis without myelopathy or radiculopathy, lumbar region: Secondary | ICD-10-CM

## 2020-09-20 DIAGNOSIS — T43205A Adverse effect of unspecified antidepressants, initial encounter: Secondary | ICD-10-CM

## 2020-09-20 DIAGNOSIS — Z820 Family history of epilepsy and other diseases of the nervous system: Secondary | ICD-10-CM

## 2020-09-20 DIAGNOSIS — Z8639 Personal history of other endocrine, nutritional and metabolic disease: Secondary | ICD-10-CM

## 2020-09-20 DIAGNOSIS — E559 Vitamin D deficiency, unspecified: Secondary | ICD-10-CM

## 2020-09-20 DIAGNOSIS — M19041 Primary osteoarthritis, right hand: Secondary | ICD-10-CM

## 2020-09-20 DIAGNOSIS — H9113 Presbycusis, bilateral: Secondary | ICD-10-CM

## 2020-09-20 DIAGNOSIS — Z79899 Other long term (current) drug therapy: Secondary | ICD-10-CM

## 2020-09-20 DIAGNOSIS — Z8614 Personal history of Methicillin resistant Staphylococcus aureus infection: Secondary | ICD-10-CM

## 2020-09-20 DIAGNOSIS — G4709 Other insomnia: Secondary | ICD-10-CM

## 2020-09-20 DIAGNOSIS — Z8269 Family history of other diseases of the musculoskeletal system and connective tissue: Secondary | ICD-10-CM

## 2020-09-20 DIAGNOSIS — L219 Seborrheic dermatitis, unspecified: Secondary | ICD-10-CM

## 2020-09-20 DIAGNOSIS — M47812 Spondylosis without myelopathy or radiculopathy, cervical region: Secondary | ICD-10-CM

## 2020-09-20 DIAGNOSIS — Z8659 Personal history of other mental and behavioral disorders: Secondary | ICD-10-CM

## 2020-09-20 DIAGNOSIS — M353 Polymyalgia rheumatica: Secondary | ICD-10-CM

## 2020-09-20 DIAGNOSIS — R42 Dizziness and giddiness: Secondary | ICD-10-CM | POA: Diagnosis not present

## 2020-09-20 DIAGNOSIS — H9313 Tinnitus, bilateral: Secondary | ICD-10-CM | POA: Diagnosis not present

## 2020-09-20 DIAGNOSIS — M8589 Other specified disorders of bone density and structure, multiple sites: Secondary | ICD-10-CM

## 2020-09-20 MED ORDER — SERTRALINE HCL 100 MG PO TABS
50.0000 mg | ORAL_TABLET | Freq: Every day | ORAL | Status: DC
Start: 2020-09-20 — End: 2021-09-13

## 2020-09-20 NOTE — Progress Notes (Signed)
Subjective  CC:  Chief Complaint  Patient presents with  . Tinnitus    Bilateral, experiencing over the last month, worsening over the past week  . Dizziness    Causing patient to have to sit down during these episodes, recently bought home BP cuff - BP is 140's/80's but then drops to 120's/80's     HPI: Cheryl Austin is a 80 y.o. female who presents to the office today to address the problems listed above in the chief complaint.  Cheryl Austin complains of 1 to 2-week history of vertigo, lightheadedness, nausea, buzzing in the head, ringing in the ears, and sweats.  Symptoms come and go but will worsen over the weekend.  Today she is feeling slightly better.  Typically she runs a low blood pressure but blood pressures have been elevated when she was not feeling well.  She denies double vision, severe headaches, or other neurologic symptoms.  Interview reveals that she stopped her sertraline about 2 weeks ago.  She cut it in half, 100 mg to 50 mg for 2 to 3 days and then stopped.  She is just trying to get off medications.  She denies vomiting, abdominal pain, fevers, rash, urinary symptoms, chest pain or shortness of breath.  Chronic low back pain: She has seen orthopedics and chiropractor.  She is inquiring about stem cell injections.  A family member received this treatment from [place].  She is interested in traveling there or elsewhere to receive them.  She prefers not to take pain medications.  No new neurologic symptoms  Assessment  1. 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 Tinnitus of both ears   3. Vertigo   4. Lumbar facet arthropathy   5. Presbycusis of both ears      Plan   SSRI discontinuation/withdrawal symptoms: Educated.  Recommend restarting sertraline at 50 mg daily.  Continue this for at least 4 weeks.  We then can consider slowly weaning, however I do feel that it has been beneficial for her chronic anxiety, stress reaction and dealing  with her chronic back pain.  Chronic low back pain managed by Ortho and chiropractor.  Discussed stem cell injections.  Failed hearing screen: Tinnitus likely in part secondary to hearing loss.  Recommend audiometry  Follow up: If not improving 10/26/2020  No orders of the defined types were placed in this encounter.  No orders of the defined types were placed in this encounter.     I reviewed the patients updated PMH, FH, and SocHx.    Patient Active Problem List   Diagnosis Date Noted  . Polymyalgia rheumatica (HCC) 10/23/2019    Priority: High  . Moderate episode of recurrent major depressive disorder (HCC) 06/22/2013    Priority: High  . 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 Hypothyroidism 12/18/2010    Priority: High  . Spinal stenosis, lumbar region without neurogenic claudication 10/23/2019    Priority: Medium  . Spondylosis of cervical region without myelopathy or radiculopathy 05/29/2018    Priority: Medium  . Osteopenia 07/13/2013    Priority: Medium  . Dermatitis, atopic 01/30/2013    Priority: Medium  . 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 Insomnia 03/18/2012    Priority: Medium  . Family history of Parkinson disease 05/21/2016    Priority: Low  . Seborrheic dermatitis 11/02/2014    Priority: Low  . Menopausal hot flushes 01/30/2013    Priority: Low  . Lumbar facet arthropathy  10/23/2019  . 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 betamethasone dipropionate 0.05 % cream APPLY TO AFFECTED AREA TWICE A DAY  . LORazepam (ATIVAN) 0.5 MG tablet Take 1-2 tablets by mouth daily as needed.  . meloxicam (MOBIC) 15 MG tablet Take 0.5-1 tablets (7.5-15 mg total) by mouth daily as needed for pain.  . mupirocin ointment (BACTROBAN) 2 % Apply to affected area 1-2 times daily  . traMADol (ULTRAM) 50 MG tablet Take by mouth every 6 (six) hours as needed.  . traZODone (DESYREL) 100 MG tablet Take 100  mg by mouth as needed for sleep.    Allergies: Patient has No Known Allergies. Family History: Patient family history includes Alcohol abuse in her daughter; Asthma in her mother; Depression in her daughter and mother; Heart disease in her father; Hyperlipidemia in her father; Lupus in her mother; Parkinson's disease in her mother; Stroke in her father. Social History:  Patient  reports that she has never smoked. She has never used smokeless tobacco. She reports current alcohol use. She reports that she does not use drugs.  Review of Systems: Constitutional: Negative for fever malaise or anorexia Cardiovascular: negative for chest pain Respiratory: negative for SOB or persistent cough Gastrointestinal: negative for abdominal pain  Objective  Vitals: BP 122/80   Pulse 78   Temp (!) 96.4 F (35.8 C) (Temporal)   Wt 115 lb 9.6 oz (52.4 kg)   SpO2 97%   BMI 20.48 kg/m  General: no acute distress , A&Ox3 HEENT: PEERL, conjunctiva normal, neck is supple with limited range of motion, bilateral TMs with effusions and normal landmarks Cardiovascular:  RRR without murmur or gallop.  Respiratory:  Good breath sounds bilaterally, CTAB with normal respiratory effort Skin:  Warm, no rashes Neuro: Nonfocal, negative Hallpike, no nystagmus   Hearing Screening   125Hz  250Hz  500Hz  1000Hz  2000Hz  3000Hz  4000Hz  6000Hz  8000Hz   Right ear:   Fail Pass Fail  Fail    Left ear:   Fail Pass Fail  Fail       Commons side effects, risks, benefits, and alternatives for medications and treatment plan prescribed today were discussed, and the patient expressed understanding of the given instructions. Patient is instructed to call or message via MyChart if he/she has any questions or concerns regarding our treatment plan. No barriers to understanding were identified. We discussed Red Flag symptoms and signs in detail. Patient expressed understanding regarding what to do in case of urgent or emergency type  symptoms.   Medication list was reconciled, printed and provided to the patient in AVS. Patient instructions and summary information was reviewed with the patient as documented in the AVS. This note was prepared with assistance of Dragon voice recognition software. Occasional wrong-word or sound-a-like substitutions may have occurred due to the inherent limitations of voice recognition software  This visit occurred during the SARS-CoV-2 public health emergency.  Safety protocols were in place, including screening questions prior to the visit, additional usage of staff PPE, and extensive cleaning of exam room while observing appropriate contact time as indicated for disinfecting solutions.

## 2020-09-30 NOTE — Progress Notes (Signed)
Office Visit Note  Patient: Cheryl Austin             Date of Birth: 1941/01/17           MRN: [id number]             PCP: Leamon Arnt, MD Referring: Leamon Arnt, MD Visit Date: 10/14/2020 Occupation: @GUAROCC @  Subjective:  Routine follow up   History of Present Illness: Cheryl Austin is a 80 y.o. female with history of polymyalgia rheumatica. She is no longer on prednisone or methotrexate.  According to the patient after her last office visit on 04/18/2020 she discontinued methotrexate.  She states that she has not had any signs or symptoms of a flare.  She denies any myalgias, muscle weakness, joint stiffness, or other arthralgias.  She denies any joint swelling.  She has occasional discomfort in stiffness in her lower back but denies any symptoms of radiculopathy.  She takes tramadol very sparingly for pain relief.  She has started going to the gym 4 days a week and has noticed increased muscle strength.  She denies any headaches, vision changes, or temporal artery tenderness.  She denies any new or worsening symptoms. She is no longer taking Fosamax.  She has an upcoming bone density scheduled on 10/18/2020.    Activities of Daily Living:  Patient reports morning stiffness for 0 minutes.   Patient Denies nocturnal pain.  Difficulty dressing/grooming: Denies Difficulty climbing stairs: Denies Difficulty getting out of chair: Denies Difficulty using hands for taps, buttons, cutlery, and/or writing: Denies  Review of Systems  Constitutional: Negative for fatigue.  HENT: Positive for nose dryness. Negative for mouth sores and mouth dryness.   Eyes: Positive for dryness. Negative for pain and itching.  Respiratory: Negative for shortness of breath and difficulty breathing.   Cardiovascular: Negative for chest pain and palpitations.  Gastrointestinal: Negative for blood in stool, constipation and diarrhea.  Endocrine: Negative for increased urination.  Genitourinary:  Negative for difficulty urinating.  Musculoskeletal: Negative for arthralgias, joint pain, joint swelling, myalgias, morning stiffness, muscle tenderness and myalgias.  Skin: Negative for color change, rash and redness.  Allergic/Immunologic: Negative for susceptible to infections.  Neurological: Negative for dizziness, numbness, headaches, memory loss and weakness.  Hematological: Negative for bruising/bleeding tendency.  Psychiatric/Behavioral: Negative for confusion.    PMFS History:  Patient Active Problem List   Diagnosis Date Noted  . 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  . Polymyalgia rheumatica ([place]) 10/23/2019  . Spinal stenosis, lumbar region without neurogenic claudication 10/23/2019  . Lumbar facet arthropathy 10/23/2019  . 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 Spondylosis of cervical region without myelopathy or radiculopathy 05/29/2018  . Family history of Parkinson disease 05/21/2016  . Seborrheic dermatitis 11/02/2014  . Osteopenia 07/13/2013  . Moderate episode of recurrent major depressive disorder ([place]) 06/22/2013  . Dermatitis, atopic 01/30/2013  . Menopausal hot flushes 01/30/2013  . MRSA cellulitis 01/30/2013  . 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 Insomnia 03/18/2012  . Hypothyroidism 12/18/2010    Past Medical History:  Diagnosis Date  . Anxiety   . 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 Family history of Parkinson disease 05/21/2016   Mother  . GERD (gastroesophageal reflux disease)   . Kidney failure    blood transfusion  . Moderate episode of recurrent major depressive disorder ([place])   . Osteoporosis   . Polymyalgia rheumatica ([place]) 10/23/2019   Aug 2020, Dr. Estanislado Pandy  .  Restless legs syndrome (RLS) 08/02/2017  . 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 Thyroid disease    Hypothyroid    Family History  Problem Relation Age  of Onset  . Lupus Mother   . Asthma Mother   . Parkinson's disease Mother   . Depression Mother   . Heart disease Father   . Hyperlipidemia Father   . Stroke Father   . Alcohol abuse Daughter   . Depression Daughter    Past Surgical History:  Procedure Laterality Date  . APPENDECTOMY  1961  . BREAST SURGERY Bilateral 1987 1998   Lumpectomy  . EYE SURGERY    . TOTAL ABDOMINAL HYSTERECTOMY  1986   BSO/Fibroids   Social History   Social History Narrative   Drinks 2-3 caffeine drinks a day    Immunization History  Administered Date(s) Administered  . DT (Pediatric) 05/21/2008  . Fluad Quad(high Dose 65+) 04/27/2020  . Influenza Split 06/20/2010, 05/14/2011, 06/18/2012, 06/22/2013, 07/02/2014, 07/05/2015  . Influenza, High Dose Seasonal PF 06/18/2012, 06/18/2012, 06/22/2013, 06/22/2013, 07/02/2014, 07/02/2014, 07/05/2015, 07/05/2015, 05/21/2016, 05/21/2016, 08/02/2017  . Influenza, Seasonal, Injecte, Preservative Fre 05/14/2011  . Influenza,inj,Quad PF,6+ Mos 05/21/2016, 05/29/2018  . Influenza,trivalent, recombinat, inj, PF 05/14/2011  . Influenza-Unspecified 05/14/2011, 06/18/2012, 06/22/2013, 07/02/2014, 07/05/2015  . PFIZER(Purple Top)SARS-COV-2 Vaccination 10/11/2019, 11/04/2019  . Pneumococcal Conjugate-13 07/04/2014, 07/04/2014  . Pneumococcal Polysaccharide-23 03/20/2005, 06/22/2011  . Pneumococcal-Unspecified 03/20/2005, 06/22/2011  . Td 05/21/2008  . Tdap 06/22/2011, 06/19/2013     Objective: Vital Signs: BP 119/66 (BP Location: Left Arm, Patient Position: Sitting, Cuff Size: Normal)   Pulse 70   Resp 13   Ht _0  (1.6 m)   Wt 121 lb 3.2 oz (55 kg)   BMI 21.47 kg/m    Physical Exam Vitals and nursing note reviewed.  Constitutional:      Appearance: She is well-developed and well-nourished.  HENT:     Head: Normocephalic and atraumatic.  Eyes:     Extraocular Movements: EOM normal.     Conjunctiva/sclera: Conjunctivae normal.  Cardiovascular:      Pulses: Intact distal pulses.  Pulmonary:     Effort: Pulmonary effort is normal.  Abdominal:     Palpations: Abdomen is soft.  Musculoskeletal:     Cervical back: Normal range of motion.  Skin:    General: Skin is warm and dry.     Capillary Refill: Capillary refill takes less than 2 seconds.  Neurological:     Mental Status: She is alert and oriented to person, place, and time.  Psychiatric:        Mood and Affect: Mood and affect normal.        Behavior: Behavior normal.      Musculoskeletal Exam: C-spine, thoracic spine, lumbar spine have good range of motion with no discomfort.  No midline spinal tenderness or SI joint tenderness.  Shoulder joints, elbow joints, wrist joints, MCPs, PIPs, DIPs have good range of motion with no synovitis.  She has PIP and DIP thickening consistent with osteoarthritis of both hands.  Full strength of upper extremities noted.  Hip joints have good range of motion with no discomfort.  No tenderness over trochanteric bursa bilaterally.  Knee joints have good range of motion with no warmth or effusion.  Ankle joints have good range of motion with no tenderness or inflammation.  Full strength of lower extremities noted.  CDAI Exam: CDAI Score: - Patient Global: -; Provider Global: - Swollen: -;  Tender: - Joint Exam 10/14/2020   No joint exam has been documented for this visit   There is currently no information documented on the homunculus. Go to the Rheumatology activity and complete the homunculus joint exam.  Investigation: No additional findings.  Imaging: No results found.  Recent Labs: Lab Results  Component Value Date   WBC 4.3 04/19/2020   HGB 12.7 04/19/2020   PLT 239 04/19/2020   NA 135 04/19/2020   K 4.3 04/19/2020   CL 100 04/19/2020   CO2 27 04/19/2020   GLUCOSE 83 04/19/2020   BUN 12 04/19/2020   CREATININE 0.76 04/19/2020   BILITOT 0.7 04/19/2020   ALKPHOS 91 05/14/2019   AST 33 04/19/2020   ALT 25 04/19/2020   PROT  6.2 04/19/2020   ALBUMIN 4.3 05/14/2019   CALCIUM 9.3 04/19/2020   GFRAA 88 04/19/2020   QFTBGOLDPLUS NEGATIVE 07/06/2019    Speciality Comments: No specialty comments available.  Procedures:  No procedures performed Allergies: Patient has no known allergies.     Assessment / Plan:     Visit Diagnoses: Polymyalgia rheumatica ([place]): ESR was 2 on 04/19/2020:  She has not had any recurrence of symptoms.  She has no myalgias, muscle tenderness, or muscle weakness.  She has no difficulty raising her arms above her head or rising from a seated position.  She has full strength of upper and lower extremities on examination today.  She has no signs of inflammatory arthritis at this time.  She discontinued prednisone and June 2021 and discontinued methotrexate on her own in August 2021.  She has not noticed any new or worsening symptoms since discontinuing either medication.  She has started to exercise at the gym 4 days a week and has been working on strengthening exercises.  She does not want to restart methotrexate at this time.  She was advised to monitor her symptoms closely and notify us if she develops symptoms of a flare.  She will follow-up in the office in 6 months.  High risk medication use -Patient discontinued Methotrexate on her own in August 2021.  She has not had any recurrence of symptoms and does not want to restart MTX at this time.  CBC and CMP drawn on 04/19/20.   Primary osteoarthritis of both hands: She has PIP and DIP thickening consistent with osteoarthritis of both hands.  No joint tenderness or inflammation was noted.  She was able to make a complete fist bilaterally.  She has not been experiencing any discomfort, stiffness, or swelling in her hands recently.  Spondylosis of cervical region without myelopathy or radiculopathy: She has good range of motion of the C-spine with no discomfort at this time  Spondylosis of lumbar region without myelopathy or radiculopathy: She  continues to experience intermittent stiffness and discomfort in her lumbar region.  She has not had any symptoms of radiculopathy.  She has no midline spinal tenderness or SI joint tenderness on exam.  She has started going to the gym 4 days a week which has been improving her lower back stiffness.  She takes tramadol very sparingly for pain relief.  Osteopenia of multiple sites -   She is scheduled to update DEXA on 10/18/20.  Most recent DEXA was in 07/10/18: RFN T-score -2.3.  She is currently on a drug holiday from Fosamax.  She denies any falls or fractures recently.  She has started to go to the gym 4 days a week and has been working out on a treadmill as  well as performing strengthening exercises.  Other medical conditions are listed as follows  Vitamin D deficiency  Seborrheic dermatitis  Other insomnia  History of depression  History of hypothyroidism  History of MRSA infection  Family history of Parkinson disease  Family history of systemic lupus erythematosus (SLE) in mother  Orders: No orders of the defined types were placed in this encounter.  No orders of the defined types were placed in this encounter.    Follow-Up Instructions: Return in about 6 months (around 04/13/2021) for Polymyalgia Rheumatica.   Ofilia Neas, PA-C  Note - This record has been created using Dragon software.  Chart creation errors have been sought, but may not always  have been located. Such creation errors do not reflect on  the standard of medical care.

## 2020-10-07 ENCOUNTER — Ambulatory Visit (INDEPENDENT_AMBULATORY_CARE_PROVIDER_SITE_OTHER): Payer: Medicare Other

## 2020-10-07 DIAGNOSIS — Z0000 Encounter for general adult medical examination without abnormal findings: Secondary | ICD-10-CM | POA: Diagnosis not present

## 2020-10-07 NOTE — Patient Instructions (Addendum)
Cheryl Austin , Thank you for taking time to come for your Medicare Wellness Visit. I appreciate your ongoing commitment to your health goals. Please review the following plan we discussed and let me know if I can assist you in the future.   Screening recommendations/referrals: Colonoscopy: No longer required Mammogram: No longer required Bone Density: Done 07/10/18 Recommended yearly ophthalmology/optometry visit for glaucoma screening and checkup Recommended yearly dental visit for hygiene and checkup  Vaccinations: Influenza vaccine: Done 04/27/20 Pneumococcal vaccine: Up to date Tdap vaccine: Up to date Shingles vaccine: Shingrix discussed. Please contact your pharmacy for coverage information.    Covid-19:Completed 09/22/19 & 11/04/19  Advanced directives: Please bring a copy of your health care power of attorney and living will to the office at your convenience.  Conditions/risks identified: None at this time  Next appointment: Follow up in one year for your annual wellness visit    Preventive Care 65 Years and Older, Female Preventive care refers to lifestyle choices and visits with your health care provider that can promote health and wellness. What does preventive care include?  A yearly physical exam. This is also called an annual well check.  Dental exams once or twice a year.  Routine eye exams. Ask your health care provider how often you should have your eyes checked.  Personal lifestyle choices, including:  Daily care of your teeth and gums.  Regular physical activity.  Eating a healthy diet.  Avoiding tobacco and drug use.  Limiting alcohol use.  Practicing safe sex.  Taking low-dose aspirin every day.  Taking vitamin and mineral supplements as recommended by your health care provider. What happens during an annual well check? The services and screenings done by your health care provider during your annual well check will depend on your age, overall health,  lifestyle risk factors, and family history of disease. Counseling  Your health care provider may ask you questions about your:  Alcohol use.  Tobacco use.  Drug use.  Emotional well-being.  Home and relationship well-being.  Sexual activity.  Eating habits.  History of falls.  Memory and ability to understand (cognition).  Work and work Astronomer.  Reproductive health. Screening  You may have the following tests or measurements:  Height, weight, and BMI.  Blood pressure.  Lipid and cholesterol levels. These may be checked every 5 years, or more frequently if you are over 92 years old.  Skin check.  Lung cancer screening. You may have this screening every year starting at age 37 if you have a 30-pack-year history of smoking and currently smoke or have quit within the past 15 years.  Fecal occult blood test (FOBT) of the stool. You may have this test every year starting at age 81.  Flexible sigmoidoscopy or colonoscopy. You may have a sigmoidoscopy every 5 years or a colonoscopy every 10 years starting at age 76.  Hepatitis C blood test.  Hepatitis B blood test.  Sexually transmitted disease (STD) testing.  Diabetes screening. This is done by checking your blood sugar (glucose) after you have not eaten for a while (fasting). You may have this done every 1-3 years.  Bone density scan. This is done to screen for osteoporosis. You may have this done starting at age 74.  Mammogram. This may be done every 1-2 years. Talk to your health care provider about how often you should have regular mammograms. Talk with your health care provider about your test results, treatment options, and if necessary, the need for more tests.  Vaccines  Your health care provider may recommend certain vaccines, such as:  Influenza vaccine. This is recommended every year.  Tetanus, diphtheria, and acellular pertussis (Tdap, Td) vaccine. You may need a Td booster every 10 years.  Zoster  vaccine. You may need this after age 24.  Pneumococcal 13-valent conjugate (PCV13) vaccine. One dose is recommended after age 47.  Pneumococcal polysaccharide (PPSV23) vaccine. One dose is recommended after age 37. Talk to your health care provider about which screenings and vaccines you need and how often you need them. This information is not intended to replace advice given to you by your health care provider. Make sure you discuss any questions you have with your health care provider. Document Released: 09/02/2015 Document Revised: 04/25/2016 Document Reviewed: 06/07/2015 Elsevier Interactive Patient Education  2017 [place] Prevention in the Home Falls can cause injuries. They can happen to people of all ages. There are many things you can do to make your home safe and to help prevent falls. What can I do on the outside of my home?  Regularly fix the edges of walkways and driveways and fix any cracks.  Remove anything that might make you trip as you walk through a door, such as a raised step or threshold.  Trim any bushes or trees on the path to your home.  Use bright outdoor lighting.  Clear any walking paths of anything that might make someone trip, such as rocks or tools.  Regularly check to see if handrails are loose or broken. Make sure that both sides of any steps have handrails.  Any raised decks and porches should have guardrails on the edges.  Have any leaves, snow, or ice cleared regularly.  Use sand or salt on walking paths during winter.  Clean up any spills in your garage right away. This includes oil or grease spills. What can I do in the bathroom?  Use night lights.  Install grab bars by the toilet and in the tub and shower. Do not use towel bars as grab bars.  Use non-skid mats or decals in the tub or shower.  If you need to sit down in the shower, use a plastic, non-slip stool.  Keep the floor dry. Clean up any water that spills on the  floor as soon as it happens.  Remove soap buildup in the tub or shower regularly.  Attach bath mats securely with double-sided non-slip rug tape.  Do not have throw rugs and other things on the floor that can make you trip. What can I do in the bedroom?  Use night lights.  Make sure that you have a light by your bed that is easy to reach.  Do not use any sheets or blankets that are too big for your bed. They should not hang down onto the floor.  Have a firm chair that has side arms. You can use this for support while you get dressed.  Do not have throw rugs and other things on the floor that can make you trip. What can I do in the kitchen?  Clean up any spills right away.  Avoid walking on wet floors.  Keep items that you use a lot in easy-to-reach places.  If you need to reach something above you, use a strong step stool that has a grab bar.  Keep electrical cords out of the way.  Do not use floor polish or wax that makes floors slippery. If you must use wax, use non-skid floor wax.  Do not have throw rugs and other things on the floor that can make you trip. What can I do with my stairs?  Do not leave any items on the stairs.  Make sure that there are handrails on both sides of the stairs and use them. Fix handrails that are broken or loose. Make sure that handrails are as long as the stairways.  Check any carpeting to make sure that it is firmly attached to the stairs. Fix any carpet that is loose or worn.  Avoid having throw rugs at the top or bottom of the stairs. If you do have throw rugs, attach them to the floor with carpet tape.  Make sure that you have a light switch at the top of the stairs and the bottom of the stairs. If you do not have them, ask someone to add them for you. What else can I do to help prevent falls?  Wear shoes that:  Do not have high heels.  Have rubber bottoms.  Are comfortable and fit you well.  Are closed at the toe. Do not wear  sandals.  If you use a stepladder:  Make sure that it is fully opened. Do not climb a closed stepladder.  Make sure that both sides of the stepladder are locked into place.  Ask someone to hold it for you, if possible.  Clearly mark and make sure that you can see:  Any grab bars or handrails.  First and last steps.  Where the edge of each step is.  Use tools that help you move around (mobility aids) if they are needed. These include:  Canes.  Walkers.  Scooters.  Crutches.  Turn on the lights when you go into a dark area. Replace any light bulbs as soon as they burn out.  Set up your furniture so you have a clear path. Avoid moving your furniture around.  If any of your floors are uneven, fix them.  If there are any pets around you, be aware of where they are.  Review your medicines with your doctor. Some medicines can make you feel dizzy. This can increase your chance of falling. Ask your doctor what other things that you can do to help prevent falls. This information is not intended to replace advice given to you by your health care provider. Make sure you discuss any questions you have with your health care provider. Document Released: 06/02/2009 Document Revised: 01/12/2016 Document Reviewed: 09/10/2014 Elsevier Interactive Patient Education  2017 [employer].

## 2020-10-07 NOTE — Progress Notes (Signed)
Virtual Visit via Telephone Note  I connected with  Cheryl Austin on 10/07/20 at 11:00 AM EST by telephone and verified that I am speaking with the correct person using two identifiers.  Medicare Annual Wellness visit completed telephonically due to Covid-19 pandemic.   Persons participating in this call: This Health Coach and this patient.   Location: Patient: Home Provider: Office   I discussed the limitations, risks, security and privacy concerns of performing an evaluation and management service by telephone and the availability of in person appointments. The patient expressed understanding and agreed to proceed.  Unable to perform video visit due to video visit attempted and failed and/or patient does not have video capability.   Some vital signs may be absent or patient reported.   Marzella Schleinina H Kashawn Manzano, LPN    Subjective:   Cheryl Austin is a 80 y.o. female who presents for an Initial Medicare Annual Wellness Visit.  Review of Systems     Cardiac Risk Factors include: advanced age (>[id number], 64>65 women)     Objective:    There were no vitals filed for this visit. There is no height or weight on file to calculate BMI.  Advanced Directives 10/07/2020 03/31/2019 07/18/2016  Does Patient Have a Medical Advance Directive? Yes No Yes  Type of Diplomatic Services operational officerAdvance Directive Healthcare Power of Attorney - Healthcare Power of Attorney  Copy of Healthcare Power of Attorney in Chart? No - copy requested - -  Would patient like information on creating a medical advance directive? - No - Patient declined -    Current Medications (verified) Outpatient Encounter Medications as of 10/07/2020  Medication Sig  . betamethasone dipropionate 0.05 % cream APPLY TO AFFECTED AREA TWICE A DAY  . levothyroxine (SYNTHROID) 50 MCG tablet Take 50 mcg by mouth daily before breakfast.  . LORazepam (ATIVAN) 0.5 MG tablet Take 1-2 tablets by mouth daily as needed.  . mupirocin ointment (BACTROBAN) 2 % Apply to  affected area 1-2 times daily  . sertraline (ZOLOFT) 100 MG tablet Take 0.5 tablets (50 mg total) by mouth daily.  . traMADol (ULTRAM) 50 MG tablet Take by mouth every 6 (six) hours as needed.  . traZODone (DESYREL) 100 MG tablet Take 100 mg by mouth as needed for sleep.  . [DISCONTINUED] meloxicam (MOBIC) 15 MG tablet Take 0.5-1 tablets (7.5-15 mg total) by mouth daily as needed for pain. (Patient not taking: Reported on 10/07/2020)   No facility-administered encounter medications on file as of 10/07/2020.    Allergies (verified) Patient has no known allergies.   History: Past Medical History:  Diagnosis Date  . Anxiety   . 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 Family history of Parkinson disease 05/21/2016   Mother  . GERD (gastroesophageal reflux disease)   . Kidney failure    blood transfusion  . Moderate episode of recurrent major depressive disorder (HCC)   . Osteoporosis   . Polymyalgia rheumatica (HCC) 10/23/2019   Aug 2020, Dr. Corliss Skainseveshwar  . Restless legs syndrome (RLS) 08/02/2017  . 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 Thyroid disease    Hypothyroid   Past Surgical History:  Procedure Laterality Date  . APPENDECTOMY  1961  . BREAST SURGERY Bilateral 1987 1998   Lumpectomy  . EYE SURGERY    . TOTAL ABDOMINAL HYSTERECTOMY  1986   BSO/Fibroids   Family History  Problem Relation Age of  Onset  . Lupus Mother   . Asthma Mother   . Parkinson's disease Mother   . Depression Mother   . Heart disease Father   . Hyperlipidemia Father   . Stroke Father   . Alcohol abuse Daughter   . Depression Daughter    Social History   Socioeconomic History  . Marital status: Widowed    Spouse name: Not on file  . Number of children: 1  . Years of education: 45  . Highest education level: Not on file  Occupational History  . Occupation: Retired  Tobacco Use  .  Smoking status: Never Smoker  . Smokeless tobacco: Never Used  Vaping Use  . Vaping Use: Never used  Substance and Sexual Activity  . Alcohol use: Yes    Comment: couple of times weekly (1 drink)  . Drug use: No  . Sexual activity: Never    Partners: Male    Birth control/protection: Post-menopausal  Other Topics Concern  . Not on file  Social History Narrative   Drinks 2-3 caffeine drinks a day    Social Determinants of Health   Financial Resource Strain: Low Risk   . Difficulty of Paying Living Expenses: Not hard at all  Food Insecurity: No Food Insecurity  . Worried About Programme researcher, broadcasting/film/video in the Last Year: Never true  . Ran Out of Food in the Last Year: Never true  Transportation Needs: No Transportation Needs  . Lack of Transportation (Medical): No  . Lack of Transportation (Non-Medical): No  Physical Activity: Inactive  . Days of Exercise per Week: 0 days  . Minutes of Exercise per Session: 0 min  Stress: No Stress Concern Present  . Feeling of Stress : Only a little  Social Connections: Socially Isolated  . Frequency of Communication with Friends and Family: More than three times a week  . Frequency of Social Gatherings with Friends and Family: More than three times a week  . Attends Religious Services: Never  . Active Member of Clubs or Organizations: No  . Attends Banker Meetings: Never  . Marital Status: Widowed    Tobacco Counseling Counseling given: Not Answered   Clinical Intake:  Pre-visit preparation completed: Yes  Pain : No/denies pain     BMI - recorded: 20.48 Nutritional Status: BMI of 19-24  Normal Nutritional Risks: None Diabetes: No  How often do you need to have someone help you when you read instructions, pamphlets, or other written materials from your doctor or pharmacy?: 1 - Never  Diabetic?No  Interpreter Needed?: No  Information entered by :: Lanier Ensign, LPN   Activities of Daily Living In your present  state of health, do you have any difficulty performing the following activities: 10/07/2020 11/30/2019  Hearing? Y N  Vision? N N  Difficulty concentrating or making decisions? N N  Walking or climbing stairs? N N  Dressing or bathing? N N  Doing errands, shopping? N N  Preparing Food and eating ? N -  Using the Toilet? N -  Managing your Medications? N -  Managing your Finances? N -  Housekeeping or managing your Housekeeping? N -  Some recent data might be hidden    Patient Care Team: [name], MD as PCP - General (Family Medicine) Sedalia Muta, PT as Physical Therapist (Physical Therapy)  Indicate any recent Medical Services you may have received from other than [facility] providers in the past year (date may be approximate).     Assessment:  This is a routine wellness examination for Cheryl Austin.  Hearing/Vision screen  Hearing Screening   125Hz  250Hz  500Hz  1000Hz  2000Hz  3000Hz  4000Hz  6000Hz  8000Hz   Right ear:           Left ear:           Comments: May have mild loss will get hearing test next week  Vision Screening Comments: Pt follows up with [facility] fo annual exams  Dietary issues and exercise activities discussed: Current Exercise Habits: The patient does not participate in regular exercise at present  Goals   None    Depression Screen PHQ 2/9 Scores 10/07/2020 04/27/2020 10/23/2019 10/23/2019 05/29/2018 09/10/2017 08/02/2017  PHQ - 2 Score 2 2 1 1 5 4 6   PHQ- 9 Score 4 5 7  - 17 17 20     Fall Risk Fall Risk  10/07/2020 11/30/2019 10/23/2019 08/02/2017 06/11/2016  Falls in the past year? 0 0 0 No No  Number falls in past yr: 0 0 - - -  Injury with Fall? 0 0 - - -  Follow up Falls prevention discussed - Falls evaluation completed - -    FALL RISK PREVENTION PERTAINING TO THE HOME:  Any stairs in or around the home? Yes  If so, are there any without handrails? No  Home free of loose throw rugs in walkways, pet beds, electrical cords, etc? Yes  Adequate  lighting in your home to reduce risk of falls? Yes   ASSISTIVE DEVICES UTILIZED TO PREVENT FALLS:  Life alert? No  Use of a cane, walker or w/c? No  Grab bars in the bathroom? No  Shower chair or bench in shower? Yes  Elevated toilet seat or a handicapped toilet? Yes   TIMED UP AND GO:  Was the test performed? No .      Cognitive Function:     6CIT Screen 10/07/2020  What Year? 0 points  What month? 0 points  Count back from 20 0 points  Months in reverse 0 points  Repeat phrase 0 points    Immunizations Immunization History  Administered Date(s) Administered  . DT (Pediatric) 05/21/2008  . Fluad Quad(high Dose 65+) 04/27/2020  . Influenza Split 06/20/2010, 05/14/2011, 06/18/2012, 06/22/2013, 07/02/2014, 07/05/2015  . Influenza, High Dose Seasonal PF 06/18/2012, 06/18/2012, 06/22/2013, 06/22/2013, 07/02/2014, 07/02/2014, 07/05/2015, 07/05/2015, 05/21/2016, 05/21/2016, 08/02/2017  . Influenza, Seasonal, Injecte, Preservative Fre 05/14/2011  . Influenza,inj,Quad PF,6+ Mos 05/21/2016, 05/29/2018  . Influenza,trivalent, recombinat, inj, PF 05/14/2011  . Influenza-Unspecified 05/14/2011, 06/18/2012, 06/22/2013, 07/02/2014, 07/05/2015  . PFIZER(Purple Top)SARS-COV-2 Vaccination 10/11/2019, 11/04/2019  . Pneumococcal Conjugate-13 07/04/2014, 07/04/2014  . Pneumococcal Polysaccharide-23 03/20/2005, 06/22/2011  . Pneumococcal-Unspecified 03/20/2005, 06/22/2011  . Td 05/21/2008  . Tdap 06/22/2011, 06/19/2013    TDAP status: Up to date  Flu Vaccine status: Up to date  Pneumococcal vaccine status: Up to date  Covid-19 vaccine status: Completed vaccines  Qualifies for Shingles Vaccine? Yes   Zostavax completed No   Shingrix Completed?: No.    Education has been provided regarding the importance of this vaccine. Patient has been advised to call insurance company to determine out of pocket expense if they have not yet received this vaccine. Advised may also receive vaccine at  local pharmacy or Health Dept. Verbalized acceptance and understanding.  Screening Tests Health Maintenance  Topic Date Due  . COVID-19 Vaccine (3 - Pfizer risk 4-dose series) 12/02/2019  . DEXA SCAN  07/10/2020  . TETANUS/TDAP  06/20/2023  . INFLUENZA VACCINE  Completed  . Hepatitis C Screening  Completed  . PNA vac Low Risk Adult  Completed    Health Maintenance  Health Maintenance Due  Topic Date Due  . COVID-19 Vaccine (3 - Pfizer risk 4-dose series) 12/02/2019  . DEXA SCAN  07/10/2020    Colorectal cancer screening: No longer required.   Mammogram status: No longer required due to age.  Bone Density status: Completed 07/10/18. Results reflect: Bone density results: OSTEOPENIA. Repeat every 2 years.  Additional Screening:  Hepatitis C Screening: Completed 07/06/19  Vision Screening: Recommended annual ophthalmology exams for early detection of glaucoma and other disorders of the eye. Is the patient up to date with their annual eye exam?  Yes  Who is the provider or what is the name of the office in which the patient attends annual eye exams? [facility] If pt is not established with a provider, would they like to be referred to a provider to establish care? No .   Dental Screening: Recommended annual dental exams for proper oral hygiene  Community Resource Referral / Chronic Care Management: CRR required this visit?  No   CCM required this visit?  No      Plan:     I have personally reviewed and noted the following in the patient's chart:   . Medical and social history . Use of alcohol, tobacco or illicit drugs  . Current medications and supplements . Functional ability and status . Nutritional status . Physical activity . Advanced directives . List of other physicians . Hospitalizations, surgeries, and ER visits in previous 12 months . Vitals . Screenings to include cognitive, depression, and falls . Referrals and appointments  In  addition, I have reviewed and discussed with patient certain preventive protocols, quality metrics, and best practice recommendations. A written personalized care plan for preventive services as well as general preventive health recommendations were provided to patient.     Marzella Schlein, LPN   7/35/3299   Nurse Notes: None

## 2020-10-14 ENCOUNTER — Other Ambulatory Visit: Payer: Self-pay

## 2020-10-14 ENCOUNTER — Encounter: Payer: Self-pay | Admitting: Physician Assistant

## 2020-10-14 ENCOUNTER — Ambulatory Visit (INDEPENDENT_AMBULATORY_CARE_PROVIDER_SITE_OTHER): Payer: Medicare Other | Admitting: Physician Assistant

## 2020-10-14 VITALS — BP 119/66 | HR 70 | Resp 13 | Ht 63.0 in | Wt 121.2 lb

## 2020-10-14 DIAGNOSIS — M19041 Primary osteoarthritis, right hand: Secondary | ICD-10-CM | POA: Diagnosis not present

## 2020-10-14 DIAGNOSIS — M47816 Spondylosis without myelopathy or radiculopathy, lumbar region: Secondary | ICD-10-CM

## 2020-10-14 DIAGNOSIS — E559 Vitamin D deficiency, unspecified: Secondary | ICD-10-CM

## 2020-10-14 DIAGNOSIS — G4709 Other insomnia: Secondary | ICD-10-CM

## 2020-10-14 DIAGNOSIS — M47812 Spondylosis without myelopathy or radiculopathy, cervical region: Secondary | ICD-10-CM

## 2020-10-14 DIAGNOSIS — Z8614 Personal history of Methicillin resistant Staphylococcus aureus infection: Secondary | ICD-10-CM

## 2020-10-14 DIAGNOSIS — Z79899 Other long term (current) drug therapy: Secondary | ICD-10-CM | POA: Diagnosis not present

## 2020-10-14 DIAGNOSIS — M8589 Other specified disorders of bone density and structure, multiple sites: Secondary | ICD-10-CM

## 2020-10-14 DIAGNOSIS — L219 Seborrheic dermatitis, unspecified: Secondary | ICD-10-CM

## 2020-10-14 DIAGNOSIS — M353 Polymyalgia rheumatica: Secondary | ICD-10-CM | POA: Diagnosis not present

## 2020-10-14 DIAGNOSIS — Z8639 Personal history of other endocrine, nutritional and metabolic disease: Secondary | ICD-10-CM

## 2020-10-14 DIAGNOSIS — Z8269 Family history of other diseases of the musculoskeletal system and connective tissue: Secondary | ICD-10-CM

## 2020-10-14 DIAGNOSIS — Z8659 Personal history of other mental and behavioral disorders: Secondary | ICD-10-CM

## 2020-10-14 DIAGNOSIS — Z820 Family history of epilepsy and other diseases of the nervous system: Secondary | ICD-10-CM

## 2020-10-14 DIAGNOSIS — M19042 Primary osteoarthritis, left hand: Secondary | ICD-10-CM

## 2020-10-18 ENCOUNTER — Ambulatory Visit
Admission: RE | Admit: 2020-10-18 | Discharge: 2020-10-18 | Disposition: A | Discharge status: Home / Self Care | Payer: Medicare Other | Source: Ambulatory Visit | Attending: Family Medicine | Admitting: Family Medicine

## 2020-10-18 ENCOUNTER — Other Ambulatory Visit: Payer: Self-pay

## 2020-10-18 DIAGNOSIS — Z780 Asymptomatic menopausal state: Secondary | ICD-10-CM

## 2020-10-18 DIAGNOSIS — M8580 Other specified disorders of bone density and structure, unspecified site: Secondary | ICD-10-CM

## 2020-10-21 ENCOUNTER — Encounter: Payer: Self-pay | Admitting: Family Medicine

## 2020-10-21 ENCOUNTER — Other Ambulatory Visit: Payer: Self-pay

## 2020-10-21 DIAGNOSIS — M8580 Other specified disorders of bone density and structure, unspecified site: Secondary | ICD-10-CM

## 2020-10-25 LAB — HM DEXA SCAN

## 2020-10-26 ENCOUNTER — Other Ambulatory Visit: Payer: Self-pay

## 2020-10-26 ENCOUNTER — Encounter: Payer: Self-pay | Admitting: Family Medicine

## 2020-10-26 ENCOUNTER — Ambulatory Visit: Payer: Medicare Other | Admitting: Family Medicine

## 2020-10-26 VITALS — BP 112/78 | HR 72 | Temp 98.2°F | Resp 16 | Wt 118.0 lb

## 2020-10-26 DIAGNOSIS — F339 Major depressive disorder, recurrent, unspecified: Secondary | ICD-10-CM

## 2020-10-26 DIAGNOSIS — M47812 Spondylosis without myelopathy or radiculopathy, cervical region: Secondary | ICD-10-CM

## 2020-10-26 DIAGNOSIS — L089 Local infection of the skin and subcutaneous tissue, unspecified: Secondary | ICD-10-CM | POA: Diagnosis not present

## 2020-10-26 DIAGNOSIS — F411 Generalized anxiety disorder: Secondary | ICD-10-CM | POA: Diagnosis not present

## 2020-10-26 DIAGNOSIS — Z22322 Carrier or suspected carrier of Methicillin resistant Staphylococcus aureus: Secondary | ICD-10-CM

## 2020-10-26 NOTE — Progress Notes (Signed)
Subjective  CC:  Chief Complaint  Patient presents with  . Anxiety    Currently taking Zoloft 50 mg daily, states as soon as she restarted - vertigo went away   . hand numbness    Right hand, states mostly when her hand is under hot water  . Acne    Mostly on nose, extremely painful     HPI: Cheryl Austin is a 80 y.o. female who presents to the office today to address the problems listed above in the chief complaint, mood problems.  Patient restarted her sertraline after her last visit and almost immediately, her withdrawal symptoms resolved.  No longer with vertigo or feeling lightheaded.  She continues with her baseline levels of anxiety.  She feels a "used to it".  Currently not interested in adjusting medications but will maintain sertraline 50 mg daily.  No panic attacks.  Her depressive symptoms are well controlled on the current medication.  Complains of distal third finger numbness typically every morning when she washes her face.  Sometimes she will awaken at night having to put her hand to remove the numbness.  She denies weakness or pain.  No other fingers seem to be involved.  She denies neck pain.  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  I reviewed an MRI from 2008.  She did have some foraminal stenosis on the right.  There was no central canal stenosis.  Pustule on the inside of her left nostril, recurrent.  She has carry chronic MRSA colonization.  She has been using mupirocin ointment for the last 3 days and symptoms are improving.  No drainage or fever.   Depression screen [facility] 2/9 10/07/2020 04/27/2020 10/23/2019  Decreased Interest 1 1 0  Down, Depressed, Hopeless 1 1 1   PHQ - 2 Score 2 2 1   Altered sleeping 0 0 0  Tired, decreased energy 1 1 0  Change in appetite 0 0 0  Feeling bad or failure about yourself  0 1 3  Trouble concentrating 1 1 3   Moving slowly or fidgety/restless 0 0 0  Suicidal thoughts 0 0 0  PHQ-9 Score 4 5 7   Difficult doing  work/chores Somewhat difficult Somewhat difficult Somewhat difficult   GAD 7 : Generalized Anxiety Score 10/26/2020 08/02/2017  Nervous, Anxious, on Edge 3 3  Control/stop worrying 3 3  Worry too much - different things 3 3  Trouble relaxing 3 3  Restless 3 3  Easily annoyed or irritable 1 2  Afraid - awful might happen 2 2  Total GAD 7 Score 18 19  Anxiety Difficulty Somewhat difficult Somewhat difficult    Assessment  1. Spondylosis of cervical region without myelopathy or radiculopathy   2. GAD (generalized anxiety disorder)   3. Major depression, recurrent, chronic (HCC)   4. Pustule of nostril   5. MRSA nasal colonization      Plan   Depression and chronic anxiety: Education counseling done.  Depressive symptoms are well controlled.  She will continue sertraline 50 mg daily.  Recommend monitoring her anxiety symptoms.  If at some point she would like help with this further, would increase sertraline to 100 mg daily.  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  Educated.  Because this is not painful and has no red flag symptoms or weakness, patient Raynaud's monitoring.  She will follow-up if worsens.  Consider trial of  vitamin B6 twice daily.  MRSA and nasal pustule: Continue mupirocin twice daily for 7 days.  Improving  Reviewed concept of mood problems caused by biochemical imbalance of neurotransmitters and rationale for treatment with medications and therapy.   Counseling given: pt was instructed to contact office, on-call physician or crisis Hotline if symptoms worsen significantly. If patient develops any suicidal or homicidal thoughts, she is directed to the ER immediately.   Follow up: Return in about 6 months (around 04/28/2021) for complete physical.  No orders of the defined types were placed in this encounter.  No orders of the defined types were placed in this encounter.      I reviewed the patients updated PMH, FH, and SocHx.    Patient Active Problem List   Diagnosis Date Noted  . Polymyalgia rheumatica (HCC) 10/23/2019    Priority: High  . Moderate episode of recurrent major depressive disorder (HCC) 06/22/2013    Priority: High  . 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 Hypothyroidism 12/18/2010    Priority: High  . Spinal stenosis, lumbar region without neurogenic claudication 10/23/2019    Priority: Medium  . Spondylosis of cervical region without myelopathy or radiculopathy 05/29/2018    Priority: Medium  . Osteopenia 07/13/2013    Priority: Medium  . Dermatitis, atopic 01/30/2013    Priority: Medium  . 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 Insomnia 03/18/2012    Priority: Medium  . Family history of Parkinson disease 05/21/2016    Priority: Low  . Seborrheic dermatitis 11/02/2014    Priority: Low  . Menopausal hot flushes 01/30/2013    Priority: Low  . GAD (generalized anxiety disorder) 10/26/2020  . MRSA nasal colonization 10/26/2020  . 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  . Lumbar facet arthropathy 10/23/2019  . 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 betamethasone dipropionate 0.05 % cream APPLY TO AFFECTED AREA TWICE A DAY  . levothyroxine (SYNTHROID) 50 MCG tablet Take 50 mcg by mouth daily before breakfast.  . LORazepam (ATIVAN) 0.5 MG tablet Take 1-2 tablets by mouth daily as needed.  . mupirocin ointment (BACTROBAN) 2 % Apply to affected area 1-2 times daily (Patient taking differently: as needed. Apply to affected area 1-2 times daily)  . sertraline (ZOLOFT) 100 MG tablet Take 0.5 tablets (50 mg total) by mouth daily.  . traMADol (ULTRAM) 50 MG tablet Take by mouth every 6 (six) hours as needed.  . traZODone (DESYREL) 100 MG tablet Take 100 mg by mouth as needed for sleep.    Allergies: Patient has No Known Allergies. Family history:  Patient family  history includes Alcohol abuse in her daughter; Asthma in her mother; Depression in her daughter and mother; Heart disease in her father; Hyperlipidemia in her father; Lupus in her mother; Parkinson's disease in her mother; Stroke in her father. Social History   Socioeconomic History  . Marital status: Widowed    Spouse name: Not on file  . Number of children: 1  . Years of education: 2  . Highest education level: Not on file  Occupational History  . Occupation: Retired  Tobacco Use  . Smoking status: Never Smoker  . Smokeless tobacco: Never Used  Vaping Use  . Vaping Use: Never used  Substance and Sexual Activity  . Alcohol use: Yes    Comment: couple of times weekly (1 drink)  . Drug use: No  . Sexual activity: Never  Partners: Male    Birth control/protection: Post-menopausal  Other Topics Concern  . Not on file  Social History Narrative   Drinks 2-3 caffeine drinks a day    Social Determinants of Health   Financial Resource Strain: Low Risk   . Difficulty of Paying Living Expenses: Not hard at all  Food Insecurity: No Food Insecurity  . Worried About Programme researcher, broadcasting/film/video in the Last Year: Never true  . Ran Out of Food in the Last Year: Never true  Transportation Needs: No Transportation Needs  . Lack of Transportation (Medical): No  . Lack of Transportation (Non-Medical): No  Physical Activity: Inactive  . Days of Exercise per Week: 0 days  . Minutes of Exercise per Session: 0 min  Stress: No Stress Concern Present  . Feeling of Stress : Only a little  Social Connections: Socially Isolated  . Frequency of Communication with Friends and Family: More than three times a week  . Frequency of Social Gatherings with Friends and Family: More than three times a week  . Attends Religious Services: Never  . Active Member of Clubs or Organizations: No  . Attends Banker Meetings: Never  . Marital Status: Widowed     Review of Systems: Constitutional:  Negative for fever malaise or anorexia Cardiovascular: negative for chest pain Respiratory: negative for SOB or persistent cough Gastrointestinal: negative for abdominal pain  Objective  Vitals: BP 112/78   Pulse 72   Temp 98.2 F (36.8 C) (Temporal)   Resp 16   Wt 118 lb (53.5 kg)   SpO2 97%   BMI 20.90 kg/m  General: no acute distress, well appearing, no apparent distress, well groomed Negative Phalen's on right, normal Mild nasal pustule on left    Commons side effects, risks, benefits, and alternatives for medications and treatment plan prescribed today were discussed, and the patient expressed understanding of the given instructions. Patient is instructed to call or message via MyChart if he/she has any questions or concerns regarding our treatment plan. No barriers to understanding were identified. We discussed Red Flag symptoms and signs in detail. Patient expressed understanding regarding what to do in case of urgent or emergency type symptoms.   Medication list was reconciled, printed and provided to the patient in AVS. Patient instructions and summary information was reviewed with the patient as documented in the AVS. This note was prepared with assistance of Dragon voice recognition software. Occasional wrong-word or sound-a-like substitutions may have occurred due to the inherent limitations of voice recognition software

## 2020-10-26 NOTE — Patient Instructions (Signed)
Please return in 6 months for your annual complete physical; please come fasting.   If you have any questions or concerns, please don't hesitate to send me a message via MyChart or call the office at [phone]. Thank you for visiting with us today! It's our pleasure caring for you.  

## 2020-10-28 ENCOUNTER — Encounter: Payer: Self-pay | Admitting: Family Medicine

## 2020-10-31 ENCOUNTER — Ambulatory Visit: Payer: Medicare Other | Admitting: Family Medicine

## 2020-11-08 ENCOUNTER — Encounter: Payer: Self-pay | Admitting: Family Medicine

## 2020-11-08 ENCOUNTER — Telehealth: Payer: Self-pay

## 2020-11-08 NOTE — Telephone Encounter (Signed)
Bone density has worsened.  On drug holiday from fosamax. Recommend restarting medications  May also discus with Dr. Loni Beckwith. Please fax results to her as well.  Thanks.

## 2020-11-08 NOTE — Telephone Encounter (Signed)
I have made copies and LMOVM for Dr. Nyra Jabs office to return my call with fax #

## 2020-11-08 NOTE — Telephone Encounter (Signed)
Bone Density performed 10/25/20  Severe Osteoporosis in right femoral neck score of -2.60  2 yr follow up DEXA recommended

## 2020-11-08 NOTE — Telephone Encounter (Signed)
Results sent to Dr. Loni Beckwith office

## 2020-11-08 NOTE — Telephone Encounter (Signed)
Patient given DEXA results. OK with starting fosamax. States she does not see Dr. Loni Beckwith anymore.

## 2020-11-09 ENCOUNTER — Telehealth: Payer: Self-pay | Admitting: *Deleted

## 2020-11-09 NOTE — Progress Notes (Signed)
Office Visit Note  Patient: Cheryl Austin             Date of Birth: 10-25-1940           MRN: [id number]             PCP: Leamon Arnt, MD Referring: Leamon Arnt, MD Visit Date: 11/10/2020 Occupation: @GUAROCC @  Subjective:  Medication management.   History of Present Illness: Cheryl Austin is a 80 y.o. female with history of polymyalgia rheumatica, osteoarthritis and osteoporosis.  She came to discuss her most recent DEXA scan.  She denies any muscular weakness or tenderness.  She continues to have some pain and stiffness in her hands but no joint swelling.  She denies any discomfort in her cervical or lumbar spine.  She states she had recent up visit with the dentist and all her teeth were healthy.  Activities of Daily Living:  Patient reports morning stiffness for 0 minutes.   Patient Denies nocturnal pain.  Difficulty dressing/grooming: Denies Difficulty climbing stairs: Denies Difficulty getting out of chair: Denies Difficulty using hands for taps, buttons, cutlery, and/or writing: Denies  Review of Systems  Constitutional: Negative for fatigue.  HENT: Negative for mouth sores, mouth dryness and nose dryness.   Eyes: Positive for dryness. Negative for pain and itching.  Respiratory: Negative for shortness of breath and difficulty breathing.   Cardiovascular: Negative for chest pain and palpitations.  Gastrointestinal: Negative for blood in stool, constipation and diarrhea.  Endocrine: Negative for increased urination.  Genitourinary: Negative for difficulty urinating.  Musculoskeletal: Negative for arthralgias, joint pain, joint swelling, myalgias, morning stiffness, muscle tenderness and myalgias.  Skin: Negative for color change, rash and redness.  Allergic/Immunologic: Negative for susceptible to infections.  Neurological: Positive for numbness. Negative for dizziness, headaches, memory loss and weakness.  Hematological: Negative for bruising/bleeding  tendency.  Psychiatric/Behavioral: Negative for confusion.    PMFS History:  Patient Active Problem List   Diagnosis Date Noted  . GAD (generalized anxiety disorder) 10/26/2020  . MRSA nasal colonization 10/26/2020  . 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  . Polymyalgia rheumatica ([place]) 10/23/2019  . Spinal stenosis, lumbar region without neurogenic claudication 10/23/2019  . Lumbar facet arthropathy 10/23/2019  . 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 Spondylosis of cervical region without myelopathy or radiculopathy 05/29/2018  . Family history of Parkinson disease 05/21/2016  . Seborrheic dermatitis 11/02/2014  . 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 Moderate episode of recurrent major depressive disorder ([place]) 06/22/2013  . Dermatitis, atopic 01/30/2013  . Menopausal hot flushes 01/30/2013  . MRSA cellulitis 01/30/2013  . 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 Insomnia 03/18/2012  . Hypothyroidism 12/18/2010    Past Medical History:  Diagnosis Date  . Anxiety   . 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 Family history of Parkinson disease 05/21/2016   Mother  . GERD (gastroesophageal reflux disease)   . Kidney failure    blood transfusion  . Moderate episode of recurrent major depressive disorder ([place])   . Osteoporosis   . Polymyalgia rheumatica ([place]) 10/23/2019   Aug 2020, Dr. Estanislado Pandy  . Restless legs syndrome (RLS) 08/02/2017  . 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 Thyroid disease    Hypothyroid    Family History  Problem Relation Age of Onset  . Lupus Mother   . Asthma Mother   . Parkinson's disease Mother   .  Depression Mother   . Heart disease Father   . Hyperlipidemia Father   . Stroke Father   . Alcohol abuse Daughter   . Depression Daughter    Past Surgical History:  Procedure Laterality Date  . APPENDECTOMY  1961  . BREAST  SURGERY Bilateral 1987 1998   Lumpectomy  . EYE SURGERY    . TOTAL ABDOMINAL HYSTERECTOMY  1986   BSO/Fibroids   Social History   Social History Narrative   Drinks 2-3 caffeine drinks a day    Immunization History  Administered Date(s) Administered  . DT (Pediatric) 05/21/2008  . Fluad Quad(high Dose 65+) 04/27/2020  . Influenza Split 06/20/2010, 05/14/2011, 06/18/2012, 06/22/2013, 07/02/2014, 07/05/2015  . Influenza, High Dose Seasonal PF 06/18/2012, 06/18/2012, 06/22/2013, 06/22/2013, 07/02/2014, 07/02/2014, 07/05/2015, 07/05/2015, 05/21/2016, 05/21/2016, 08/02/2017  . Influenza, Seasonal, Injecte, Preservative Fre 05/14/2011  . Influenza,inj,Quad PF,6+ Mos 05/21/2016, 05/29/2018  . Influenza,trivalent, recombinat, inj, PF 05/14/2011  . Influenza-Unspecified 05/14/2011, 06/18/2012, 06/22/2013, 07/02/2014, 07/05/2015  . PFIZER Comirnaty(Gray Top)Covid-19 Tri-Sucrose Vaccine 10/11/2019, 11/04/2019  . PFIZER(Purple Top)SARS-COV-2 Vaccination 10/11/2019, 11/04/2019  . Pneumococcal Conjugate-13 07/04/2014, 07/04/2014  . Pneumococcal Polysaccharide-23 03/20/2005, 06/22/2011  . Pneumococcal-Unspecified 03/20/2005, 06/22/2011  . Td 05/21/2008  . Tdap 06/22/2011, 06/19/2013     Objective: Vital Signs: BP 132/82 (BP Location: Left Arm, Patient Position: Sitting, Cuff Size: Normal)   Pulse 69   Resp 13   Ht 5' 2.75" (1.594 m)   Wt 118 lb 6.4 oz (53.7 kg)   BMI 21.14 kg/m    Physical Exam Vitals and nursing note reviewed.  Constitutional:      Appearance: She is well-developed.  HENT:     Head: Normocephalic and atraumatic.  Eyes:     Conjunctiva/sclera: Conjunctivae normal.  Cardiovascular:     Rate and Rhythm: Normal rate and regular rhythm.     Heart sounds: Normal heart sounds.  Pulmonary:     Effort: Pulmonary effort is normal.     Breath sounds: Normal breath sounds.  Abdominal:     General: Bowel sounds are normal.     Palpations: Abdomen is soft.   Musculoskeletal:     Cervical back: Normal range of motion.  Lymphadenopathy:     Cervical: No cervical adenopathy.  Skin:    General: Skin is warm and dry.     Capillary Refill: Capillary refill takes less than 2 seconds.  Neurological:     Mental Status: She is alert and oriented to person, place, and time.  Psychiatric:        Behavior: Behavior normal.      Musculoskeletal Exam: C-spine, thoracic and lumbar spine with good range of motion.  Shoulder joints, elbow joints, wrist joints with good range of motion.  She had bilateral PIP and DIP thickening with no synovitis.  Hip joints, knee joints, ankles, MTPs and PIPs with good range of motion with no synovitis.  CDAI Exam: CDAI Score: -- Patient Global: --; Provider Global: -- Swollen: --; Tender: -- Joint Exam 11/10/2020   No joint exam has been documented for this visit   There is currently no information documented on the homunculus. Go to the Rheumatology activity and complete the homunculus joint exam.  Investigation: No additional findings.  Imaging: No results found.  Recent Labs: Lab Results  Component Value Date   WBC 4.3 04/19/2020   HGB 12.7 04/19/2020   PLT 239 04/19/2020   NA 135 04/19/2020   K 4.3 04/19/2020   CL 100 04/19/2020   CO2 27 04/19/2020  GLUCOSE 83 04/19/2020   BUN 12 04/19/2020   CREATININE 0.76 04/19/2020   BILITOT 0.7 04/19/2020   ALKPHOS 91 05/14/2019   AST 33 04/19/2020   ALT 25 04/19/2020   PROT 6.2 04/19/2020   ALBUMIN 4.3 05/14/2019   CALCIUM 9.3 04/19/2020   GFRAA 88 04/19/2020   QFTBGOLDPLUS NEGATIVE 07/06/2019   October 25, 2020 DEXA scan right femoral neck T score -2.6 BMD 0.561 no comparison available.  FU with [facility] Comments: No specialty comments available.  Procedures:  No procedures performed Allergies: Patient has no known allergies.   Assessment / Plan:     Visit Diagnoses: Polymyalgia rheumatica ([place]) - ESR was 2 on 04/19/2020.  She has no  increased muscular weakness or tenderness.  She has been active and doing routine activities.  High risk medication use - Patient discontinued Methotrexate on her own in August 2021.  - Plan: COMPLETE METABOLIC PANEL WITH GFR  Primary osteoarthritis of both hands-she has prominent bilateral PIP and DIP thickening.  Joint protection muscle strengthening was discussed.  Spondylosis of cervical region without myelopathy or radiculopathy-she is currently not having much discomfort.  Spondylosis of lumbar region without myelopathy or radiculopathy-she denies pain.  Age-related osteoporosis without current pathological fracture - DEXA 10/25/2020 T-score: -2.6, BMD: 0.561 right femoral neck. DEXA 07/10/18: RFN T-score -2.3. She was a started on Fosamax on December 2020.  She took it for about 8 months and then discontinued herself.  She has been off Fosamax since August 2021.  I discussed DEXA scan findings at length with her.  Use of calcium 1200 mg p.o. daily, which includes dietary plus supplement and vitamin D was discussed.  Indications side effects contraindications of Fosamax were discussed at length.  Handout was placed in the AVS.  She will be starting Fosamax 70 mg p.o. weekly.  We will check labs in 85month.  I will check vitamin D level today as well.  She had vitamin D deficiency in the past.  She states she had recent visit with her dentist which was unremarkable.  Good dental hygiene was discussed.  Daily exercise and resistive exercises were emphasized.- Plan: alendronate (FOSAMAX) 70 MG tablet weekly total 90-day supply with 1 refill was given.  Vitamin D deficiency -she has history of vitamin D deficiency.  I will check vitamin D level today.  Plan: VITAMIN D 25 Hydroxy (Vit-D Deficiency, Fractures)  Other medical problems are listed as follows:  Seborrheic dermatitis  Other insomnia  History of depression  History of hypothyroidism  History of MRSA infection  Family history of  systemic lupus erythematosus (SLE) in mother  Family history of Parkinson disease  Orders: Orders Placed This Encounter  Procedures  . COMPLETE METABOLIC PANEL WITH GFR  . VITAMIN D 25 Hydroxy (Vit-D Deficiency, Fractures)   Meds ordered this encounter  Medications  . alendronate (FOSAMAX) 70 MG tablet    Sig: Take 1 tablet (70 mg total) by mouth once a week. Take with a full glass of water on an empty stomach.    Dispense:  12 tablet    Refill:  1      Follow-Up Instructions: Return in about 6 months (around 05/13/2021) for Osteoporosis, Polymyalgia rheumatica.   SBo Merino MD  Note - This record has been created using DEditor, commissioning  Chart creation errors have been sought, but may not always  have been located. Such creation errors do not reflect on  the standard of medical care.

## 2020-11-09 NOTE — Telephone Encounter (Signed)
Received DEXA results from [facility]  Severe Osteoporosis  Date of Scan: 10/25/2020 Lowest T-score and lowest site measured:-2.6 Right femoral neck, BMD 0.561 Significant changes in BMD and site measured (5% and above):n/a  Current Regimen:Calcium and Vitamin D  Recommendation: Review treatment options, f/u 2 years  Reviewed by: Sherron Ales, PA-C  Next Appointment: 11/10/2020

## 2020-11-10 ENCOUNTER — Encounter: Payer: Self-pay | Admitting: Rheumatology

## 2020-11-10 ENCOUNTER — Other Ambulatory Visit: Payer: Self-pay

## 2020-11-10 ENCOUNTER — Ambulatory Visit: Payer: Medicare Other | Admitting: Rheumatology

## 2020-11-10 VITALS — BP 132/82 | HR 69 | Resp 13 | Ht 62.75 in | Wt 118.4 lb

## 2020-11-10 DIAGNOSIS — M47812 Spondylosis without myelopathy or radiculopathy, cervical region: Secondary | ICD-10-CM | POA: Diagnosis not present

## 2020-11-10 DIAGNOSIS — Z8614 Personal history of Methicillin resistant Staphylococcus aureus infection: Secondary | ICD-10-CM

## 2020-11-10 DIAGNOSIS — M8589 Other specified disorders of bone density and structure, multiple sites: Secondary | ICD-10-CM

## 2020-11-10 DIAGNOSIS — M353 Polymyalgia rheumatica: Secondary | ICD-10-CM

## 2020-11-10 DIAGNOSIS — Z8639 Personal history of other endocrine, nutritional and metabolic disease: Secondary | ICD-10-CM

## 2020-11-10 DIAGNOSIS — G4709 Other insomnia: Secondary | ICD-10-CM

## 2020-11-10 DIAGNOSIS — L219 Seborrheic dermatitis, unspecified: Secondary | ICD-10-CM

## 2020-11-10 DIAGNOSIS — M810 Age-related osteoporosis without current pathological fracture: Secondary | ICD-10-CM

## 2020-11-10 DIAGNOSIS — Z79899 Other long term (current) drug therapy: Secondary | ICD-10-CM | POA: Diagnosis not present

## 2020-11-10 DIAGNOSIS — M47816 Spondylosis without myelopathy or radiculopathy, lumbar region: Secondary | ICD-10-CM

## 2020-11-10 DIAGNOSIS — E559 Vitamin D deficiency, unspecified: Secondary | ICD-10-CM

## 2020-11-10 DIAGNOSIS — Z8269 Family history of other diseases of the musculoskeletal system and connective tissue: Secondary | ICD-10-CM

## 2020-11-10 DIAGNOSIS — Z820 Family history of epilepsy and other diseases of the nervous system: Secondary | ICD-10-CM

## 2020-11-10 DIAGNOSIS — M19042 Primary osteoarthritis, left hand: Secondary | ICD-10-CM

## 2020-11-10 DIAGNOSIS — Z8659 Personal history of other mental and behavioral disorders: Secondary | ICD-10-CM

## 2020-11-10 DIAGNOSIS — M19041 Primary osteoarthritis, right hand: Secondary | ICD-10-CM | POA: Diagnosis not present

## 2020-11-10 LAB — COMPLETE METABOLIC PANEL WITH GFR
AG Ratio: 2.5 (calc) (ref 1.0–2.5)
ALT: 18 U/L (ref 6–29)
AST: 24 U/L (ref 10–35)
Albumin: 4.5 g/dL (ref 3.6–5.1)
Alkaline phosphatase (APISO): 58 U/L (ref 37–153)
BUN: 12 mg/dL (ref 7–25)
CO2: 29 mmol/L (ref 20–32)
Calcium: 9.4 mg/dL (ref 8.6–10.4)
Chloride: 101 mmol/L (ref 98–110)
Creat: 0.71 mg/dL (ref 0.60–0.93)
GFR, Est African American: 95 mL/min/{1.73_m2} (ref >=60)
GFR, Est Non African American: 82 mL/min/{1.73_m2} (ref >=60)
Globulin: 1.8 g/dL (calc) — ABNORMAL LOW (ref 1.9–3.7)
Glucose, Bld: 92 mg/dL (ref 65–99)
Potassium: 4.1 mmol/L (ref 3.5–5.3)
Sodium: 137 mmol/L (ref 135–146)
Total Bilirubin: 0.5 mg/dL (ref 0.2–1.2)
Total Protein: 6.3 g/dL (ref 6.1–8.1)

## 2020-11-10 LAB — VITAMIN D 25 HYDROXY (VIT D DEFICIENCY, FRACTURES): Vit D, 25-Hydroxy: 67 ng/mL (ref 30–100)

## 2020-11-10 MED ORDER — ALENDRONATE SODIUM 70 MG PO TABS: 70.0000 mg | ORAL_TABLET | ORAL | 1 refills | Status: DC

## 2020-11-10 NOTE — Patient Instructions (Signed)
Alendronate Oral Tablets What is this medicine? ALENDRONATE (a LEN droe nate) slows calcium loss from bones. It treats Paget's disease and osteoporosis. It may be used in other people at risk for bone loss. This medicine may be used for other purposes; ask your health care provider or pharmacist if you have questions. COMMON BRAND NAME(S): Fosamax What should I tell my health care provider before I take this medicine? They need to know if you have any of these conditions:  bleeding disorder  cancer  dental disease  difficulty swallowing  infection (fever, chills, cough, sore throat, pain or trouble passing urine)  kidney disease  low levels of calcium or other minerals in the blood  low red blood cell counts  receiving steroids like dexamethasone or prednisone  stomach or intestine problems  trouble sitting or standing for 30 minutes  an unusual or allergic reaction to alendronate, other drugs, foods, dyes or preservatives  pregnant or trying to get pregnant  breast-feeding How should I use this medicine? Take this drug by mouth with a full glass of water. Take it as directed on the prescription label at the same time every day. Take the dose right after waking up. Do not eat or drink anything before taking it. Do not take it with any other drink except water. Do not chew or crush the tablet. After taking it, do not eat breakfast, drink, or take any other drugs or vitamins for at least 30 minutes. Sit or stand up for at least 30 minutes after you take it. Do not lie down. Keep taking it unless your health care provider tells you to stop. A special MedGuide will be given to you by the pharmacist with each prescription and refill. Be sure to read this information carefully each time. Talk to your health care provider about the use of this drug in children. Special care may be needed. Overdosage: If you think you have taken too much of this medicine contact a poison control center  or emergency room at once. NOTE: This medicine is only for you. Do not share this medicine with others. What if I miss a dose? If you take your drug once a day, skip it. Take your next dose at the scheduled time the next morning. Do not take two doses on the same day. If you take your drug once a week, take the missed dose on the morning after you remember. Do not take two doses on the same day. What may interact with this medicine?  aluminum hydroxide  antacids  aspirin  calcium supplements  drugs for inflammation like ibuprofen, naproxen, and others  iron supplements  magnesium supplements  vitamins with minerals This list may not describe all possible interactions. Give your health care provider a list of all the medicines, herbs, non-prescription drugs, or dietary supplements you use. Also tell them if you smoke, drink alcohol, or use illegal drugs. Some items may interact with your medicine. What should I watch for while using this medicine? Visit your health care provider for regular checks on your progress. It may be some time before you see the benefit from this drug. Some people who take this drug have severe bone, joint, or muscle pain. This drug may also increase your risk for jaw problems or a broken thigh bone. Tell your health care provider right away if you have severe pain in your jaw, bones, joints, or muscles. Tell you health care provider if you have any pain that does not go   away or that gets worse. Tell your dentist and dental surgeon that you are taking this drug. You should not have major dental surgery while on this drug. See your dentist to have a dental exam and fix any dental problems before starting this drug. Take good care of your teeth while on this drug. Make sure you see your dentist for regular follow-up appointments. You should make sure you get enough calcium and vitamin D while you are taking this drug. Discuss the foods you eat and the vitamins you  take with your health care provider. You may need blood work done while you are taking this drug. What side effects may I notice from receiving this medicine? Side effects that you should report to your doctor or health care provider as soon as possible  allergic reactions (skin rash, itching or hives; swelling of the face, lips, or tongue)  bone pain  heartburn (burning feeling in chest, often after eating or when lying down)  jaw pain, especially after dental work  joint pain  low calcium levels (fast heartbeat; muscle cramps or pain; pain, tingling, or numbness in the hands or feet; seizures)  muscle pain  painful or difficulty swallowing  redness, blistering, peeling, or loosening of the skin, including inside the mouth  stomach bleeding (bloody or black, tarry stools; spitting up blood or brown material that looks like coffee grounds) Side effects that usually do not require medical attention (report to your doctor or health care provider if they continue or are bothersome):  changes in taste  constipation  diarrhea  nausea This list may not describe all possible side effects. Call your doctor for medical advice about side effects. You may report side effects to FDA at 1-800-FDA-1088. Where should I keep my medicine? Keep out of the reach of children and pets. Store at room temperature between 15 and 30 degrees C (59 and 86 degrees F). Throw away any unused drug after the expiration date. NOTE: This sheet is a summary. It may not cover all possible information. If you have questions about this medicine, talk to your doctor, pharmacist, or health care provider.  2021 Elsevier/Gold Standard (2019-05-07 10:49:07)  

## 2020-11-11 NOTE — Progress Notes (Signed)
CMP and vitamin D are within normal limits.

## 2020-11-27 ENCOUNTER — Encounter: Payer: Self-pay | Admitting: Rheumatology

## 2020-11-28 ENCOUNTER — Other Ambulatory Visit: Payer: Self-pay | Admitting: *Deleted

## 2020-11-28 NOTE — Telephone Encounter (Signed)
Globulin is a protein in her blood.  It is only borderline low-1.8 and has been stable. (1.9 is normal).  She can return to have immunoglobulins checked if she is concerned.

## 2020-11-28 NOTE — Telephone Encounter (Signed)
Please pend SPEP and immunoglobulins.

## 2020-11-29 ENCOUNTER — Other Ambulatory Visit: Payer: Self-pay | Admitting: *Deleted

## 2020-11-29 DIAGNOSIS — M353 Polymyalgia rheumatica: Secondary | ICD-10-CM

## 2020-11-29 DIAGNOSIS — Z79899 Other long term (current) drug therapy: Secondary | ICD-10-CM

## 2020-12-14 ENCOUNTER — Telehealth: Payer: Self-pay

## 2020-12-14 NOTE — Telephone Encounter (Signed)
Is patient having issues?

## 2020-12-14 NOTE — Telephone Encounter (Signed)
Patient is requesting to be seen sooner to recheck her thyroid please advise if any days she can be worked in

## 2020-12-16 NOTE — Telephone Encounter (Signed)
Yes she states she feels off and her weight is fluctuating.

## 2020-12-16 NOTE — Telephone Encounter (Signed)
Can use same day. 

## 2020-12-16 NOTE — Telephone Encounter (Signed)
Patient scheduled.

## 2020-12-26 ENCOUNTER — Other Ambulatory Visit: Payer: Self-pay

## 2020-12-26 ENCOUNTER — Encounter: Payer: Self-pay | Admitting: Family Medicine

## 2020-12-26 ENCOUNTER — Ambulatory Visit: Payer: Medicare Other | Admitting: Family Medicine

## 2020-12-26 VITALS — BP 120/74 | HR 74 | Temp 98.2°F | Ht 63.0 in | Wt 117.4 lb

## 2020-12-26 DIAGNOSIS — L820 Inflamed seborrheic keratosis: Secondary | ICD-10-CM

## 2020-12-26 DIAGNOSIS — E039 Hypothyroidism, unspecified: Secondary | ICD-10-CM | POA: Diagnosis not present

## 2020-12-26 DIAGNOSIS — L659 Nonscarring hair loss, unspecified: Secondary | ICD-10-CM | POA: Diagnosis not present

## 2020-12-26 LAB — TSH: TSH: 0.57 u[IU]/mL (ref 0.35–4.50)

## 2020-12-26 LAB — T4, FREE: Free T4: 1 ng/dL (ref 0.60–1.60)

## 2020-12-26 NOTE — Progress Notes (Signed)
Subjective  CC:  Chief Complaint  Patient presents with  . Hypothyroidism    HPI: Cheryl Austin is a 80 y.o. female who presents to the office today to address the problems listed above in the chief complaint.  80 year old female with hypothyroidism here due to hair thinning.  She reports that she is noticing more hair in the brush after brushing.  No patchy loss.  No scalp lesions.  No symptoms of hyperthyroidism.  She does admit to dry skin.  No weight changes.  No palpitations.  No cold intolerance.  She saw her last TSH was 0.43 and was wondering if her thyroid was low and like it rechecked.  She takes 50 mcg of levothyroxine daily.  Has a lesion on the center of her upper back that she would like checked.  Is mildly tender.  She wants to be sure it is not cancerous.  Has been there for a while.  Hard for her to see.  Wt Readings from Last 3 Encounters:  12/26/20 117 lb 6.4 oz (53.3 kg)  11/10/20 118 lb 6.4 oz (53.7 kg)  10/26/20 118 lb (53.5 kg)   Lab Results  Component Value Date   TSH 0.43 04/27/2020    Assessment  1. Acquired hypothyroidism   2. Seborrheic keratoses, inflamed   3. Hair thinning      Plan   Acquired hypothyroidism: Educated her on TSH levels.  She is clinically euthyroid.  Recheck levels today given hair thinning.  Seborrheic keratoses: Educated on benign nature.  Status post cryotherapy.  Routine post procedure care instructions given.  Follow up: Return for as scheduled.  05/01/2021 for complete physical  Orders Placed This Encounter  Procedures  . T3  . T4, free  . TSH   No orders of the defined types were placed in this encounter.     I reviewed the patients updated PMH, FH, and SocHx.    Patient Active Problem List   Diagnosis Date Noted  . Polymyalgia rheumatica (HCC) 10/23/2019    Priority: High  . Moderate episode of recurrent major depressive disorder (HCC) 06/22/2013    Priority: High  . MRSA cellulitis 01/30/2013     Priority: High  . Hypothyroidism 12/18/2010    Priority: High  . Spinal stenosis, lumbar region without neurogenic claudication 10/23/2019    Priority: Medium  . Spondylosis of cervical region without myelopathy or radiculopathy 05/29/2018    Priority: Medium  . 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 Dermatitis, atopic 01/30/2013    Priority: Medium  . 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 Insomnia 03/18/2012    Priority: Medium  . Family history of Parkinson disease 05/21/2016    Priority: Low  . Seborrheic dermatitis 11/02/2014    Priority: Low  . Menopausal hot flushes 01/30/2013    Priority: Low  . GAD (generalized anxiety disorder) 10/26/2020  . MRSA nasal colonization 10/26/2020  . 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  . Lumbar facet arthropathy 10/23/2019  . 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 alendronate (FOSAMAX) 70 MG tablet Take 1 tablet (70 mg total) by mouth once a week. Take with a full glass of water on an empty stomach.  . betamethasone dipropionate 0.05 % cream APPLY TO AFFECTED AREA TWICE A DAY (Patient taking differently: as needed. APPLY TO AFFECTED AREA TWICE A DAY)  . levothyroxine (SYNTHROID) 50 MCG tablet Take 50 mcg by mouth  daily before breakfast.  . LORazepam (ATIVAN) 0.5 MG tablet Take 1-2 tablets by mouth daily as needed.  . mupirocin ointment (BACTROBAN) 2 % Apply to affected area 1-2 times daily (Patient taking differently: as needed. Apply to affected area 1-2 times daily)  . sertraline (ZOLOFT) 100 MG tablet Take 0.5 tablets (50 mg total) by mouth daily.  . traMADol (ULTRAM) 50 MG tablet Take by mouth every 6 (six) hours as needed.  . traZODone (DESYREL) 100 MG tablet Take 100 mg by mouth as needed for sleep.    Allergies: Patient has No Known Allergies. Family History: Patient family history includes Alcohol abuse in her daughter; Asthma in her mother;  Depression in her daughter and mother; Heart disease in her father; Hyperlipidemia in her father; Lupus in her mother; Parkinson's disease in her mother; Stroke in her father. Social History:  Patient  reports that she has never smoked. She has never used smokeless tobacco. She reports current alcohol use. She reports that she does not use drugs.  Review of Systems: Constitutional: Negative for fever malaise or anorexia Cardiovascular: negative for chest pain Respiratory: negative for SOB or persistent cough Gastrointestinal: negative for abdominal pain  Objective  Vitals: BP 120/74   Pulse 74   Temp 98.2 F (36.8 C) (Temporal)   Ht 5\' 3"  (1.6 m)   Wt 117 lb 6.4 oz (53.3 kg)   SpO2 96%   BMI 20.80 kg/m  General: no acute distress , A&Ox3 Skin:  Warm, clear scalp, central 6 to 7 mm tender seborrheic keratoses on upper mid back.  Cryotherapy Procedure Note  Pre-operative Diagnosis: seb k inflamed  Post-operative Diagnosis: same  Locations: upper mid back  Indications: pain  Anesthesia: none  Procedure Details   Patient informed of risks (permanent scarring, infection, light or dark discoloration, bleeding, infection, weakness, numbness and recurrence of the lesion) and benefits of the procedure and verbal informed consent obtained. Universal time out performed  The areas are treated with liquid nitrogen therapy, frozen until ice ball extended 2 mm beyond lesion, allowed to thaw, and treated again. The patient tolerated procedure well.  The patient was instructed on post-op care, warned that there may be blister formation, redness and pain. Recommend OTC analgesia as needed for pain.  Condition: Stable  Complications: none.    Commons side effects, risks, benefits, and alternatives for medications and treatment plan prescribed today were discussed, and the patient expressed understanding of the given instructions. Patient is instructed to call or message via MyChart if  he/she has any questions or concerns regarding our treatment plan. No barriers to understanding were identified. We discussed Red Flag symptoms and signs in detail. Patient expressed understanding regarding what to do in case of urgent or emergency type symptoms.   Medication list was reconciled, printed and provided to the patient in AVS. Patient instructions and summary information was reviewed with the patient as documented in the AVS. This note was prepared with assistance of Dragon voice recognition software. Occasional wrong-word or sound-a-like substitutions may have occurred due to the inherent limitations of voice recognition software  This visit occurred during the SARS-CoV-2 public health emergency.  Safety protocols were in place, including screening questions prior to the visit, additional usage of staff PPE, and extensive cleaning of exam room while observing appropriate contact time as indicated for disinfecting solutions.

## 2020-12-26 NOTE — Patient Instructions (Signed)
Please follow up as scheduled for your next visit with me: 05/01/2021   If you have any questions or concerns, please don't hesitate to send me a message via MyChart or call the office at [phone]. Thank you for visiting with [place] today! It's our pleasure caring for you.   Seborrheic Keratosis A seborrheic keratosis is a common, noncancerous (benign) skin growth. These growths are velvety, waxy, rough, tan, brown, or black spots that appear on the skin. These skin growths can be flat or raised, and scaly. What are the causes? The cause of this condition is not known. What increases the risk? You are more likely to develop this condition if you:  Have a family history of seborrheic keratosis.  Are 50 or older.  Are pregnant.  Have had estrogen replacement therapy. What are the signs or symptoms? Symptoms of this condition include growths on the face, chest, shoulders, back, or other areas. These growths:  Are usually painless, but may become irritated and itchy.  Can be yellow, brown, black, or other colors.  Are slightly raised or have a flat surface.  Are sometimes rough or wart-like in texture.  Are often velvety or waxy on the surface.  Are round or oval-shaped.  Often occur in groups, but may occur as a single growth.   How is this diagnosed? This condition is diagnosed with a medical history and physical exam.  A sample of the growth may be tested (skin biopsy).  You may need to see a skin specialist (dermatologist). How is this treated? Treatment is not usually needed for this condition, unless the growths are irritated or bleed often.  You may also choose to have the growths removed if you do not like their appearance. ? Most commonly, these growths are treated with a procedure in which liquid nitrogen is applied to "freeze" off the growth (cryosurgery). ? They may also be burned off with electricity (electrocautery) or removed by scraping (curettage). Follow  these instructions at home:  Watch your growth for any changes.  Keep all follow-up visits as told by your health care provider. This is important.  Do not scratch or pick at the growth or growths. This can cause them to become irritated or infected. Contact a health care provider if:  You suddenly have many new growths.  Your growth bleeds, itches, or hurts.  Your growth suddenly becomes larger or changes color. Summary  A seborrheic keratosis is a common, noncancerous (benign) skin growth.  Treatment is not usually needed for this condition, unless the growths are irritated or bleed often.  Watch your growth for any changes.  Contact a health care provider if you suddenly have many new growths or your growth suddenly becomes larger or changes color.  Keep all follow-up visits as told by your health care provider. This is important. This information is not intended to replace advice given to you by your health care provider. Make sure you discuss any questions you have with your health care provider. Document Revised: 12/19/2017 Document Reviewed: 12/19/2017 Elsevier Patient Education  2021 Elsevier Inc.   Cryosurgery for Skin Conditions Cryosurgery, also called cryotherapy, is the use of extremely cold liquid (liquid nitrogen) to freeze and remove abnormal or diseased tissue. Cryosurgery may be used to remove certain growths on the skin, such as:  Warts.  Skin sores that could turn into cancer (precancerous skin lesions or actinic keratoses).  Some skin cancers. Cryosurgery usually takes a few minutes, and it can be done in your  health care provider's office. Tell a health care provider about:  Any allergies you have.  All medicines you are taking, including vitamins, herbs, eye drops, creams, and over-the-counter medicines.  Any problems you or family members have had with anesthetic medicines.  Any blood disorders you have.  Any surgeries you have had.  Any  medical conditions you have.  Whether you are pregnant or may be pregnant. What are the risks? Generally, this is a safe procedure. However, problems may occur, including:  Infection.  Bleeding.  Scarring.  Changes in skin color (lighter or darker than normal skin tone).  Swelling.  Hair loss in the treated area.  Damage to nearby structures or organs, such as nerve damage and loss of feeling. This is rare. What happens before the procedure? No specific preparation is needed for this procedure. Your health care provider will describe the procedure and will discuss the benefits and risks of the procedure with you. What happens during the procedure?  Your procedure will be performed using one of the following methods: ? Your health care provider may apply a device (probe) to the skin. The probe has liquid nitrogen flowing through it to cool it down. The probe will be applied to the skin until the skin is frozen and destroyed. ? Your health care provider may apply liquid nitrogen to the skin with a swab or by spraying it on the skin until the skin is frozen and destroyed.  The treated area may be covered with a bandage (dressing). These procedures may vary among health care providers and clinics.   What can I expect after procedure? After your procedure, it is common to have redness, swelling, and a blister that forms over the treated area. The blister may contain a small amount of blood. You may also have some mild stinging or a burning sensation that will resolve. If a blister forms, it will break open on its own after about 2-4 weeks, leaving a scab. Then the treated area will heal. After healing, there is usually little or no scarring. Follow these instructions at home: Caring for the treated area  Follow instructions from your health care provider about how to take care of the treated area. If you have a dressing, make sure you: ? Wash your hands with soap and water for at least  20 seconds before and after you change your dressing. If soap and water are not available, use hand sanitizer. ? Change your dressing as told by your health care provider. ? Keep the dressing and the treated area clean and dry. If the dressing gets wet, change it right away. ? Clean the treated area with soap and water. ? Keep the area covered with a dressing until it heals, or for as long as told by your health care provider.  Check the treated area every day for signs of infection. Check for: ? More redness, swelling, or pain. ? More fluid or blood. ? Warmth. ? Pus or a bad smell.  If a blister forms, do not pick at your blister or try to break it open. Doing this can cause infection and scarring.  Do not apply any medicine, cream, or lotion to the treated area unless directed by your health care provider.   General instructions  Take over-the-counter and prescription medicines only as told by your health care provider.  Do not use any products that contain nicotine or tobacco, such as cigarettes, e-cigarettes, and chewing tobacco. These can delay healing. If you need  help quitting, ask your health care provider.  Do not take baths, swim, use a hot tub, hand-wash dishes, or otherwise soak the treated area until your health care provider approves. Ask your health care provider if you may take showers. You may only be allowed to take sponge baths.  Keep all follow-up visits as told by your health care provider. This is important. Contact a health care provider if:  You have more redness, swelling, or pain around the treated area.  You have more fluid or blood coming from the treated area.  The treated area feels warm to the touch.  You have pus or a bad smell coming from the treated area.  Your blister becomes large and painful. Get help right away if:  You have a fever and have redness spreading from the treated area. Summary  Cryosurgery, also called cryotherapy, is the use  of extreme cold (liquid nitrogen) to freeze and remove abnormal growths or diseased tissue.  Cryosurgery usually takes a few minutes, and it can be done in your health care provider's office.  Generally, this is a safe procedure that requires no specific preparation beforehand.  There are two different methods for performing cryosurgery. One method involves using a device (probe) to freeze the growth, and the other method involves applying liquid nitrogen directly to the growth.  After treatment with cryotherapy, follow care instructions as provided by your health care provider. Watch for signs of infection. If a blister forms, do not pick at it or try to break it open. This information is not intended to replace advice given to you by your health care provider. Make sure you discuss any questions you have with your health care provider. Document Revised: 03/25/2019 Document Reviewed: 03/25/2019 Elsevier Patient Education  2021 [employer].

## 2020-12-27 LAB — T3: T3, Total: 85 ng/dL (ref 76–181)

## 2021-01-02 ENCOUNTER — Ambulatory Visit: Payer: Medicare Other | Admitting: Family Medicine

## 2021-01-23 ENCOUNTER — Ambulatory Visit: Payer: Medicare Other | Admitting: Family Medicine

## 2021-01-23 ENCOUNTER — Encounter: Payer: Self-pay | Admitting: Family Medicine

## 2021-01-23 ENCOUNTER — Other Ambulatory Visit: Payer: Self-pay

## 2021-01-23 DIAGNOSIS — G8929 Other chronic pain: Secondary | ICD-10-CM

## 2021-01-23 DIAGNOSIS — M545 Low back pain, unspecified: Secondary | ICD-10-CM

## 2021-01-23 NOTE — Progress Notes (Signed)
Office Visit Note   Patient: Cheryl Austin           Date of Birth: 1940-09-15           MRN: [id number] Visit Date: 01/23/2021 Requested by: [name], MD [address] [place],  [place] [address] PCP: [name], MD  Subjective: Chief Complaint  Patient presents with  . Lower Back - Pain    Pain/stiffness in the lower back last week. Went chiropractor and then to [facility] - had 3 injections on each side of her spine. The pain worsened and then got better. Would like to get an opinion on treatment plan at [facility]. Requests guidance on taking meloxicam. Was unable to go to PT before due to family issues, but would like to pursue that now.    HPI: She is here for follow-up chronic low back pain.  Since last visit she was unable to go to physical therapy because her brother in [place] was in an accident and she had to go help him.  Recently she went to [facility] and a physician assistant gave her a $2,000 PRP injection into the lumbar paraspinous muscles without image guidance.  This was the first part of an $8,000 treatment plan.  She had quite a bit of pain afterward, and has not been able to get anybody to return her calls from that facility.  She does not want to complete the rest of her treatment plan.  Pain does not radiate down the legs.  Meloxicam gives her some relief during flareups.              ROS:   All other systems were reviewed and are negative.  Objective: Vital Signs: There were no vitals taken for this visit.  Physical Exam:  General:  Alert and oriented, in no acute distress. Pulm:  Breathing unlabored. Psy:  Normal mood, congruent affect. Skin: No bruising or rash Low back: There is some tenderness in the paraspinous muscles near L5-S1.  Bilateral SI joint tenderness and bilateral gluteus medius tenderness.  Negative straight leg raise bilaterally.  Imaging: No results found.  Assessment & Plan: 1.  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  If they are not able to see her in a reasonable amount of time, she will try Lorenda Peck. - Consider repeat MRI scan if she fails to improve.     Procedures: No procedures performed        PMFS History: Patient Active Problem List   Diagnosis Date Noted  . GAD (generalized anxiety disorder) 10/26/2020  . MRSA nasal colonization 10/26/2020  . 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  . Polymyalgia rheumatica (HCC) 10/23/2019  . Spinal stenosis, lumbar region without neurogenic claudication 10/23/2019  . Lumbar facet arthropathy 10/23/2019  . 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 Spondylosis of cervical region without myelopathy or radiculopathy 05/29/2018  . Family history of Parkinson disease 05/21/2016  . Seborrheic dermatitis 11/02/2014  . 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 Moderate episode of recurrent major depressive disorder (HCC) 06/22/2013  . Dermatitis, atopic 01/30/2013  . Menopausal hot flushes 01/30/2013  . MRSA cellulitis 01/30/2013  . 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 Insomnia 03/18/2012  . Hypothyroidism 12/18/2010   Past Medical History:  Diagnosis Date  . Anxiety   . 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 Family history of Parkinson disease 05/21/2016  Mother  . GERD (gastroesophageal reflux disease)   . Kidney failure    blood transfusion  . Moderate episode of recurrent major depressive disorder (HCC)   . Osteoporosis   . Polymyalgia rheumatica (HCC) 10/23/2019   Aug 2020, Dr. Corliss Skains  . Restless legs syndrome (RLS) 08/02/2017  . 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 Thyroid disease    Hypothyroid    Family History  Problem Relation Age of Onset  . Lupus Mother   . Asthma Mother   . Parkinson's disease Mother   . Depression Mother    . Heart disease Father   . Hyperlipidemia Father   . Stroke Father   . Alcohol abuse Daughter   . Depression Daughter     Past Surgical History:  Procedure Laterality Date  . APPENDECTOMY  1961  . BREAST SURGERY Bilateral 1987 1998   Lumpectomy  . EYE SURGERY    . TOTAL ABDOMINAL HYSTERECTOMY  1986   BSO/Fibroids   Social History   Occupational History  . Occupation: Retired  Tobacco Use  . Smoking status: Never Smoker  . Smokeless tobacco: Never Used  Vaping Use  . Vaping Use: Never used  Substance and Sexual Activity  . Alcohol use: Yes    Comment: couple of times weekly (1 drink)  . Drug use: No  . Sexual activity: Never    Partners: Male    Birth control/protection: Post-menopausal

## 2021-02-07 ENCOUNTER — Ambulatory Visit: Payer: Medicare Other | Attending: Family Medicine | Admitting: Physical Therapy

## 2021-02-07 ENCOUNTER — Encounter: Payer: Self-pay | Admitting: Physical Therapy

## 2021-02-07 ENCOUNTER — Other Ambulatory Visit: Payer: Self-pay

## 2021-02-07 DIAGNOSIS — G8929 Other chronic pain: Secondary | ICD-10-CM | POA: Insufficient documentation

## 2021-02-07 DIAGNOSIS — R252 Cramp and spasm: Secondary | ICD-10-CM | POA: Insufficient documentation

## 2021-02-07 DIAGNOSIS — M6281 Muscle weakness (generalized): Secondary | ICD-10-CM | POA: Diagnosis present

## 2021-02-07 DIAGNOSIS — M545 Low back pain, unspecified: Secondary | ICD-10-CM | POA: Diagnosis present

## 2021-02-07 NOTE — Patient Instructions (Signed)
Access Code: [email or web address] URL: [email or web address] Date: 02/07/2021 Prepared by: Loistine Simas Kalee Broxton  Exercises Supine Hip Internal and External Rotation - 1 x daily - 7 x weekly - 1 sets - 15 reps Supine Diaphragmatic Breathing with Pelvic Floor Lengthening - 1 x daily - 7 x weekly - 2-3 min hold Cat Cow - 1 x daily - 7 x weekly - 1 sets - 10 reps Cat Cow to Child's Pose - 1 x daily - 7 x weekly - 1 sets - 3 reps - 10 hold Child's Pose with Sidebending - 1 x daily - 7 x weekly - 1 sets - 3 reps - 10 hold

## 2021-02-07 NOTE — Therapy (Signed)
[facility] [address] [place] [address], [address] [place], [place], [address] Phone: [phone]   Fax:  [phone]  Physical Therapy Evaluation  Patient Details  Name: Cheryl LundJo Anne Austin MRN: [id number] Date of Birth: 04-26-1941 Referring Provider (PT): M54.50,G89.29 (ICD-10-CM) - Chronic low back pain, unspecified back pain laterality, unspecified whether sciatica present   Encounter Date: 02/07/2021   PT End of Session - 02/07/21 1251     Visit Number 1    Date for PT Re-Evaluation 04/04/21    Authorization Type [employer] Medicare    Progress Note Due on Visit 10    PT Start Time 1145    PT Stop Time 1235    PT Time Calculation (min) 50 min    Activity Tolerance Patient tolerated treatment well    Behavior 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 Corliss Skainseveshwar   Restless legs syndrome (RLS) 08/02/2017   Spinal stenosis, lumbar region without neurogenic claudication 10/23/2019   By lumbar MRI 06/2019, mild   Thyroid disease    Hypothyroid    Past Surgical History:  Procedure Laterality Date   APPENDECTOMY  1961   BREAST SURGERY Bilateral 1987 1998   Lumpectomy   EYE SURGERY     TOTAL ABDOMINAL HYSTERECTOMY  1986   BSO/Fibroids    There were no vitals filed for this visit.    Subjective Assessment - 02/07/21 1147     Subjective Pt is referred to OPPT for low back pain.  Symptoms have been progressing over several years.  Has had several PRP injections without change.  Using pain meds intermittently.   Sitting for brief periods of time reduces the pain and using a lumbar support brace helps tremendously.    Pertinent History PMH: polymyalgia rheumatica, osteoporosis    Limitations Standing;Walking    How long can you stand comfortably? 1-2 hours (sitting improves pain)    How long can you walk comfortably? 1-2 hours    Diagnostic tests lumbar xray 2021L5/S1 mild spondylosis, moderate facet joint arthropathy    Patient Stated Goals be able to stand longer before needing to sit, get rid of pain, get back to gym and learn exercises    Currently in Pain? Yes    Pain Score 3    ranges 0-7   Pain Location Back    Pain Orientation Right;Left;Lower;Mid    Pain Descriptors / Indicators Sore    Pain Type Chronic pain    Pain Radiating Towards across top of pelvis Rt/Lt    Pain Onset More than a month ago    Pain Frequency Intermittent    Aggravating Factors  prolonged standing, walking, tasks around house and in community 1-2 hours    Pain Relieving Factors sitting, use of back brace    Effect of Pain on Daily Activities has to sit when pain increases                [facility] PT Assessment - 02/07/21 0001  Assessment   Medical Diagnosis Hilts, Michael, MD    Referring Provider (PT) M54.50,G89.29 (ICD-10-CM) - Chronic low back pain, unspecified back pain laterality, unspecified whether sciatica present    Onset Date/Surgical Date --   2-3 years   Hand Dominance Right    Next MD Visit as needed    Prior Therapy yes, a long time ago      Precautions   Precautions None    Precaution Comments osteoporosis      Restrictions   Weight Bearing Restrictions No      Balance Screen   Has the patient fallen in the past 6 months No      Home Environment   Living Environment Private residence    Living Arrangements Alone    Type of Home House    Home Access Stairs to enter    Home Layout Two level;Able to live on main level with bedroom/bathroom      Prior Function   Level of  Independence Independent    Vocation Retired    Leisure work around house, garden      Observation/Other Assessments   Focus on Therapeutic Outcomes (FOTO)  52% goal 59%      Functional Tests   Functional tests Single leg stance      Single Leg Stance   Comments good balance and hip stability      Posture/Postural Control   Posture/Postural Control Postural limitations    Postural Limitations Increased thoracic kyphosis;Decreased lumbar lordosis    Posture Comments rigid ribcage      ROM / Strength   AROM / PROM / Strength AROM;PROM;Strength      AROM   Overall AROM  Within functional limits for tasks performed    Overall AROM Comments trunk ROM WNL all planes, relief with flexion and ext of trunk      Strength   Overall Strength Comments bil hips and knees 5/5 throughout      Flexibility   Soft Tissue Assessment /Muscle Length yes   tight diaphragm and abominal guarding     Palpation   Spinal mobility limited thoracic and lumbar PAs throughout, pain with lumbar PAs (superficial tenderness)    SI assessment  WNL, tender    Palpation comment tender spinous processes L3, L4, L5, bil SI joints, lumbar multifidi Lt>Rt      Special Tests    Special Tests Sacrolliac Tests    Sacroiliac Tests  Pelvic Distraction      Pelvic Dictraction   Comment relief      Pelvic Compression   comment relief      Transfers   Transfers Independent with all Transfers      Ambulation/Gait   Gait Pattern Within Functional Limits                        Objective measurements completed on examination: See above findings.                 PT Short Term Goals - 02/07/21 1304       PT SHORT TERM GOAL #1   Title Pt will be ind with initial HEP    Time 3    Period Weeks    Status New    Target Date 02/28/21      PT SHORT TERM GOAL #2   Title Pt will practice spinal decompression and diaphragmatic breathing at least 5 days/week in the afternoon to address  spinal health and abdominal tension.  Time 3    Period Weeks    Status New    Target Date 02/28/21      PT SHORT TERM GOAL #3   Title Pt will demo reduced abdominal tension/improved resting position of the ribcage with ability to perform lateral costal expansion and abdominal breathing to normalize trunk posture and prep for proper core activation.    Time 4    Period Weeks    Status New               PT Long Term Goals - 02/07/21 1308       PT LONG TERM GOAL #1   Title Pt to be independent with final HEP with good understanding of how to transition to gym.    Time 8    Period Weeks    Status New    Target Date 04/04/21      PT LONG TERM GOAL #2   Title Pt will report ability to perform standing activities in house and yard for at least 2 hours with min or no exacerbation of pain.    Time 8    Period Weeks    Status New    Target Date 04/04/21      PT LONG TERM GOAL #3   Title Pt will demo optimal dynamic functional movement for squat, lift, carry using core and good body mechanics to reduce pain with functional tasks.    Time 8    Period Days    Status New    Target Date 04/04/21      PT LONG TERM GOAL #4   Title Pt will report at least 70% reduction in soreness with daily tasks.    Time 8    Period Weeks    Status New    Target Date 04/04/21      PT LONG TERM GOAL #5   Title Pt will improve FOTO score from 52% to 59% to demo improved function.    Time 8    Period Weeks    Status New    Target Date 04/04/21                    Plan - 02/07/21 1239     Clinical Impression Statement Pt is a very pleasant 80yo female with ongoing history of worsening bil LBP over the last few years.  PMH includes osteoporosis.  She is active around her house and yard throughout the day but needs to sit for several minutes every 1-2 hours to reduce pain with standing/walking activities.  Pain is described as constant soreness to the touch, and pain that ranges from  0-7 depending on position and activity.  Lumbar xray shows lumbar arthritis.  Pt has full trunk ROM with relief on lumbar extension and flexion.  She has 5/5 strength of bil LEs.  She is able to balance on Rt/Lt SLS and demos good hip stability in closed chain.  She has excessive thoracic kyphosis and limited thoracic PAs and ribcage mobility.  She carries signif tension in diaphragm and abdominals, limiting costal mobility and affecting posture.  She has signif tenderness to light palpation of lumbar spinous processes L3-L5, bil SI joints, and lumbar multifidi.  She has some spasm present Lt>Rt in lumbar paraspinals and reactive spasm to palpation.  FOTO score is 52% with goal of 59%.  PT introduced intial HEP and discussed plan of care for future appointments.  Pt will benefit from skilled PT to address pain and deficits  to improve overally quality of life and tolerance of daily activities.    Personal Factors and Comorbidities Time since onset of injury/illness/exacerbation    Examination-Activity Limitations Locomotion Level;Stand;Lift;Squat;Carry    Examination-Participation Restrictions Community Activity;Cleaning;Laundry;Yard Work;Meal Prep    Stability/Clinical Decision Making Stable/Uncomplicated    Clinical Decision Making Low    Rehab Potential Excellent    PT Frequency 2x / week    PT Duration 8 weeks    PT Treatment/Interventions ADLs/Self Care Home Management;Electrical Stimulation;Cryotherapy;Moist Heat;Traction;Therapeutic exercise;Neuromuscular re-education;Patient/family education;Manual techniques;Passive range of motion;Dry needling;Functional mobility training    PT Next Visit Plan review HEP, review lateral ribcage expansion breathwork to release ribcage, spinal decompression series for osteoporosis, prone thoracic mobs (gentle given OP), modalities for tenderness/soreness of lumbar spine and soft tissues (stim, heat) - hold DN until less superficial tenderness    PT Home Exercise  Plan Access Code: [email or web address]    Consulted and Agree with Plan of Care Patient             Patient will benefit from skilled therapeutic intervention in order to improve the following deficits and impairments:  Postural dysfunction, Decreased mobility, Hypomobility, Improper body mechanics, Pain, Increased muscle spasms  Visit Diagnosis: Chronic bilateral low back pain without sciatica - Plan: PT plan of care cert/re-cert  Cramp and spasm - Plan: PT plan of care cert/re-cert  Muscle weakness (generalized) - Plan: PT plan of care cert/re-cert     Problem List Patient Active Problem List   Diagnosis Date Noted   GAD (generalized anxiety disorder) 10/26/2020   MRSA nasal colonization 10/26/2020   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   Polymyalgia rheumatica (HCC) 10/23/2019   Spinal stenosis, lumbar region without neurogenic claudication 10/23/2019   Lumbar facet arthropathy 10/23/2019   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 [place], [place], [address] Phone: [phone]   Fax:  [phone]  Name: Brindle Leyba MRN: [id number] Date of Birth: 10/27/1940

## 2021-02-08 ENCOUNTER — Ambulatory Visit: Payer: Medicare Other | Admitting: Family Medicine

## 2021-02-08 ENCOUNTER — Encounter: Payer: Self-pay | Admitting: Family Medicine

## 2021-02-08 VITALS — BP 148/89 | HR 74 | Temp 98.7°F | Ht 63.0 in | Wt 116.4 lb

## 2021-02-08 DIAGNOSIS — R221 Localized swelling, mass and lump, neck: Secondary | ICD-10-CM | POA: Diagnosis not present

## 2021-02-08 NOTE — Progress Notes (Signed)
Subjective  CC:  Chief Complaint  Patient presents with   Neck Pain    Thinks lymph nodes were swollen, has improved. Was painful for about 2 weeks    HPI: Cheryl Austin is a 80 y.o. female who presents to the office today to address the problems listed above in the chief complaint. Patient reports noticing swelling on the left side of her upper neck below her jaw over the last week and a half.  No palpable mass.  She reports mild tenderness.  She was not sure what it was but thinks it lasted for over a week she made this appointment.  However, today her symptoms are gone.  She denies associated ear pain, jaw pain, fevers, chills or neck pain.  She does have osteoarthritis in her neck.  Otherwise feels at her baseline.  Assessment  1. Localized swelling, mass and lump, neck      Plan  Swelling neck, left side: Resolved.  Cannot be certain the cause but a sialolith is on that differential.  Reassured.  No masses palpated today.  Patient to monitor.  If symptoms return, return for evaluation.  TMJ is mildly tender on the left.  Parotid is normal.  No cervical lymphadenopathy on exam today.  Follow up: As scheduled 05/01/2021  No orders of the defined types were placed in this encounter.  No orders of the defined types were placed in this encounter.     I reviewed the patients updated PMH, FH, and SocHx.    Patient Active Problem List   Diagnosis Date Noted   Polymyalgia rheumatica (HCC) 10/23/2019    Priority: High   Moderate episode of recurrent major depressive disorder (HCC) 06/22/2013    Priority: High   MRSA cellulitis 01/30/2013    Priority: High   Hypothyroidism 12/18/2010    Priority: High   Spinal stenosis, lumbar region without neurogenic claudication 10/23/2019    Priority: Medium   Spondylosis of cervical region without myelopathy or radiculopathy 05/29/2018    Priority: Medium   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    Priority: Medium   Dermatitis,  atopic 01/30/2013    Priority: Medium   Osteoarthritis, 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    Priority: Medium   Insomnia 03/18/2012    Priority: Medium   Family history of Parkinson disease 05/21/2016    Priority: Low   Seborrheic dermatitis 11/02/2014    Priority: Low   Menopausal hot flushes 01/30/2013    Priority: Low   GAD (generalized anxiety disorder) 10/26/2020   MRSA nasal colonization 10/26/2020   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   Lumbar facet arthropathy 10/23/2019   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 Take with a full glass of water on an empty stomach.   betamethasone dipropionate 0.05 % cream APPLY TO AFFECTED AREA TWICE A DAY (Patient taking differently: as needed. APPLY TO AFFECTED AREA TWICE A DAY)   levothyroxine (SYNTHROID) 50 MCG tablet Take 50 mcg by mouth daily before breakfast.   LORazepam (ATIVAN) 0.5 MG tablet Take 1-2 tablets by mouth daily as needed.   meloxicam (MOBIC) 15 MG tablet Take 0.5-1 tablets by mouth daily as needed.   mupirocin ointment (BACTROBAN) 2 % Apply to affected area 1-2 times daily (Patient taking differently: as needed. Apply to affected area 1-2 times daily)   sertraline (ZOLOFT) 100 MG tablet Take 0.5 tablets (50  mg total) by mouth daily.   traMADol (ULTRAM) 50 MG tablet Take by mouth every 6 (six) hours as needed.   traZODone (DESYREL) 100 MG tablet Take 100 mg by mouth as needed for sleep.    Allergies: Patient has No Known Allergies. Family History: Patient family history includes Alcohol abuse in her daughter; Asthma in her mother; Depression in her daughter and mother; Heart disease in her father; Hyperlipidemia in her father; Lupus in her mother; Parkinson's disease in her mother; Stroke in her father. Social History:  Patient  reports that she has never smoked. She has never used smokeless tobacco. She reports  current alcohol use. She reports that she does not use drugs.  Review of Systems: Constitutional: Negative for fever malaise or anorexia Cardiovascular: negative for chest pain Respiratory: negative for SOB or persistent cough Gastrointestinal: negative for abdominal pain  Objective  Vitals: BP (!) 148/89   Pulse 74   Temp 98.7 F (37.1 C) (Temporal)   Ht 5\' 3"  (1.6 m)   Wt 116 lb 6.4 oz (52.8 kg)   SpO2 97%   BMI 20.62 kg/m  General: no acute distress , A&Ox3 HEENT: PEERL, conjunctiva normal, neck is supple, no masses palpated in the neck.  No visualized swelling.  Normal oropharynx, TMs normal.  Left TMJ click present. Cardiovascular:  RRR without murmur or gallop.  Respiratory:  Good breath sounds bilaterally, CTAB with normal respiratory effort Skin:  Warm, no rashes    Commons side effects, risks, benefits, and alternatives for medications and treatment plan prescribed today were discussed, and the patient expressed understanding of the given instructions. Patient is instructed to call or message via MyChart if he/she has any questions or concerns regarding our treatment plan. No barriers to understanding were identified. We discussed Red Flag symptoms and signs in detail. Patient expressed understanding regarding what to do in case of urgent or emergency type symptoms.  Medication list was reconciled, printed and provided to the patient in AVS. Patient instructions and summary information was reviewed with the patient as documented in the AVS. This note was prepared with assistance of Dragon voice recognition software. Occasional wrong-word or sound-a-like substitutions may have occurred due to the inherent limitations of voice recognition software  This visit occurred during the SARS-CoV-2 public health emergency.  Safety protocols were in place, including screening questions prior to the visit, additional usage of staff PPE, and extensive cleaning of exam room while observing  appropriate contact time as indicated for disinfecting solutions.

## 2021-02-09 ENCOUNTER — Ambulatory Visit: Payer: Medicare Other | Admitting: Physical Therapy

## 2021-02-09 ENCOUNTER — Other Ambulatory Visit: Payer: Self-pay

## 2021-02-09 DIAGNOSIS — M545 Low back pain, unspecified: Secondary | ICD-10-CM | POA: Diagnosis not present

## 2021-02-09 DIAGNOSIS — R252 Cramp and spasm: Secondary | ICD-10-CM

## 2021-02-09 DIAGNOSIS — G8929 Other chronic pain: Secondary | ICD-10-CM

## 2021-02-09 DIAGNOSIS — M6281 Muscle weakness (generalized): Secondary | ICD-10-CM

## 2021-02-09 NOTE — Patient Instructions (Signed)
Supine Chest Stretch on Foam Roll - 1 x daily - 7 x weekly - 1 sets - 2 reps - 20s hold [employer] on Foam Roll - 1 x daily - 7 x weekly - 1 sets - 10 reps Thoracic Foam Roll Mobilization Bench Press - 1 x daily - 7 x weekly - 1 sets - 10 reps

## 2021-02-09 NOTE — Therapy (Signed)
[facility] [address] [place], [place], [address] Phone: [phone]   Fax:  [phone]  Physical Therapy Treatment  Patient Details  Name: Cheryl Austin MRN: [id number] Date of Birth: February 02, 1941 Referring Provider (PT): M54.50,G89.29 (ICD-10-CM) - Chronic low back pain, unspecified back pain laterality, unspecified whether sciatica present   Encounter Date: 02/09/2021   PT End of Session - 02/09/21 0945     Visit Number 2    Date for PT Re-Evaluation 04/04/21    Authorization Type [employer] Medicare    Progress Note Due on Visit 10    PT Start Time 0847    PT Stop Time 0925    PT Time Calculation (min) 38 min    Activity Tolerance Patient tolerated treatment well    Behavior During Therapy [facility] for tasks assessed/performed             Past Medical History:  Diagnosis Date   Anxiety    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 Corliss Skains   Restless legs syndrome (RLS) 08/02/2017   Spinal stenosis, lumbar region without neurogenic claudication 10/23/2019   By lumbar MRI 06/2019, mild   Thyroid disease    Hypothyroid    Past Surgical History:  Procedure Laterality Date   APPENDECTOMY  1961   BREAST SURGERY Bilateral 1987 1998   Lumpectomy   EYE SURGERY     TOTAL ABDOMINAL HYSTERECTOMY  1986   BSO/Fibroids    There were no vitals filed for this visit.                      [facility] Adult PT Treatment/Exercise - 02/09/21 0001       Exercises   Exercises Lumbar;Shoulder      Lumbar Exercises: Supine   Other Supine Lumbar Exercises diaphragmatic breathing, x10       Lumbar Exercises: Quadruped   Madcat/Old Horse 10 reps    Madcat/Old Horse Limitations into child's pose x5    Other Quadruped Lumbar Exercises child's pose with reach x5 Lt/Rt      Shoulder Exercises: Supine   Horizontal ABduction 10 reps;Theraband    Theraband Level (Shoulder Horizontal ABduction) Level 1 (Yellow)    Horizontal ABduction Limitations towel roll between shoulder blades    Diagonals 10 reps;Theraband    Theraband Level (Shoulder Diagonals) Level 1 (Yellow)    Diagonals Limitations x10 Lt/Rt on towel roll    Other Supine Exercises chest press thoracic mob on towel roll x10    Other Supine Exercises snow angels on towel roll x10      Manual Therapy   Manual Therapy Soft tissue mobilization;Joint mobilization    Joint Mobilization PAs Grade 1-2 T7-L5    Soft tissue mobilization bilateral STM lumbar and thoracic paraspinals, erector spinae, quadratus                    PT Education - 02/09/21 0933     Education Details supine chest stretch on towel roll; snow angels on towel roll; bench press on towel roll for thoracic mob    Person(s) Educated Patient    Methods  Explanation;Demonstration;Tactile cues;Verbal cues;Handout    Comprehension Verbalized understanding;Returned demonstration;Verbal cues required;Tactile cues required              PT Short Term Goals - 02/09/21 0943       PT SHORT TERM GOAL #1   Title Pt will be ind with initial HEP    Time 3    Period Weeks    Status On-going    Target Date 02/28/21               PT Long Term Goals - 02/07/21 1308       PT LONG TERM GOAL #1   Title Pt to be independent with final HEP with good understanding of how to transition to gym.    Time 8    Period Weeks    Status New    Target Date 04/04/21      PT LONG TERM GOAL #2   Title Pt will report ability to perform standing activities in house and yard for at least 2 hours with min or no exacerbation of pain.    Time 8    Period Weeks     Status New    Target Date 04/04/21      PT LONG TERM GOAL #3   Title Pt will demo optimal dynamic functional movement for squat, lift, carry using core and good body mechanics to reduce pain with functional tasks.    Time 8    Period Days    Status New    Target Date 04/04/21      PT LONG TERM GOAL #4   Title Pt will report at least 70% reduction in soreness with daily tasks.    Time 8    Period Weeks    Status New    Target Date 04/04/21      PT LONG TERM GOAL #5   Title Pt will improve FOTO score from 52% to 59% to demo improved function.    Time 8    Period Weeks    Status New    Target Date 04/04/21                   Plan - 02/09/21 0934     Clinical Impression Statement Patient reporting 0/10 pain at end of session. Patient states that she enjoyed towel roll exercises this date. Verbal cuing provided for eccentric adduction control when performing supine horizontal abduction and diagonals indicating need for continued strengthening. Would benefit from continued skilled intervention for decreased pain and improved functional activity tolerance.    Personal Factors and Comorbidities Time since onset of injury/illness/exacerbation    Examination-Activity Limitations Locomotion Level;Stand;Lift;Squat;Carry    Examination-Participation Restrictions Community Activity;Cleaning;Laundry;Yard Work;Meal Prep    Rehab Potential Excellent    PT Frequency 2x / week    PT Duration 8 weeks    PT Treatment/Interventions ADLs/Self Care Home Management;Electrical Stimulation;Cryotherapy;Moist Heat;Traction;Therapeutic exercise;Neuromuscular re-education;Patient/family education;Manual techniques;Passive range of motion;Dry needling;Functional mobility training;Spinal Manipulations    PT Next Visit Plan review new HEP exercises; continue mobilization (avoid excessive rotation at this time); continue manual and modalities as needed    PT Home Exercise Plan Access Code: [email or web address]     Consulted and Agree with Plan of Care Patient             Patient will benefit from skilled therapeutic intervention in order to improve the following deficits and impairments:  Postural dysfunction, Decreased mobility, Hypomobility, Improper body mechanics, Pain, Increased muscle spasms  Visit Diagnosis: Chronic  bilateral low back pain without sciatica  Cramp and spasm  Muscle weakness (generalized)     Problem List Patient Active Problem List   Diagnosis Date Noted   GAD (generalized anxiety disorder) 10/26/2020   MRSA nasal colonization 10/26/2020   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   Polymyalgia rheumatica (HCC) 10/23/2019   Spinal stenosis, lumbar region without neurogenic claudication 10/23/2019   Lumbar facet arthropathy 10/23/2019   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 [place], [place], [address] Phone: [phone]   Fax:  [phone]  Name: Lexani Corona MRN: [id number] Date of Birth: [birth year]

## 2021-02-15 ENCOUNTER — Ambulatory Visit: Payer: Medicare Other | Admitting: Physical Therapy

## 2021-02-15 ENCOUNTER — Other Ambulatory Visit: Payer: Self-pay

## 2021-02-15 DIAGNOSIS — M6281 Muscle weakness (generalized): Secondary | ICD-10-CM

## 2021-02-15 DIAGNOSIS — R252 Cramp and spasm: Secondary | ICD-10-CM

## 2021-02-15 DIAGNOSIS — M545 Low back pain, unspecified: Secondary | ICD-10-CM | POA: Diagnosis not present

## 2021-02-15 DIAGNOSIS — G8929 Other chronic pain: Secondary | ICD-10-CM

## 2021-02-15 NOTE — Therapy (Signed)
[facility] [address] [place], [place], [address] Phone: [phone]   Fax:  [phone]  Physical Therapy Treatment  Patient Details  Name: Cheryl Austin MRN: [id number] Date of Birth: 1941-06-19 Referring Provider (PT): M54.50,G89.29 (ICD-10-CM) - Chronic low back pain, unspecified back pain laterality, unspecified whether sciatica present   Encounter Date: 02/15/2021   PT End of Session - 02/15/21 1100     Visit Number 3    Date for PT Re-Evaluation 04/04/21    Authorization Type [employer] Medicare    Progress Note Due on Visit 10    PT Start Time 0935   extended time at check in   PT Stop Time 1014    PT Time Calculation (min) 39 min    Activity Tolerance Patient tolerated treatment well    Behavior During Therapy [facility] for tasks assessed/performed             Past Medical History:  Diagnosis Date   Anxiety    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 Corliss Skains   Restless legs syndrome (RLS) 08/02/2017   Spinal stenosis, lumbar region without neurogenic claudication 10/23/2019   By lumbar MRI 06/2019, mild   Thyroid disease    Hypothyroid    Past Surgical History:  Procedure Laterality Date   APPENDECTOMY  1961   BREAST SURGERY Bilateral 1987 1998   Lumpectomy   EYE SURGERY     TOTAL ABDOMINAL HYSTERECTOMY  1986   BSO/Fibroids    There were no vitals filed for this visit.   Subjective Assessment - 02/15/21 0937     Subjective Has not noticed much difference. Feels about the same.    Pertinent History PMH: polymyalgia rheumatica, osteoporosis    Limitations Standing;Walking     How long can you stand comfortably? 1-2 hours (sitting improves pain)    How long can you walk comfortably? 1-2 hours    Diagnostic tests lumbar xray 2021L5/S1 mild spondylosis, moderate facet joint arthropathy    Patient Stated Goals be able to stand longer before needing to sit, get rid of pain, get back to gym and learn exercises    Currently in Pain? Yes    Pain Score 3     Pain Location Back    Pain Orientation Right;Left;Lower;Mid    Pain Descriptors / Indicators Sore                               [facility] Adult PT Treatment/Exercise - 02/15/21 0001       Neuro Re-ed    Neuro Re-ed Details  diaphragmatic breathing x15 reps; VC and TC for palpating diaphragm      Lumbar Exercises: Standing   Other Standing Lumbar Exercises anti rotation press; red tband; x10 Lt/Rt      Lumbar Exercises: Supine   AB Set Limitations 2 x 10 reps; VC/TC for palpation of abdominals to avoid valsalva manuever      Lumbar Exercises: Quadruped   Madcat/Old Horse 10 reps    Madcat/Old Horse Limitations into child's pose x5      Shoulder  Exercises: Supine   Horizontal ABduction 10 reps;Theraband    Theraband Level (Shoulder Horizontal ABduction) Level 1 (Yellow);Level 2 (Red)    Horizontal ABduction Limitations towel roll between scapulas    External Rotation Both;10 reps;Theraband    Theraband Level (Shoulder External Rotation) Level 2 (Red)    External Rotation Weight (lbs) towel roll between scapulas    Theraband Level (Shoulder Diagonals) Level 2 (Red)    Diagonals Weight (lbs) x10 Lt/Rt; towel roll between scapulas      Shoulder Exercises: ROM/Strengthening   Lat Pull Limitations green tband over door; Lt/Rt x10 reps      Manual Therapy   Soft tissue mobilization bilateral STM lumbar and thoracic paraspinals, erector spinae, quadratus                      PT Short Term Goals - 02/15/21 1100       PT SHORT TERM GOAL #1   Title Pt will be ind with initial  HEP    Time 3    Period Weeks    Status On-going    Target Date 02/28/21      PT SHORT TERM GOAL #2   Title Pt will practice spinal decompression and diaphragmatic breathing at least 5 days/week in the afternoon to address spinal health and abdominal tension.    Time 3    Period Weeks    Status On-going               PT Long Term Goals - 02/07/21 1308       PT LONG TERM GOAL #1   Title Pt to be independent with final HEP with good understanding of how to transition to gym.    Time 8    Period Weeks    Status New    Target Date 04/04/21      PT LONG TERM GOAL #2   Title Pt will report ability to perform standing activities in house and yard for at least 2 hours with min or no exacerbation of pain.    Time 8    Period Weeks    Status New    Target Date 04/04/21      PT LONG TERM GOAL #3   Title Pt will demo optimal dynamic functional movement for squat, lift, carry using core and good body mechanics to reduce pain with functional tasks.    Time 8    Period Days    Status New    Target Date 04/04/21      PT LONG TERM GOAL #4   Title Pt will report at least 70% reduction in soreness with daily tasks.    Time 8    Period Weeks    Status New    Target Date 04/04/21      PT LONG TERM GOAL #5   Title Pt will improve FOTO score from 52% to 59% to demo improved function.    Time 8    Period Weeks    Status New    Target Date 04/04/21                   Plan - 02/15/21 1057     Clinical Impression Statement Patient requiring max verbal and tactile cuing for proper performance of diaphragmatic breathing. Tactile cues provided for eccentric control when performing supine shoulder external rotation. Would benefit from continued skilled therapeutic intervention for improved functional activity tolerance.    Personal Factors and Comorbidities Time since onset  of injury/illness/exacerbation    Examination-Activity Limitations Locomotion  Level;Stand;Lift;Squat;Carry    Examination-Participation Restrictions Psychiatric nurse;Yard Work;Meal Prep    Rehab Potential Excellent    PT Frequency 2x / week    PT Duration 8 weeks    PT Treatment/Interventions ADLs/Self Care Home Management;Electrical Stimulation;Cryotherapy;Moist Heat;Traction;Therapeutic exercise;Neuromuscular re-education;Patient/family education;Manual techniques;Passive range of motion;Dry needling;Functional mobility training;Spinal Manipulations    PT Next Visit Plan continue mobilization; gentle postural strengthening; continue education and awareness with regards to thoracic alignment; dry needling    PT Home Exercise Plan Access Code: [email or web address]    Consulted and Agree with Plan of Care Patient             Patient will benefit from skilled therapeutic intervention in order to improve the following deficits and impairments:  Postural dysfunction, Decreased mobility, Hypomobility, Improper body mechanics, Pain, Increased muscle spasms  Visit Diagnosis: Chronic bilateral low back pain without sciatica  Cramp and spasm  Muscle weakness (generalized)     Problem List Patient Active Problem List   Diagnosis Date Noted   GAD (generalized anxiety disorder) 10/26/2020   MRSA nasal colonization 10/26/2020   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   Polymyalgia rheumatica (HCC) 10/23/2019   Spinal stenosis, lumbar region without neurogenic claudication 10/23/2019   Lumbar facet arthropathy 10/23/2019   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 [place], [place], [address] Phone: [phone]   Fax:  [phone]  Name: Cheryl Austin MRN: [id number] Date of Birth: 1941-05-16

## 2021-02-17 ENCOUNTER — Other Ambulatory Visit: Payer: Self-pay

## 2021-02-17 ENCOUNTER — Ambulatory Visit: Payer: Medicare Other | Attending: Family Medicine | Admitting: Physical Therapy

## 2021-02-17 DIAGNOSIS — M6281 Muscle weakness (generalized): Secondary | ICD-10-CM | POA: Insufficient documentation

## 2021-02-17 DIAGNOSIS — G8929 Other chronic pain: Secondary | ICD-10-CM

## 2021-02-17 DIAGNOSIS — R252 Cramp and spasm: Secondary | ICD-10-CM

## 2021-02-17 DIAGNOSIS — M545 Low back pain, unspecified: Secondary | ICD-10-CM | POA: Insufficient documentation

## 2021-02-17 NOTE — Therapy (Signed)
[facility] [address] [place], [place], [address] Phone: [phone]   Fax:  [phone]  Physical Therapy Treatment  Patient Details  Name: Cheryl Austin MRN: [id number] Date of Birth: [birth year] Referring Provider (PT): M54.50,G89.29 (ICD-10-CM) - Chronic low back pain, unspecified back pain laterality, unspecified whether sciatica present   Encounter Date: 02/17/2021   PT End of Session - 02/17/21 1152     Visit Number 4    Date for PT Re-Evaluation 04/04/21    Authorization Type [employer] Medicare    Progress Note Due on Visit 10    PT Start Time 1103    PT Stop Time 1141    PT Time Calculation (min) 38 min    Activity Tolerance Patient tolerated treatment well    Behavior During Therapy [facility] for tasks assessed/performed             Past Medical History:  Diagnosis Date   Anxiety    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 Corliss Skains   Restless legs syndrome (RLS) 08/02/2017   Spinal stenosis, lumbar region without neurogenic claudication 10/23/2019   By lumbar MRI 06/2019, mild   Thyroid disease    Hypothyroid    Past Surgical History:  Procedure Laterality Date   APPENDECTOMY  1961   BREAST SURGERY Bilateral 1987 1998   Lumpectomy   EYE SURGERY     TOTAL ABDOMINAL HYSTERECTOMY  1986   BSO/Fibroids    There were no vitals filed for this visit.   Subjective Assessment - 02/17/21 1148     Subjective Continues to feel the same.    Pertinent History PMH: polymyalgia rheumatica, osteoporosis    Limitations Standing;Walking    How long can you stand comfortably? 1-2 hours (sitting  improves pain)    How long can you walk comfortably? 1-2 hours    Diagnostic tests lumbar xray 2021L5/S1 mild spondylosis, moderate facet joint arthropathy    Patient Stated Goals be able to stand longer before needing to sit, get rid of pain, get back to gym and learn exercises    Currently in Pain? Yes    Pain Score 3     Pain Location Back    Pain Orientation Lower;Mid                               [facility] Adult PT Treatment/Exercise - 02/17/21 0001       Neuro Re-ed    Neuro Re-ed Details  diaphragmatic breathing x10 repetitions   towel roll placed horizontally at scapular level     Lumbar Exercises: Supine   Ab Set 5 reps;5 seconds    AB Set Limitations ab set with march, 2x5 reps Lt/Rt      Lumbar Exercises: Quadruped   Madcat/Old Horse 5 reps    Madcat/Old Horse Limitations child's pose 3 reps x10s hold    Single Arm Raise Right;Left;5 reps;2 seconds    Straight Leg Raises Limitations x5 reps Lt/Rt, VC for LE extension and neutral spine      Shoulder Exercises: Supine   Horizontal ABduction Theraband;12  reps    Theraband Level (Shoulder Horizontal ABduction) Level 2 (Red)    Horizontal ABduction Limitations horizontal towel roll at scapular level    External Rotation Both;12 reps;Theraband    Theraband Level (Shoulder External Rotation) Level 2 (Red)    External Rotation Weight (lbs) horizontal towel roll at scapular level    Diagonals Right;Left;12 reps    Theraband Level (Shoulder Diagonals) Level 2 (Red)    Diagonals Weight (lbs) horizontal towel roll at scapular level      Manual Therapy   Manual Therapy Soft tissue mobilization;Joint mobilization    Joint Mobilization PAs Grade 1-2 T7-L5    Soft tissue mobilization bilateral STM lumbar and thoracic paraspinals, erector spinae, quadratus                      PT Short Term Goals - 02/17/21 1151       PT SHORT TERM GOAL #3   Title Pt will demo reduced abdominal tension/improved  resting position of the ribcage with ability to perform lateral costal expansion and abdominal breathing to normalize trunk posture and prep for proper core activation.    Time 4    Period Weeks    Status On-going               PT Long Term Goals - 02/07/21 1308       PT LONG TERM GOAL #1   Title Pt to be independent with final HEP with good understanding of how to transition to gym.    Time 8    Period Weeks    Status New    Target Date 04/04/21      PT LONG TERM GOAL #2   Title Pt will report ability to perform standing activities in house and yard for at least 2 hours with min or no exacerbation of pain.    Time 8    Period Weeks    Status New    Target Date 04/04/21      PT LONG TERM GOAL #3   Title Pt will demo optimal dynamic functional movement for squat, lift, carry using core and good body mechanics to reduce pain with functional tasks.    Time 8    Period Days    Status New    Target Date 04/04/21      PT LONG TERM GOAL #4   Title Pt will report at least 70% reduction in soreness with daily tasks.    Time 8    Period Weeks    Status New    Target Date 04/04/21      PT LONG TERM GOAL #5   Title Pt will improve FOTO score from 52% to 59% to demo improved function.    Time 8    Period Weeks    Status New    Target Date 04/04/21                   Plan - 02/17/21 1149     Clinical Impression Statement Patient reporting 0/10 pain at end of session. Initially wanting dry needling but reporting increased pain following first attempt thus this therapist educating patient on other soft tissue options with patient in agreement. Patient demonstrates improved eccenctric control of scapular and glenohumeral stabilizers with supine exercises this date. Verbal and tactile cues provided for neutral spine when performing UE and LE raises in quadruped. Would benefit from continued skilled therapeutic intervention for improved functional activity tolerance.  Personal Factors and Comorbidities Time since onset of injury/illness/exacerbation    Examination-Activity Limitations Locomotion Level;Stand;Lift;Squat;Carry    Examination-Participation Restrictions Community Activity;Cleaning;Laundry;Yard Work;Meal Prep    Rehab Potential Excellent    PT Frequency 2x / week    PT Duration 8 weeks    PT Treatment/Interventions ADLs/Self Care Home Management;Electrical Stimulation;Cryotherapy;Moist Heat;Traction;Therapeutic exercise;Neuromuscular re-education;Patient/family education;Manual techniques;Passive range of motion;Dry needling;Functional mobility training;Spinal Manipulations    PT Next Visit Plan progress postural strengthening; continue focus on spinal decompression; core strengthening with focus on maintaining neutral thoracic and lumbar spine; STM and other manual    PT Home Exercise Plan Access Code: [email or web address]    Consulted and Agree with Plan of Care Patient             Patient will benefit from skilled therapeutic intervention in order to improve the following deficits and impairments:  Postural dysfunction, Decreased mobility, Hypomobility, Improper body mechanics, Pain, Increased muscle spasms  Visit Diagnosis: Chronic bilateral low back pain without sciatica  Cramp and spasm  Muscle weakness (generalized)     Problem List Patient Active Problem List   Diagnosis Date Noted   GAD (generalized anxiety disorder) 10/26/2020   MRSA nasal colonization 10/26/2020   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   Polymyalgia rheumatica (HCC) 10/23/2019   Spinal stenosis, lumbar region without neurogenic claudication 10/23/2019   Lumbar facet arthropathy 10/23/2019   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 [place], [place], [address] Phone: [phone]   Fax:  [phone]  Name: Cheryl Austin MRN: [id number] Date of Birth: May 03, 1941

## 2021-02-22 ENCOUNTER — Other Ambulatory Visit: Payer: Self-pay

## 2021-02-22 ENCOUNTER — Ambulatory Visit: Payer: Medicare Other | Admitting: Physical Therapy

## 2021-02-22 DIAGNOSIS — M545 Low back pain, unspecified: Secondary | ICD-10-CM | POA: Diagnosis not present

## 2021-02-22 DIAGNOSIS — M6281 Muscle weakness (generalized): Secondary | ICD-10-CM

## 2021-02-22 DIAGNOSIS — G8929 Other chronic pain: Secondary | ICD-10-CM

## 2021-02-22 DIAGNOSIS — R252 Cramp and spasm: Secondary | ICD-10-CM

## 2021-02-22 NOTE — Therapy (Signed)
[facility] [address] [place], [place], [address] Phone: [phone]   Fax:  [phone]  Physical Therapy Treatment  Patient Details  Name: Cheryl Austin MRN: [id number] Date of Birth: 08-15-1941 Referring Provider (PT): M54.50,G89.29 (ICD-10-CM) - Chronic low back pain, unspecified back pain laterality, unspecified whether sciatica present   Encounter Date: 02/22/2021   PT End of Session - 02/22/21 0941     Visit Number 5    Date for PT Re-Evaluation 04/04/21    Authorization Type [employer] Medicare    Progress Note Due on Visit 10    PT Start Time [id number]    PT Stop Time 0930    PT Time Calculation (min) 39 min    Activity Tolerance Patient tolerated treatment well    Behavior During Therapy [facility] for tasks assessed/performed             Past Medical History:  Diagnosis Date   Anxiety    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 Corliss Skains   Restless legs syndrome (RLS) 08/02/2017   Spinal stenosis, lumbar region without neurogenic claudication 10/23/2019   By lumbar MRI 06/2019, mild   Thyroid disease    Hypothyroid    Past Surgical History:  Procedure Laterality Date   APPENDECTOMY  1961   BREAST SURGERY Bilateral 1987 1998   Lumpectomy   EYE SURGERY     TOTAL ABDOMINAL HYSTERECTOMY  1986   BSO/Fibroids    There were no vitals filed for this visit.   Subjective Assessment - 02/22/21 0936     Subjective Patient initially reporting that she feels the same as previous session. Later stating that she noted having no back pain yesterday for the entire day.    Pertinent History PMH:  polymyalgia rheumatica, osteoporosis    Limitations Standing;Walking    How long can you stand comfortably? 1-2 hours (sitting improves pain)    How long can you walk comfortably? 1-2 hours    Diagnostic tests lumbar xray 2021L5/S1 mild spondylosis, moderate facet joint arthropathy    Patient Stated Goals be able to stand longer before needing to sit, get rid of pain, get back to gym and learn exercises    Currently in Pain? Yes    Pain Score 3     Pain Location Back    Pain Orientation Lower;Mid    Pain Descriptors / Indicators Sore;Tightness    Pain Type Chronic pain    Pain Onset More than a month ago                               [facility] Adult PT Treatment/Exercise - 02/22/21 0001       Lumbar Exercises: Stretches   Double Knee to Chest Stretch 1 rep;30 seconds    Other Lumbar Stretch Exercise seated lumbar flexion with segmental roll up x5 repetitions    Other Lumbar Stretch Exercise standing child's pose, 5 x 10s hold; standing childs pose with reach, 1 x 10s hold Lt/Rt      Lumbar Exercises: Standing   Row Power tower;Both;10 reps  Row Limitations 20#; seated on green ball; VC for decreased levator and upper trap activation    Shoulder Extension Power Tower;Both;10 reps    Shoulder Extension Limitations 15#; seated on green ball    Other Standing Lumbar Exercises anti rotation press; green tband; x15 Lt/Rt    Other Standing Lumbar Exercises seated on green ball; UE raise into Y; yellow tband x 10      Lumbar Exercises: Supine   AB Set Limitations ab set with 90/90 heel tap; x10 Lt/Rt      Lumbar Exercises: Quadruped   Madcat/Old Horse 10 reps    Straight Leg Raise 10 reps;3 seconds    Straight Leg Raises Limitations multimodal cues for pelvic stability and neutral spine when extending Lt LE                    PT Education - 02/22/21 0937     Education Details anti rotation press, seated flexion stretch    Person(s) Educated Patient     Methods Explanation;Demonstration;Tactile cues;Verbal cues;Handout    Comprehension Verbalized understanding;Returned demonstration;Verbal cues required;Tactile cues required              PT Short Term Goals - 02/22/21 0940       PT SHORT TERM GOAL #1   Title Pt will be ind with initial HEP    Time 3    Period Weeks    Status On-going    Target Date 02/28/21      PT SHORT TERM GOAL #2   Title Pt will practice spinal decompression and diaphragmatic breathing at least 5 days/week in the afternoon to address spinal health and abdominal tension.    Time 3    Period Weeks    Status On-going      PT SHORT TERM GOAL #3   Title Pt will demo reduced abdominal tension/improved resting position of the ribcage with ability to perform lateral costal expansion and abdominal breathing to normalize trunk posture and prep for proper core activation.    Time 4    Period Weeks    Status On-going               PT Long Term Goals - 02/07/21 1308       PT LONG TERM GOAL #1   Title Pt to be independent with final HEP with good understanding of how to transition to gym.    Time 8    Period Weeks    Status New    Target Date 04/04/21      PT LONG TERM GOAL #2   Title Pt will report ability to perform standing activities in house and yard for at least 2 hours with min or no exacerbation of pain.    Time 8    Period Weeks    Status New    Target Date 04/04/21      PT LONG TERM GOAL #3   Title Pt will demo optimal dynamic functional movement for squat, lift, carry using core and good body mechanics to reduce pain with functional tasks.    Time 8    Period Days    Status New    Target Date 04/04/21      PT LONG TERM GOAL #4   Title Pt will report at least 70% reduction in soreness with daily tasks.    Time 8    Period Weeks    Status New    Target Date 04/04/21  PT LONG TERM GOAL #5   Title Pt will improve FOTO score from 52% to 59% to demo improved function.    Time 8     Period Weeks    Status New    Target Date 04/04/21                   Plan - 02/22/21 9604     Clinical Impression Statement Patient demos continued core strength and lumbopelvic stability impairments as she required multimodal cuing for neutral spine when perfroming quadruped hip extension. Verbal cues provided for decreased levator and upper trap activation when performing row and antirotation press exercise. Patient exhibits some positive response to current POC as she had 0/10 back pain yesterday for the entire day. Would benefit from continued skilled intervention to address impairments for decreased pain and improved functional activity tolerance.    Personal Factors and Comorbidities Time since onset of injury/illness/exacerbation    Examination-Activity Limitations Locomotion Level;Stand;Lift;Squat;Carry    Examination-Participation Restrictions Community Activity;Cleaning;Laundry;Yard Work;Meal Prep    Rehab Potential Excellent    PT Frequency 2x / week    PT Duration 8 weeks    PT Treatment/Interventions ADLs/Self Care Home Management;Electrical Stimulation;Cryotherapy;Moist Heat;Traction;Therapeutic exercise;Neuromuscular re-education;Patient/family education;Manual techniques;Passive range of motion;Dry needling;Functional mobility training;Spinal Manipulations    PT Next Visit Plan continue spinal decompression, postural awareness, and lumbopelvic stability, STM and manual techniques as needed    PT Home Exercise Plan Access Code: [email or web address]    Consulted and Agree with Plan of Care Patient             Patient will benefit from skilled therapeutic intervention in order to improve the following deficits and impairments:  Postural dysfunction, Decreased mobility, Hypomobility, Improper body mechanics, Pain, Increased muscle spasms  Visit Diagnosis: Chronic bilateral low back pain without sciatica  Cramp and spasm  Muscle weakness (generalized)     Problem  List Patient Active Problem List   Diagnosis Date Noted   GAD (generalized anxiety disorder) 10/26/2020   MRSA nasal colonization 10/26/2020   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   Polymyalgia rheumatica (HCC) 10/23/2019   Spinal stenosis, lumbar region without neurogenic claudication 10/23/2019   Lumbar facet arthropathy 10/23/2019   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 [place], [place], [address] Phone: [phone]   Fax:  [phone]  Name: Cheryl Austin MRN: [id number] Date of Birth: 1941-07-08

## 2021-02-22 NOTE — Patient Instructions (Signed)
Standing Anti-Rotation Press with Anchored Resistance - 1 x daily - 7 x weekly - 1 sets - 15 reps Seated Flexion Stretch - 1 x daily - 7 x weekly - 1 sets - 5 reps - 5-10s hold

## 2021-02-24 ENCOUNTER — Encounter: Payer: Medicare Other | Admitting: Physical Therapy

## 2021-03-01 ENCOUNTER — Ambulatory Visit: Payer: Medicare Other | Admitting: Physical Therapy

## 2021-03-01 ENCOUNTER — Other Ambulatory Visit: Payer: Self-pay

## 2021-03-01 ENCOUNTER — Encounter: Payer: Self-pay | Admitting: Physical Therapy

## 2021-03-01 DIAGNOSIS — G8929 Other chronic pain: Secondary | ICD-10-CM

## 2021-03-01 DIAGNOSIS — R252 Cramp and spasm: Secondary | ICD-10-CM

## 2021-03-01 DIAGNOSIS — M6281 Muscle weakness (generalized): Secondary | ICD-10-CM

## 2021-03-01 DIAGNOSIS — M545 Low back pain, unspecified: Secondary | ICD-10-CM | POA: Diagnosis not present

## 2021-03-01 NOTE — Therapy (Signed)
[facility] [address], [address] [place], [place], [address] Phone: [phone]   Fax:  [phone]  Physical Therapy Treatment  Patient Details  Name: Cheryl Austin MRN: [id number] Date of Birth: 1941/05/18 Referring Provider (PT): M54.50,G89.29 (ICD-10-CM) - Chronic low back pain, unspecified back pain laterality, unspecified whether sciatica present   Encounter Date: 03/01/2021   PT End of Session - 03/01/21 1105     Visit Number 6    Date for PT Re-Evaluation 04/04/21    Authorization Type [employer] Medicare    Progress Note Due on Visit 10    PT Start Time 1020    PT Stop Time 1100    PT Time Calculation (min) 40 min    Activity Tolerance Patient tolerated treatment well    Behavior During Therapy [facility] for tasks assessed/performed             Past Medical History:  Diagnosis Date   Anxiety    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 Corliss Skains   Restless legs syndrome (RLS) 08/02/2017   Spinal stenosis, lumbar region without neurogenic claudication 10/23/2019   By lumbar MRI 06/2019, mild   Thyroid disease    Hypothyroid    Past Surgical History:  Procedure Laterality Date   APPENDECTOMY  1961   BREAST SURGERY Bilateral 1987 1998   Lumpectomy   EYE SURGERY     TOTAL ABDOMINAL HYSTERECTOMY  1986   BSO/Fibroids    There were no vitals filed for this visit.   Subjective Assessment - 03/01/21 1025     Subjective I am not sure PT is helping. I have had a few bad days.  I have local pain in spine that spreads both ways to hips.    Pertinent History PMH: polymyalgia rheumatica, osteoporosis     Limitations Standing;Walking    How long can you stand comfortably? 1-2 hours (sitting improves pain)    How long can you walk comfortably? 1-2 hours    Diagnostic tests lumbar xray 2021L5/S1 mild spondylosis, moderate facet joint arthropathy    Patient Stated Goals be able to stand longer before needing to sit, get rid of pain, get back to gym and learn exercises    Currently in Pain? Yes    Pain Score 4     Pain Location Back    Pain Orientation Right;Left;Lower    Pain Type Chronic pain                               [facility] Adult PT Treatment/Exercise - 03/01/21 0001       Self-Care   Self-Care Other Self-Care Comments    Other Self-Care Comments  DN education and aftercare      Lumbar Exercises: Stretches   Double Knee to Chest Stretch 5 reps;10 seconds    Double Knee to Chest Stretch Limitations open knees toward shoulders    Piriformis Stretch Left;1 rep;30 seconds    Piriformis Stretch Limitations Lt    Figure 4 Stretch 1 rep;30 seconds;With overpressure    Figure 4 Stretch Limitations Lt  Other Lumbar Stretch Exercise windshield wipers x 10      Manual Therapy   Manual Therapy Joint mobilization;Soft tissue mobilization;Manual Traction    Joint Mobilization Lt sided lumbar PAVMS L3-S1 Gr II/III    Soft tissue mobilization bil lumbar mulfitidi and Lt gluteals and piriformis after DN    Manual Traction prone sacral distraction Gr II/III              Trigger Point Dry Needling - 03/01/21 0001     Consent Given? Yes    Education Handout Provided Previously provided    Muscles Treated Back/Hip Gluteus medius;Gluteus minimus;Piriformis;Lumbar multifidi    Dry Needling Comments Lt hip, bil multifidi L4-S1    Gluteus Minimus Response Twitch response elicited;Palpable increased muscle length    Gluteus Medius Response Twitch response elicited;Palpable increased muscle length    Piriformis Response Twitch response elicited;Palpable increased muscle  length    Lumbar multifidi Response Twitch response elicited;Palpable increased muscle length                    PT Short Term Goals - 02/22/21 0940       PT SHORT TERM GOAL #1   Title Pt will be ind with initial HEP    Time 3    Period Weeks    Status On-going    Target Date 02/28/21      PT SHORT TERM GOAL #2   Title Pt will practice spinal decompression and diaphragmatic breathing at least 5 days/week in the afternoon to address spinal health and abdominal tension.    Time 3    Period Weeks    Status On-going      PT SHORT TERM GOAL #3   Title Pt will demo reduced abdominal tension/improved resting position of the ribcage with ability to perform lateral costal expansion and abdominal breathing to normalize trunk posture and prep for proper core activation.    Time 4    Period Weeks    Status On-going               PT Long Term Goals - 02/07/21 1308       PT LONG TERM GOAL #1   Title Pt to be independent with final HEP with good understanding of how to transition to gym.    Time 8    Period Weeks    Status New    Target Date 04/04/21      PT LONG TERM GOAL #2   Title Pt will report ability to perform standing activities in house and yard for at least 2 hours with min or no exacerbation of pain.    Time 8    Period Weeks    Status New    Target Date 04/04/21      PT LONG TERM GOAL #3   Title Pt will demo optimal dynamic functional movement for squat, lift, carry using core and good body mechanics to reduce pain with functional tasks.    Time 8    Period Days    Status New    Target Date 04/04/21      PT LONG TERM GOAL #4   Title Pt will report at least 70% reduction in soreness with daily tasks.    Time 8    Period Weeks    Status New    Target Date 04/04/21      PT LONG TERM GOAL #5   Title Pt will improve FOTO score from 52% to 59% to demo  improved function.    Time 8    Period Weeks    Status New    Target Date 04/04/21                    Plan - 03/01/21 1104     Clinical Impression Statement Pt arrived interested in trying DN again due to no progress in pain reduction with PT so far.  She had limited and painful trunk flexion and Lt SB which improved for both increased ROM and pain following session today.  PT performed DN to bil lumbar multifidi with release Lt>Rt at L4-S1, as well as to Lt gluteals and piriformis.  Gentle stretching, STM and education for DN aftercare performed.  PT updated HEP for DKTC and Lt hip stretches in supine.  Pt will likely be a good candidate for further DN as needed at future visits.    PT Frequency 2x / week    PT Duration 8 weeks    PT Treatment/Interventions ADLs/Self Care Home Management;Electrical Stimulation;Cryotherapy;Moist Heat;Traction;Therapeutic exercise;Neuromuscular re-education;Patient/family education;Manual techniques;Passive range of motion;Dry needling;Functional mobility training;Spinal Manipulations    PT Next Visit Plan f/u on DN #1, has lumbar flexion and Lt SB ROM remained improved and with reduced pain, continue lumbopelvic stability, hip strength, manual and DN as needed    PT Home Exercise Plan Access Code: [email or web address]    Consulted and Agree with Plan of Care Patient             Patient will benefit from skilled therapeutic intervention in order to improve the following deficits and impairments:     Visit Diagnosis: Chronic bilateral low back pain without sciatica  Cramp and spasm  Muscle weakness (generalized)     Problem List Patient Active Problem List   Diagnosis Date Noted   GAD (generalized anxiety disorder) 10/26/2020   MRSA nasal colonization 10/26/2020   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   Polymyalgia rheumatica (HCC) 10/23/2019   Spinal stenosis, lumbar region without neurogenic claudication 10/23/2019   Lumbar facet arthropathy 10/23/2019   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 [place], [place], [address] Phone: [phone]   Fax:  [phone]  Name: Cheryl Austin MRN: [id number] Date of Birth: 1941-06-01

## 2021-03-01 NOTE — Patient Instructions (Signed)

## 2021-03-03 ENCOUNTER — Encounter: Payer: Self-pay | Admitting: Physical Therapy

## 2021-03-03 ENCOUNTER — Ambulatory Visit: Payer: Medicare Other | Admitting: Physical Therapy

## 2021-03-03 ENCOUNTER — Other Ambulatory Visit: Payer: Self-pay

## 2021-03-03 DIAGNOSIS — M545 Low back pain, unspecified: Secondary | ICD-10-CM | POA: Diagnosis not present

## 2021-03-03 DIAGNOSIS — G8929 Other chronic pain: Secondary | ICD-10-CM

## 2021-03-03 DIAGNOSIS — R252 Cramp and spasm: Secondary | ICD-10-CM

## 2021-03-03 DIAGNOSIS — M6281 Muscle weakness (generalized): Secondary | ICD-10-CM

## 2021-03-03 NOTE — Therapy (Signed)
[facility] [address], [address] [place], [place], [address] Phone: [phone]   Fax:  [phone]  Physical Therapy Treatment  Patient Details  Name: Cheryl Austin MRN: [id number] Date of Birth: 08/19/1941 Referring Provider (PT): M54.50,G89.29 (ICD-10-CM) - Chronic low back pain, unspecified back pain laterality, unspecified whether sciatica present   Encounter Date: 03/03/2021   PT End of Session - 03/03/21 1014     Visit Number 7    Date for PT Re-Evaluation 04/04/21    Authorization Type [employer] Medicare    Progress Note Due on Visit 10    PT Start Time 0930    PT Stop Time 1013    PT Time Calculation (min) 43 min    Activity Tolerance Patient tolerated treatment well    Behavior During Therapy [facility] for tasks assessed/performed             Past Medical History:  Diagnosis Date   Anxiety    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 Corliss Skains   Restless legs syndrome (RLS) 08/02/2017   Spinal stenosis, lumbar region without neurogenic claudication 10/23/2019   By lumbar MRI 06/2019, mild   Thyroid disease    Hypothyroid    Past Surgical History:  Procedure Laterality Date   APPENDECTOMY  1961   BREAST SURGERY Bilateral 1987 1998   Lumpectomy   EYE SURGERY     TOTAL ABDOMINAL HYSTERECTOMY  1986   BSO/Fibroids    There were no vitals filed for this visit.   Subjective Assessment - 03/03/21 0933     Subjective I had signif relief for the rest of the day after DN - it did start to come back the next day. I haven't had any extension of pain into the hips/buttocks since the DN.    Pertinent  History PMH: polymyalgia rheumatica, osteoporosis    Limitations Standing;Walking    How long can you stand comfortably? 1-2 hours (sitting improves pain)    How long can you walk comfortably? 1-2 hours    Diagnostic tests lumbar xray 2021L5/S1 mild spondylosis, moderate facet joint arthropathy    Patient Stated Goals be able to stand longer before needing to sit, get rid of pain, get back to gym and learn exercises    Currently in Pain? Yes    Pain Location Back    Pain Orientation Lower;Right;Left    Pain Descriptors / Indicators Sore;Tightness    Pain Type Chronic pain                               [facility] Adult PT Treatment/Exercise - 03/03/21 0001       Neuro Re-ed    Neuro Re-ed Details  TA, PF and deep mutlifidi activation in prone 5x5" holds, TA in sidelying 10x10 sec holds      Lumbar Exercises: Stretches   Double Knee to Chest Stretch 30 seconds    Double Knee to Chest Stretch Limitations open knees toward shoulders, rock for self-massage    Piriformis Stretch Left;1 rep;30 seconds    Piriformis Stretch Limitations bil  Figure 4 Stretch 1 rep;30 seconds;With overpressure    Figure 4 Stretch Limitations bil, progresed to full fig 4 stretch today    Other Lumbar Stretch Exercise windshield wipers x 10      Manual Therapy   Manual Therapy Joint mobilization;Soft tissue mobilization;Manual Traction    Joint Mobilization Lt sided lumbar PAVMS L3-S1 Gr II/III    Soft tissue mobilization bil lumbar mulfitidi and Lt gluteals    Manual Traction prone sacral distraction Gr II/III                      PT Short Term Goals - 02/22/21 0940       PT SHORT TERM GOAL #1   Title Pt will be ind with initial HEP    Time 3    Period Weeks    Status On-going    Target Date 02/28/21      PT SHORT TERM GOAL #2   Title Pt will practice spinal decompression and diaphragmatic breathing at least 5 days/week in the afternoon to address spinal health and  abdominal tension.    Time 3    Period Weeks    Status On-going      PT SHORT TERM GOAL #3   Title Pt will demo reduced abdominal tension/improved resting position of the ribcage with ability to perform lateral costal expansion and abdominal breathing to normalize trunk posture and prep for proper core activation.    Time 4    Period Weeks    Status On-going               PT Long Term Goals - 02/07/21 1308       PT LONG TERM GOAL #1   Title Pt to be independent with final HEP with good understanding of how to transition to gym.    Time 8    Period Weeks    Status New    Target Date 04/04/21      PT LONG TERM GOAL #2   Title Pt will report ability to perform standing activities in house and yard for at least 2 hours with min or no exacerbation of pain.    Time 8    Period Weeks    Status New    Target Date 04/04/21      PT LONG TERM GOAL #3   Title Pt will demo optimal dynamic functional movement for squat, lift, carry using core and good body mechanics to reduce pain with functional tasks.    Time 8    Period Days    Status New    Target Date 04/04/21      PT LONG TERM GOAL #4   Title Pt will report at least 70% reduction in soreness with daily tasks.    Time 8    Period Weeks    Status New    Target Date 04/04/21      PT LONG TERM GOAL #5   Title Pt will improve FOTO score from 52% to 59% to demo improved function.    Time 8    Period Weeks    Status New    Target Date 04/04/21                   Plan - 03/03/21 1200     Clinical Impression Statement Pt arrived with report of signif improvement with DN last visit.  She had good carry over of improved lumbar flexion and Lt SB gains from last visit.  She was actually more tight on Rt>Lt today with lumbar PAVMS and multifidi restrictions given release of Lt side last visit.  She reported reduction in pain end of session following manual therapy and intro of neuro re-ed for deep core activation today.   Deep multifii activation is best when co-cued with TA engagement.  PT discussed anticipated progression of combo of DN and progression of ther ex for core with functional strength over next several visits.    Rehab Potential Excellent    PT Frequency 2x / week    PT Duration 8 weeks    PT Treatment/Interventions ADLs/Self Care Home Management;Electrical Stimulation;Cryotherapy;Moist Heat;Traction;Therapeutic exercise;Neuromuscular re-education;Patient/family education;Manual techniques;Passive range of motion;Dry needling;Functional mobility training;Spinal Manipulations    PT Next Visit Plan DN #2 lumbar multifidi and hips, f/u on core cueing and progress trunk and hip strength    PT Home Exercise Plan Access Code: [email or web address]    Consulted and Agree with Plan of Care Patient             Patient will benefit from skilled therapeutic intervention in order to improve the following deficits and impairments:     Visit Diagnosis: Chronic bilateral low back pain without sciatica  Cramp and spasm  Muscle weakness (generalized)     Problem List Patient Active Problem List   Diagnosis Date Noted   GAD (generalized anxiety disorder) 10/26/2020   MRSA nasal colonization 10/26/2020   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   Polymyalgia rheumatica (HCC) 10/23/2019   Spinal stenosis, lumbar region without neurogenic claudication 10/23/2019   Lumbar facet arthropathy 10/23/2019   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 [place], [place], [address] Phone: [phone]   Fax:  [phone]  Name: Jaice Lague MRN: [id number] Date of Birth: Oct 27, 1940

## 2021-03-06 ENCOUNTER — Other Ambulatory Visit: Payer: Self-pay

## 2021-03-06 ENCOUNTER — Ambulatory Visit: Payer: Medicare Other | Admitting: Physical Therapy

## 2021-03-06 ENCOUNTER — Encounter: Payer: Self-pay | Admitting: Physical Therapy

## 2021-03-06 DIAGNOSIS — G8929 Other chronic pain: Secondary | ICD-10-CM

## 2021-03-06 DIAGNOSIS — M6281 Muscle weakness (generalized): Secondary | ICD-10-CM

## 2021-03-06 DIAGNOSIS — R252 Cramp and spasm: Secondary | ICD-10-CM

## 2021-03-06 DIAGNOSIS — M545 Low back pain, unspecified: Secondary | ICD-10-CM | POA: Diagnosis not present

## 2021-03-06 NOTE — Therapy (Signed)
[facility] [address], [address] [place], [place], [address] Phone: [phone]   Fax:  [phone]  Physical Therapy Treatment  Patient Details  Name: Cheryl Austin MRN: [id number] Date of Birth: 1940/09/25 Referring Provider (PT): M54.50,G89.29 (ICD-10-CM) - Chronic low back pain, unspecified back pain laterality, unspecified whether sciatica present   Encounter Date: 03/06/2021   PT End of Session - 03/06/21 1100     Visit Number 8    Date for PT Re-Evaluation 04/04/21    Authorization Type [employer] Medicare    Progress Note Due on Visit 10    PT Start Time 1017    PT Stop Time 1100    PT Time Calculation (min) 43 min    Activity Tolerance Patient tolerated treatment well    Behavior During Therapy [facility] for tasks assessed/performed             Past Medical History:  Diagnosis Date   Anxiety    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 Corliss Skains   Restless legs syndrome (RLS) 08/02/2017   Spinal stenosis, lumbar region without neurogenic claudication 10/23/2019   By lumbar MRI 06/2019, mild   Thyroid disease    Hypothyroid    Past Surgical History:  Procedure Laterality Date   APPENDECTOMY  1961   BREAST SURGERY Bilateral 1987 1998   Lumpectomy   EYE SURGERY     TOTAL ABDOMINAL HYSTERECTOMY  1986   BSO/Fibroids    There were no vitals filed for this visit.   Subjective Assessment - 03/06/21 1019     Subjective My Rt hip actually became pretty painful after last visit x 2 days but is better today.    Pertinent History PMH: polymyalgia rheumatica, osteoporosis    Limitations  Standing;Walking    How long can you stand comfortably? 1-2 hours (sitting improves pain)    How long can you walk comfortably? 1-2 hours    Diagnostic tests lumbar xray 2021L5/S1 mild spondylosis, moderate facet joint arthropathy    Patient Stated Goals be able to stand longer before needing to sit, get rid of pain, get back to gym and learn exercises    Currently in Pain? Yes    Pain Score 3     Pain Location Back    Pain Orientation Right;Left;Mid    Pain Descriptors / Indicators Aching;Sore    Pain Type Chronic pain    Pain Onset More than a month ago    Pain Frequency Intermittent                               [facility] Adult PT Treatment/Exercise - 03/06/21 0001       Exercises   Exercises Knee/Hip      Lumbar Exercises: Standing   Other Standing Lumbar Exercises palloff press green x 15 each way    Other Standing Lumbar Exercises 10lb farmer carry x 40' each      Lumbar Exercises: Seated   Sit to Stand 10 reps    Sit to Stand Limitations hold 5 lb weight plate at chest  Knee/Hip Exercises: Seated   Clamshell with TheraBand Yellow   20 reps, in tall sitting, PT cued outer 1/3 of hip range for clam pulses   Sit to Sand 1 set;10 reps;without UE support   hold 5lb weight plate at chest     Shoulder Exercises: Standing   Extension Strengthening;Both;5 reps    Extension Limitations hold 5 sec for core awareness    Row Strengthening;10 reps;Theraband    Theraband Level (Shoulder Row) Level 2 (Red)              Trigger Point Dry Needling - 03/06/21 0001     Consent Given? Yes    Education Handout Provided Previously provided    Muscles Treated Back/Hip Gluteus medius;Gluteus minimus;Piriformis;Lumbar multifidi    Dry Needling Comments bil hip, bil multifidi L4-S1    Gluteus Minimus Response Twitch response elicited;Palpable increased muscle length    Gluteus Medius Response Twitch response elicited;Palpable increased muscle length    Piriformis  Response Twitch response elicited;Palpable increased muscle length    Lumbar multifidi Response Twitch response elicited;Palpable increased muscle length                    PT Short Term Goals - 02/22/21 0940       PT SHORT TERM GOAL #1   Title Pt will be ind with initial HEP    Time 3    Period Weeks    Status On-going    Target Date 02/28/21      PT SHORT TERM GOAL #2   Title Pt will practice spinal decompression and diaphragmatic breathing at least 5 days/week in the afternoon to address spinal health and abdominal tension.    Time 3    Period Weeks    Status On-going      PT SHORT TERM GOAL #3   Title Pt will demo reduced abdominal tension/improved resting position of the ribcage with ability to perform lateral costal expansion and abdominal breathing to normalize trunk posture and prep for proper core activation.    Time 4    Period Weeks    Status On-going               PT Long Term Goals - 02/07/21 1308       PT LONG TERM GOAL #1   Title Pt to be independent with final HEP with good understanding of how to transition to gym.    Time 8    Period Weeks    Status New    Target Date 04/04/21      PT LONG TERM GOAL #2   Title Pt will report ability to perform standing activities in house and yard for at least 2 hours with min or no exacerbation of pain.    Time 8    Period Weeks    Status New    Target Date 04/04/21      PT LONG TERM GOAL #3   Title Pt will demo optimal dynamic functional movement for squat, lift, carry using core and good body mechanics to reduce pain with functional tasks.    Time 8    Period Days    Status New    Target Date 04/04/21      PT LONG TERM GOAL #4   Title Pt will report at least 70% reduction in soreness with daily tasks.    Time 8    Period Weeks    Status New    Target Date 04/04/21  PT LONG TERM GOAL #5   Title Pt will improve FOTO score from 52% to 59% to demo improved function.    Time 8    Period  Weeks    Status New    Target Date 04/04/21                   Plan - 03/06/21 1100     Clinical Impression Statement Pt reported Rt hip pain over the weekend.  She presented with limited trunk ROM into bil SB and forward bending today with TPs present in Lt>Rt lumbar mutlif L3-S1 and Rt>Lt lateral hip.  DN used to address and release tension in these muscles with much improved painfree motion end of session.  PT initiated functional LE stength and layered resistance bands via UEs for standing postural strength and core activation.  Good tolerance of all intervensions today.    Rehab Potential Excellent    PT Frequency 2x / week    PT Duration 8 weeks    PT Next Visit Plan DN #3 lumbar multifidi and hips, f/u on core cueing and progress trunk and hip strength    PT Home Exercise Plan Access Code: [email or web address]    Consulted and Agree with Plan of Care Patient             Patient will benefit from skilled therapeutic intervention in order to improve the following deficits and impairments:     Visit Diagnosis: Chronic bilateral low back pain without sciatica  Cramp and spasm  Muscle weakness (generalized)     Problem List Patient Active Problem List   Diagnosis Date Noted   GAD (generalized anxiety disorder) 10/26/2020   MRSA nasal colonization 10/26/2020   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   Polymyalgia rheumatica (HCC) 10/23/2019   Spinal stenosis, lumbar region without neurogenic claudication 10/23/2019   Lumbar facet arthropathy 10/23/2019   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 [place], [place], [address] Phone: [phone]   Fax:  [phone]  Name: Kaysea Raya MRN: [id number] Date of Birth: Jun 16, 1941

## 2021-03-08 ENCOUNTER — Other Ambulatory Visit: Payer: Self-pay

## 2021-03-08 ENCOUNTER — Ambulatory Visit: Payer: Medicare Other | Admitting: Physical Therapy

## 2021-03-08 ENCOUNTER — Encounter: Payer: Self-pay | Admitting: Physical Therapy

## 2021-03-08 DIAGNOSIS — R252 Cramp and spasm: Secondary | ICD-10-CM

## 2021-03-08 DIAGNOSIS — M6281 Muscle weakness (generalized): Secondary | ICD-10-CM

## 2021-03-08 DIAGNOSIS — M545 Low back pain, unspecified: Secondary | ICD-10-CM

## 2021-03-08 DIAGNOSIS — G8929 Other chronic pain: Secondary | ICD-10-CM

## 2021-03-08 NOTE — Therapy (Signed)
[facility] [address] [place], [place], [address] Phone: [phone]   Fax:  [phone]  Physical Therapy Treatment  Patient Details  Name: Cheryl Austin MRN: [id number] Date of Birth: 08/26/1940 Referring Provider (PT): M54.50,G89.29 (ICD-10-CM) - Chronic low back pain, unspecified back pain laterality, unspecified whether sciatica present   Encounter Date: 03/08/2021   PT End of Session - 03/08/21 1103     Visit Number 9    Date for PT Re-Evaluation 04/04/21    Authorization Type [employer]HC Medicare    Progress Note Due on Visit 10    PT Start Time 1017    PT Stop Time 1100    PT Time Calculation (min) 43 min    Activity Tolerance Patient tolerated treatment well    Behavior During Therapy [facility] for tasks assessed/performed             Past Medical History:  Diagnosis Date   Anxiety    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 Corliss Skains   Restless legs syndrome (RLS) 08/02/2017   Spinal stenosis, lumbar region without neurogenic claudication 10/23/2019   By lumbar MRI 06/2019, mild   Thyroid disease    Hypothyroid    Past Surgical History:  Procedure Laterality Date   APPENDECTOMY  1961   BREAST SURGERY Bilateral 1987 1998   Lumpectomy   EYE SURGERY     TOTAL ABDOMINAL HYSTERECTOMY  1986   BSO/Fibroids    There were no vitals filed for this visit.   Subjective Assessment - 03/08/21 1018     Subjective The Rt hip is still bothering me but the DN did help last time.  It comes back.    Pertinent History PMH: polymyalgia rheumatica, osteoporosis    Limitations Standing;Walking     How long can you stand comfortably? 1-2 hours (sitting improves pain)    How long can you walk comfortably? 1-2 hours    Diagnostic tests lumbar xray 2021L5/S1 mild spondylosis, moderate facet joint arthropathy    Patient Stated Goals be able to stand longer before needing to sit, get rid of pain, get back to gym and learn exercises    Currently in Pain? Yes    Pain Score 3     Pain Location Hip    Pain Orientation Right;Lateral    Pain Descriptors / Indicators Aching;Sore    Pain Type Chronic pain                               [facility] Adult PT Treatment/Exercise - 03/08/21 0001       Exercises   Exercises Knee/Hip;Lumbar;Shoulder      Lumbar Exercises: Stretches   Active Hamstring Stretch Left;Right;1 rep;30 seconds    Figure 4 Stretch 1 rep;30 seconds;With overpressure    Figure 4 Stretch Limitations limited on Rt, performed bil    Other Lumbar Stretch Exercise ball rollout lumbar stretch x10 reps      Lumbar Exercises: Supine   Dead Bug 10 reps    Dead Bug Limitations bil arms only 3lb overhead, then opp arm/leg x 10  Lumbar Exercises: Sidelying   Clam Both;10 reps    Clam Limitations manual resistance in both phases by PT    Hip Abduction Both;10 reps      Lumbar Exercises: Quadruped   Madcat/Old Horse 10 reps      Manual Therapy   Manual Therapy Soft tissue mobilization    Soft tissue mobilization Rt hip deep rotators, lumbar multifidi, SI joint                      PT Short Term Goals - 02/22/21 0940       PT SHORT TERM GOAL #1   Title Pt will be ind with initial HEP    Time 3    Period Weeks    Status On-going    Target Date 02/28/21      PT SHORT TERM GOAL #2   Title Pt will practice spinal decompression and diaphragmatic breathing at least 5 days/week in the afternoon to address spinal health and abdominal tension.    Time 3    Period Weeks    Status On-going      PT SHORT TERM GOAL #3   Title Pt will demo reduced  abdominal tension/improved resting position of the ribcage with ability to perform lateral costal expansion and abdominal breathing to normalize trunk posture and prep for proper core activation.    Time 4    Period Weeks    Status On-going               PT Long Term Goals - 02/07/21 1308       PT LONG TERM GOAL #1   Title Pt to be independent with final HEP with good understanding of how to transition to gym.    Time 8    Period Weeks    Status New    Target Date 04/04/21      PT LONG TERM GOAL #2   Title Pt will report ability to perform standing activities in house and yard for at least 2 hours with min or no exacerbation of pain.    Time 8    Period Weeks    Status New    Target Date 04/04/21      PT LONG TERM GOAL #3   Title Pt will demo optimal dynamic functional movement for squat, lift, carry using core and good body mechanics to reduce pain with functional tasks.    Time 8    Period Days    Status New    Target Date 04/04/21      PT LONG TERM GOAL #4   Title Pt will report at least 70% reduction in soreness with daily tasks.    Time 8    Period Weeks    Status New    Target Date 04/04/21      PT LONG TERM GOAL #5   Title Pt will improve FOTO score from 52% to 59% to demo improved function.    Time 8    Period Weeks    Status New    Target Date 04/04/21                   Plan - 03/08/21 1039     Clinical Impression Statement Pt reports short term relief with DN with approx 20-25% overall reduction in pain, especially Lt sided pain.  She has ongoing TPs and restriction of Rt deep hip rotators.  PT performed STM and added stretches for these today with Pt  report of relief.  PT demo'd how she could use a tennis ball for pressure release to these muscles in sitting or supine.  She was able to progress lumbopelvic and hip stability today with good tolerance.  Repeat DN next visit with focus on Rt hip.    PT Frequency 2x / week    PT Duration 8 weeks     PT Treatment/Interventions ADLs/Self Care Home Management;Electrical Stimulation;Cryotherapy;Moist Heat;Traction;Therapeutic exercise;Neuromuscular re-education;Patient/family education;Manual techniques;Passive range of motion;Dry needling;Functional mobility training;Spinal Manipulations    PT Next Visit Plan 10th visit PN next time, Rt hip DN    PT Home Exercise Plan Access Code: [email or web address]    Consulted and Agree with Plan of Care Patient             Patient will benefit from skilled therapeutic intervention in order to improve the following deficits and impairments:     Visit Diagnosis: Chronic bilateral low back pain without sciatica  Cramp and spasm  Muscle weakness (generalized)     Problem List Patient Active Problem List   Diagnosis Date Noted   GAD (generalized anxiety disorder) 10/26/2020   MRSA nasal colonization 10/26/2020   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   Polymyalgia rheumatica (HCC) 10/23/2019   Spinal stenosis, lumbar region without neurogenic claudication 10/23/2019   Lumbar facet arthropathy 10/23/2019   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 [place], [place], [address] Phone: [phone]   Fax:  [phone]  Name: Cheryl Austin MRN: [id number] Date of Birth: 1941/05/31

## 2021-03-13 ENCOUNTER — Ambulatory Visit: Payer: Medicare Other | Admitting: Physical Therapy

## 2021-03-13 ENCOUNTER — Other Ambulatory Visit: Payer: Self-pay

## 2021-03-13 ENCOUNTER — Encounter: Payer: Self-pay | Admitting: Physical Therapy

## 2021-03-13 DIAGNOSIS — M545 Low back pain, unspecified: Secondary | ICD-10-CM | POA: Diagnosis not present

## 2021-03-13 DIAGNOSIS — G8929 Other chronic pain: Secondary | ICD-10-CM

## 2021-03-13 DIAGNOSIS — R252 Cramp and spasm: Secondary | ICD-10-CM

## 2021-03-13 DIAGNOSIS — M6281 Muscle weakness (generalized): Secondary | ICD-10-CM

## 2021-03-13 NOTE — Therapy (Signed)
[facility] [address], [place], [place], [address] Phone: [phone]   Fax:  [phone]  Physical Therapy Treatment  Patient Details  Name: Cheryl Austin MRN: [id number] Date of Birth: May 08, 1941 Referring Provider (PT): M54.50,G89.29 (ICD-10-CM) - Chronic low back pain, unspecified back pain laterality, unspecified whether sciatica present   Progress Note Reporting Period 02/07/21 to 03/13/21  See note below for Objective Data and Assessment of Progress/Goals.      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   By lumbar MRI 06/2019, mild   Thyroid disease    Hypothyroid    Past Surgical History:  Procedure Laterality Date   [place]   BSO/Fibroids    There were no vitals filed for this visit.   Subjective Assessment - 03/13/21 1021     Subjective Some good days and some bad days.  I have to  stretch a lot.  Rt hip is still most bothersome at this point.  Lt side has reduced by 50% for pain.  The DN is helping that side.    Pertinent History PMH: polymyalgia rheumatica, osteoporosis    Limitations Standing;Walking    How long can you stand comfortably? 1-2 hours (sitting improves pain)    How long can you walk comfortably? 1-2 hours    Diagnostic tests lumbar xray 2021L5/S1 mild spondylosis, moderate facet joint arthropathy    Patient Stated Goals be able to stand longer before needing to sit, get rid of pain, get back to gym and learn exercises    Currently in Pain? Yes    Pain Score 3     Pain Location Hip    Pain Orientation Right;Lateral;Posterior    Pain Descriptors / Indicators Aching;Sore;Tightness    Pain Type Chronic pain    Pain Onset More than a month ago    Pain Frequency Intermittent    Aggravating Factors  it's always there                [facility] PT Assessment - 03/13/21 0001       Assessment   Medical Diagnosis Hilts, Michael, MD    Referring Provider (PT) M54.50,G89.29 (ICD-10-CM) - Chronic low back pain, unspecified back pain laterality, unspecified whether sciatica present  Onset Date/Surgical Date --   2-3 years   Hand Dominance Right    Next MD Visit as needed    Prior Therapy yes, a long time ago      Observation/Other Assessments   Focus on Therapeutic Outcomes (FOTO)  54%, improved by 2% from eval      Posture/Postural Control   Posture Comments improving tone of obliques to allow for ribcage mobility      Palpation   Spinal mobility limited bil facet and PAs L4-S1    Palpation comment TPs present Rt glut med and piriformis                           [facility] Adult PT Treatment/Exercise - 03/13/21 0001       Exercises   Exercises Knee/Hip;Lumbar;Shoulder      Lumbar Exercises: Stretches   Other Lumbar Stretch Exercise ball rollout lumbar stretch 2x10 sec 3-way    Other Lumbar Stretch Exercise open book x 5 each side       Lumbar Exercises: Supine   Other Supine Lumbar Exercises laying along soft foam roller: 5x Y and W stretch with inhale on movement, exhale on stretch      Lumbar Exercises: Quadruped   Opposite Arm/Leg Raise Left arm/Right leg;Right arm/Left leg;5 reps   hold 5 sec     Shoulder Exercises: Supine   Horizontal ABduction Strengthening;Both;10 reps;Theraband    Theraband Level (Shoulder Horizontal ABduction) Level 2 (Red)    Horizontal ABduction Limitations on soft foam roller              Trigger Point Dry Needling - 03/13/21 0001     Consent Given? Yes    Education Handout Provided Previously provided    Muscles Treated Back/Hip Gluteus minimus;Gluteus medius;Piriformis;Lumbar multifidi    Dry Needling Comments Rt hip, Rt multif    Gluteus Minimus Response Twitch response elicited;Palpable increased muscle length    Gluteus Medius Response Twitch response elicited;Palpable increased muscle length    Piriformis Response Twitch response elicited;Palpable increased muscle length    Lumbar multifidi Response Palpable increased muscle length                    PT Short Term Goals - 03/13/21 1024       PT SHORT TERM GOAL #1   Title Pt will be ind with initial HEP    Status Achieved      PT SHORT TERM GOAL #2   Title Pt will practice spinal decompression and diaphragmatic breathing at least 5 days/week in the afternoon to address spinal health and abdominal tension.    Status Achieved      PT SHORT TERM GOAL #3   Title Pt will demo reduced abdominal tension/improved resting position of the ribcage with ability to perform lateral costal expansion and abdominal breathing to normalize trunk posture and prep for proper core activation.    Status Partially Met               PT Long Term Goals - 03/13/21 1024       PT LONG TERM GOAL #1   Title Pt to be independent with final HEP with good understanding of how to transition to gym.    Status On-going       PT LONG TERM GOAL #2   Title Pt will report ability to perform standing activities in house and yard for at least 2 hours with min or  no exacerbation of pain.    Baseline 1 hour    Status On-going      PT LONG TERM GOAL #3   Title Pt will demo optimal dynamic functional movement for squat, lift, carry using core and good body mechanics to reduce pain with functional tasks.    Status Achieved      PT LONG TERM GOAL #4   Title Pt will report at least 70% reduction in soreness with daily tasks.    Baseline 50% improvement to date on Lt, less so on Rt    Status On-going      PT LONG TERM GOAL #5   Title Pt will improve FOTO score from 52% to 59% to demo improved function.    Baseline 54% from 52%    Status On-going                   Plan - 03/13/21 1102     Clinical Impression Statement Pt reports 50% reduction in Lt sided lumbar and hip symptoms with PT to date.  FOTO score has improved by 2%.  Pt has ongoing TPs and pain concentrated around Rt L5/S1 facet and gluteals.  DN to Rt lateral hip yielded signif twitch, release and relief today, although DN to Rt lumbar was met with no response at muscle level.  Pt reported relief with stretching for lumbar and hips using seated and supine postions today with large ball and foam roller.  Pt managing chronic pain alongside busy demands of packing up her single family home to prep for downsizing/selling.  Pt added open book and bird dog to HEP today.  Pt has difficulty stabilizing with bird dog so f/u on this next visit needed.    Rehab Potential Excellent    PT Frequency 2x / week    PT Duration 8 weeks    PT Treatment/Interventions ADLs/Self Care Home Management;Electrical Stimulation;Cryotherapy;Moist Heat;Traction;Therapeutic exercise;Neuromuscular re-education;Patient/family education;Manual techniques;Passive range of motion;Dry needling;Functional mobility training;Spinal Manipulations    PT Next Visit Plan review bird dog, open book  foot on foam roller, did Pt buy soft foam roller?  progress lumbar strength, f/u on Rt hip DN, lumbar mobs Rt side    PT Home Exercise Plan Access Code: [email or web address], added bird dog 5x5 sec holds and 5x open books on both sides, info to purchase soft foam roller    Consulted and Agree with Plan of Care Patient             Patient will benefit from skilled therapeutic intervention in order to improve the following deficits and impairments:     Visit Diagnosis: Chronic bilateral low back pain without sciatica  Cramp and spasm  Muscle weakness (generalized)     Problem List Patient Active Problem List   Diagnosis Date Noted   GAD (generalized anxiety disorder) 10/26/2020   MRSA nasal colonization 10/26/2020   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   Polymyalgia rheumatica ([place]) 10/23/2019   Spinal stenosis, lumbar region without neurogenic claudication 10/23/2019   Lumbar facet arthropathy 10/23/2019   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 [place], [place], [address] Phone: [phone]   Fax:  [phone]  Name: Cassandria Drew MRN: [id number] Date of Birth: [birth year]

## 2021-03-15 ENCOUNTER — Ambulatory Visit: Payer: Medicare Other | Admitting: Physical Therapy

## 2021-03-15 ENCOUNTER — Other Ambulatory Visit: Payer: Self-pay

## 2021-03-15 ENCOUNTER — Encounter: Payer: Self-pay | Admitting: Physical Therapy

## 2021-03-15 DIAGNOSIS — M6281 Muscle weakness (generalized): Secondary | ICD-10-CM

## 2021-03-15 DIAGNOSIS — M545 Low back pain, unspecified: Secondary | ICD-10-CM | POA: Diagnosis not present

## 2021-03-15 DIAGNOSIS — G8929 Other chronic pain: Secondary | ICD-10-CM

## 2021-03-15 NOTE — Therapy (Signed)
[facility] [address] [place], [place], [address] Phone: [phone]   Fax:  [phone]  Physical Therapy Treatment  Patient Details  Name: Cheryl Austin MRN: [id number] Date of Birth: Mar 31, 1941 Referring Provider (PT): M54.50,G89.29 (ICD-10-CM) - Chronic low back pain, unspecified back pain laterality, unspecified whether sciatica present   Encounter Date: 03/15/2021   PT End of Session - 03/15/21 1058     Visit Number 11    Date for PT Re-Evaluation 04/04/21    Authorization Type UHC Medicare    Progress Note Due on Visit 20    PT Start Time 1020    PT Stop Time 1058    PT Time Calculation (min) 38 min    Activity Tolerance Patient tolerated treatment well    Behavior During Therapy WFL for tasks assessed/performed             Past Medical History:  Diagnosis Date   Anxiety    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   By lumbar MRI 06/2019, mild   Thyroid disease    Hypothyroid    Past Surgical History:  Procedure Laterality Date   APPENDECTOMY  1961   BREAST SURGERY Bilateral [place]   BSO/Fibroids    There were no vitals filed for this visit.   Subjective Assessment - 03/15/21 1021     Subjective The DN helped so much last time.  My Rt hip feels much better.  I have been working on my bird dogs    Pertinent History PMH: polymyalgia rheumatica, osteoporosis    How long can  you stand comfortably? 1-2 hours (sitting improves pain)    How long can you walk comfortably? 1-2 hours    Diagnostic tests lumbar xray 2021L5/S1 mild spondylosis, moderate facet joint arthropathy    Patient Stated Goals be able to stand longer before needing to sit, get rid of pain, get back to gym and learn exercises    Currently in Pain? Yes    Pain Score 2     Pain Location Back    Pain Orientation Mid                               [facility] Adult PT Treatment/Exercise - 03/15/21 0001       Therapeutic Activites    Therapeutic Activities Lifting    Lifting body mechanics for 10lb box lift from low mat table, high handles x 5 reps      Exercises   Exercises Shoulder;Lumbar;Knee/Hip      Lumbar Exercises: Stretches   Active Hamstring Stretch 2 reps;20 seconds    Active Hamstring Stretch Limitations seated    Piriformis Stretch Right;1 rep;30 seconds    Figure 4 Stretch With overpressure;1 rep;30 seconds    Figure 4 Stretch Limitations Rt    Other Lumbar Stretch Exercise ball rollout lumbar stretch 2x10 sec 3-way  Other Lumbar Stretch Exercise soft foam roller under pelvis knees to chest x 30 sec      Lumbar Exercises: Standing   Other Standing Lumbar Exercises wall plank 1x30 sec, VC to avoid buttock gripping and pelvic tucking    Other Standing Lumbar Exercises march on foam 10x5" holds march to 90/90      Lumbar Exercises: Seated   Other Seated Lumbar Exercises 5lb seated deadlift x 10      Lumbar Exercises: Quadruped   Opposite Arm/Leg Raise Left arm/Right leg;Right arm/Left leg;5 seconds    Opposite Arm/Leg Raise Limitations 3 reps      Knee/Hip Exercises: Standing   Rocker Board 2 minutes      Shoulder Exercises: Seated   Elevation Strengthening;15 reps;Weights    Elevation Weight (lbs) 15    Elevation Limitations seated on blue ball    Row Strengthening;15 reps    Row Weight (lbs) 25    Row Limitations seated on blue ball    Other Seated  Exercises 2lb bil UE 3-way raises seated on blue ball    Other Seated Exercises 3lb D2 flexion seated on ball x 5 each Rt/Lt                      PT Short Term Goals - 03/13/21 1024       PT SHORT TERM GOAL #1   Title Pt will be ind with initial HEP    Status Achieved      PT SHORT TERM GOAL #2   Title Pt will practice spinal decompression and diaphragmatic breathing at least 5 days/week in the afternoon to address spinal health and abdominal tension.    Status Achieved      PT SHORT TERM GOAL #3   Title Pt will demo reduced abdominal tension/improved resting position of the ribcage with ability to perform lateral costal expansion and abdominal breathing to normalize trunk posture and prep for proper core activation.    Status Partially Met               PT Long Term Goals - 03/13/21 1024       PT LONG TERM GOAL #1   Title Pt to be independent with final HEP with good understanding of how to transition to gym.    Status On-going      PT LONG TERM GOAL #2   Title Pt will report ability to perform standing activities in house and yard for at least 2 hours with min or no exacerbation of pain.    Baseline 1 hour    Status On-going      PT LONG TERM GOAL #3   Title Pt will demo optimal dynamic functional movement for squat, lift, carry using core and good body mechanics to reduce pain with functional tasks.    Status Achieved      PT LONG TERM GOAL #4   Title Pt will report at least 70% reduction in soreness with daily tasks.    Baseline 50% improvement to date on Lt, less so on Rt    Status On-going      PT LONG TERM GOAL #5   Title Pt will improve FOTO score from 52% to 59% to demo improved function.    Baseline 54% from 52%    Status On-going                   Plan - 03/15/21 1059     Clinical Impression Statement  Pt with signif relief of Rt hip pain since last DN session and reports continued relief along Lt lumbar and hip from earlier  visits maintained.  PT progressed ther ex today to include UE overlay of cable pulleys and dumbbells while seated on ball with good tolerance and form.  She was able to perform seated deadlift with 5lb kbell and box lifts with cued lifting form from low mat table today.  She reported 2/10 pain on arrival but with ther ex, spine ROM and stretching she left with even less pain, suggesting good response to interventions today.  HEP to be progressed next visit to reflect today's ther ex if well tolerated between visits.    PT Frequency 2x / week    PT Duration 8 weeks    PT Treatment/Interventions ADLs/Self Care Home Management;Electrical Stimulation;Cryotherapy;Moist Heat;Traction;Therapeutic exercise;Neuromuscular re-education;Patient/family education;Manual techniques;Passive range of motion;Dry needling;Functional mobility training;Spinal Manipulations    PT Next Visit Plan update HEP from last visit's ther ex, DN Rt hip as needed    PT Home Exercise Plan Access Code: [email or web address], added bird dog 5x5 sec holds and 5x open books on both sides, info to purchase soft foam roller    Consulted and Agree with Plan of Care Patient             Patient will benefit from skilled therapeutic intervention in order to improve the following deficits and impairments:     Visit Diagnosis: Chronic bilateral low back pain without sciatica  Muscle weakness (generalized)     Problem List Patient Active Problem List   Diagnosis Date Noted   GAD (generalized anxiety disorder) 10/26/2020   MRSA nasal colonization 10/26/2020   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   Polymyalgia rheumatica ([place]) 10/23/2019   Spinal stenosis, lumbar region without neurogenic claudication 10/23/2019   Lumbar facet arthropathy 10/23/2019   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 [place], [place], [address] Phone: [phone]   Fax:  [phone]  Name: Cheryl Austin MRN: [id number] Date of Birth: Mar 25, 1941

## 2021-03-20 ENCOUNTER — Encounter: Payer: Medicare Other | Admitting: Physical Therapy

## 2021-03-22 ENCOUNTER — Other Ambulatory Visit: Payer: Self-pay

## 2021-03-22 ENCOUNTER — Encounter: Payer: Self-pay | Admitting: Physical Therapy

## 2021-03-22 ENCOUNTER — Ambulatory Visit: Payer: Medicare Other | Attending: Family Medicine | Admitting: Physical Therapy

## 2021-03-22 DIAGNOSIS — M6281 Muscle weakness (generalized): Secondary | ICD-10-CM | POA: Diagnosis present

## 2021-03-22 DIAGNOSIS — R252 Cramp and spasm: Secondary | ICD-10-CM | POA: Diagnosis present

## 2021-03-22 DIAGNOSIS — G8929 Other chronic pain: Secondary | ICD-10-CM | POA: Diagnosis present

## 2021-03-22 DIAGNOSIS — M545 Low back pain, unspecified: Secondary | ICD-10-CM | POA: Diagnosis present
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
## 2021-03-27 ENCOUNTER — Encounter: Payer: Self-pay | Admitting: Physical Therapy

## 2021-03-27 ENCOUNTER — Other Ambulatory Visit: Payer: Self-pay

## 2021-03-27 ENCOUNTER — Ambulatory Visit: Payer: Medicare Other | Admitting: Physical Therapy

## 2021-03-27 DIAGNOSIS — M545 Low back pain, unspecified: Secondary | ICD-10-CM

## 2021-03-27 DIAGNOSIS — G8929 Other chronic pain: Secondary | ICD-10-CM

## 2021-03-27 DIAGNOSIS — M6281 Muscle weakness (generalized): Secondary | ICD-10-CM

## 2021-03-27 DIAGNOSIS — R252 Cramp and spasm: Secondary | ICD-10-CM

## 2021-03-27 NOTE — Therapy (Signed)
[facility] [address], [place], [place], [address] Phone: [phone]   Fax:  [phone]  Physical Therapy Treatment  Patient Details  Name: Cheryl Austin MRN: [id number] Date of Birth: 03-13-1941 Referring Provider (PT): M54.50,G89.29 (ICD-10-CM) - Chronic low back pain, unspecified back pain laterality, unspecified whether sciatica present   Encounter Date: 03/27/2021   PT End of Session - 03/27/21 1108     Visit Number 13    Date for PT Re-Evaluation 04/04/21    Authorization Type UHC Medicare    Progress Note Due on Visit 20    PT Start Time 1105    PT Stop Time 1145    PT Time Calculation (min) 40 min    Activity Tolerance Patient tolerated treatment well    Behavior During Therapy [facility] for tasks assessed/performed             Past Medical History:  Diagnosis Date   Anxiety    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   By lumbar MRI 06/2019, mild   Thyroid disease    Hypothyroid    Past Surgical History:  Procedure Laterality Date   APPENDECTOMY  1961   BREAST SURGERY Bilateral [place]   BSO/Fibroids    There were no vitals filed for this visit.   Subjective Assessment - 03/27/21 1106     Subjective I am better than last week.  2/10 today.  I had a few bad days, maybe from the stress.    Pertinent History PMH: polymyalgia rheumatica, osteoporosis    Limitations Standing;Walking     How long can you stand comfortably? 1-2 hours (sitting improves pain)    How long can you walk comfortably? 1-2 hours    Diagnostic tests lumbar xray 2021L5/S1 mild spondylosis, moderate facet joint arthropathy    Patient Stated Goals be able to stand longer before needing to sit, get rid of pain, get back to gym and learn exercises    Currently in Pain? Yes    Pain Score 2     Pain Location Back    Pain Orientation Mid;Lower    Pain Descriptors / Indicators Aching;Tightness    Pain Type Chronic pain    Pain Onset More than a month ago    Pain Frequency Constant                               [facility] Adult PT Treatment/Exercise - 03/27/21 0001       Self-Care   Self-Care Other Self-Care Comments    Other Self-Care Comments  stress managemnet tools,Calm App, diaphragmatic breathing, forced supine rest during day to break up tasks and get reset levels of pain/opportunity to be active in afternoon with less pain      Neuro Re-ed    Neuro Re-ed Details  5/3/5 breathing supine with leg bolster, TC and VC from PT  Exercises   Exercises Shoulder;Lumbar;Knee/Hip      Lumbar Exercises: Stretches   Figure 4 Stretch 1 rep;30 seconds;With overpressure    Figure 4 Stretch Limitations bil      Lumbar Exercises: Aerobic   Recumbent Bike L2 x 4' with lumbar heat      Manual Therapy   Manual Therapy Manual Traction;Soft tissue mobilization;Joint mobilization    Joint Mobilization sidelying PAVMs L4-S1 gapping Gr I-III on Lt    Soft tissue mobilization Lt SI joint, bil glute med in prone    Manual Traction prone sacral distraction Gr II/III                    PT Education - 03/27/21 1109     Education Details stress management tools    Person(s) Educated Patient    Methods Explanation;Demonstration;Handout    Comprehension Verbalized understanding              PT Short Term Goals - 03/13/21 1024       PT SHORT TERM GOAL #1   Title Pt will  be ind with initial HEP    Status Achieved      PT SHORT TERM GOAL #2   Title Pt will practice spinal decompression and diaphragmatic breathing at least 5 days/week in the afternoon to address spinal health and abdominal tension.    Status Achieved      PT SHORT TERM GOAL #3   Title Pt will demo reduced abdominal tension/improved resting position of the ribcage with ability to perform lateral costal expansion and abdominal breathing to normalize trunk posture and prep for proper core activation.    Status Partially Met               PT Long Term Goals - 03/13/21 1024       PT LONG TERM GOAL #1   Title Pt to be independent with final HEP with good understanding of how to transition to gym.    Status On-going      PT LONG TERM GOAL #2   Title Pt will report ability to perform standing activities in house and yard for at least 2 hours with min or no exacerbation of pain.    Baseline 1 hour    Status On-going      PT LONG TERM GOAL #3   Title Pt will demo optimal dynamic functional movement for squat, lift, carry using core and good body mechanics to reduce pain with functional tasks.    Status Achieved      PT LONG TERM GOAL #4   Title Pt will report at least 70% reduction in soreness with daily tasks.    Baseline 50% improvement to date on Lt, less so on Rt    Status On-going      PT LONG TERM GOAL #5   Title Pt will improve FOTO score from 52% to 59% to demo improved function.    Baseline 54% from 52%    Status On-going                   Plan - 03/27/21 1235     Clinical Impression Statement Pt continues to report constant low grade pain in central lumbar region that can spread Lt and/or Rt consistent with spinal arthritis.  Pain worsens with stress and activity.  PT continues to seek tools for Pt to help manage symptoms including use of moist heat (trialed today), laying supine for spinal decompression, HEP, task variation  and pacing, and stress management  tools introduced today.  Pt instructed in diaphragmatic breathing today using 5/3/5 in/hold/out technique with TC/VC by PT with improved performance after repeated trials.  PT gave handout including other YouTube videos for various meditation forms and recommended Calm App.  Pt will conitnue to benefit from skilled PT along POC.    PT Frequency 2x / week    PT Duration 8 weeks    PT Treatment/Interventions ADLs/Self Care Home Management;Electrical Stimulation;Cryotherapy;Moist Heat;Traction;Therapeutic exercise;Neuromuscular re-education;Patient/family education;Manual techniques;Passive range of motion;Dry needling;Functional mobility training;Spinal Manipulations    PT Next Visit Plan f/u on 5/3/5 breathing, review HEP, continue prone manual traction and STM/PAVMs, focus on self-management tools/strategies for pain, DN as needed    PT Home Exercise Plan Access Code: [email or web address]    Consulted and Agree with Plan of Care Patient             Patient will benefit from skilled therapeutic intervention in order to improve the following deficits and impairments:     Visit Diagnosis: Chronic bilateral low back pain without sciatica  Muscle weakness (generalized)  Cramp and spasm     Problem List Patient Active Problem List   Diagnosis Date Noted   GAD (generalized anxiety disorder) 10/26/2020   MRSA nasal colonization 10/26/2020   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   Polymyalgia rheumatica ([place]) 10/23/2019   Spinal stenosis, lumbar region without neurogenic claudication 10/23/2019   Lumbar facet arthropathy 10/23/2019   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, [place], [place], [address] Phone: [phone]   Fax:  [phone]  Name: Cheryl Austin MRN: [id number] Date of Birth: Jan 19, 1941

## 2021-03-27 NOTE — Patient Instructions (Signed)
Stress Management and Relaxation Techniques There are two divisions in the nervous system that run many of our body's "behind the scenes" functions.  The "fight or flight" nervous system, and the "calming, rest and digest" nervous system.  These two systems have opposite effects on our body organs and systems and can impact our heart rate, blood pressure, breath rate, temperature, GI function, and experience of stress or calm.    Taking time to stimulate the "calming, rest and digest" part of our nervous system can help reduce experience of symptoms of chronic pain conditions, decrease stress and anxiety, and allow us to feel more equipped to handle challenges.  Below are strategies, techniques, and video suggestions to help stimulate this calming system.     Ways to Calm the Nervous System Yoga Meditation Mindfulness  Stretching Exercise Deep, slow breathing into belly (diaphragmatic breathing) with focused prolonged exhale Monotasking vs Multi-tasking (do one activity daily that is simple, focused, and slowly performed) Listen to your biorhythms: sleep when tired, rise when rested, eat when hungry, stop when full, etc Social connections - seek connections with others Laughter - laughing helps stimulate our "calming" nervous system Massage - by a practitioner or self-massage (example, feet for reflexology points) Singing or humming Cold exposure - try 30 sec of cold water at the end of your shower   Meditation, Yoga, Breathing, Stretching Video Suggestions [employer] YouTube Videos: Guided Meditation for Pelvic Floor Relaxation - [employer] YouTube video 10-Min Breath Meditation for Pelvic Health and Healing - [employer] YouTube video Pelvic Floor Release Stretches Bedtime Yoga for Pelvic Tension Pelvic Floor Release and Inner Thigh Stretch - Yoga for Pelvic Health (approx. 40 min) Progressive Muscle Relaxation Exercises - search Edmond Jacobson exercise videos on  YouTube Focused relaxation through guided relaxation, breathing, and contractions/relaxations of various muscle groups Autogenic Relaxation Technique - search videos on YouTube Uses body's natural relaxation response through guided meditation, inducing heaviness in body, and verbal stimuli/affirmations to create overall feeling of well-being, slowed breathing, reduced heart rate, reduced blood pressure, reduced stress/anxiety Sympathetic Breathing Meditation - search videos on YouTube Regulate the nervous system and restore calm through focused breathing to stimulate the parasympathetic nervous system (the opposite of our "fight or flight" sympathetic nervous system) Mindfulness Meditation - search videos on YouTube Focuses on choosing to be present in the moment, finding enjoyment in the now Diaphragmatic Breathing - search videos on YouTube Guided Imagery for Relaxation - search videos on YouTube  

## 2021-03-29 ENCOUNTER — Ambulatory Visit: Payer: Medicare Other | Admitting: Physical Therapy

## 2021-03-29 ENCOUNTER — Other Ambulatory Visit: Payer: Self-pay

## 2021-03-29 ENCOUNTER — Encounter: Payer: Self-pay | Admitting: Physical Therapy

## 2021-03-29 DIAGNOSIS — M545 Low back pain, unspecified: Secondary | ICD-10-CM | POA: Diagnosis not present

## 2021-03-29 DIAGNOSIS — M6281 Muscle weakness (generalized): Secondary | ICD-10-CM

## 2021-03-29 DIAGNOSIS — G8929 Other chronic pain: Secondary | ICD-10-CM

## 2021-03-29 DIAGNOSIS — R252 Cramp and spasm: Secondary | ICD-10-CM

## 2021-03-29 NOTE — Therapy (Signed)
[facility] [address] [place], [place], [address] Phone: [phone]   Fax:  [phone]  Physical Therapy Treatment  Patient Details  Name: Cheryl Austin MRN: [id number] Date of Birth: 12/29/1940 Referring Provider (PT): M54.50,G89.29 (ICD-10-CM) - Chronic low back pain, unspecified back pain laterality, unspecified whether sciatica present   Encounter Date: 03/29/2021   PT End of Session - 03/29/21 1015     Visit Number 14    Date for PT Re-Evaluation 04/04/21    Authorization Type [employer] Medicare    Progress Note Due on Visit 20    PT Start Time 0932    PT Stop Time 1015    PT Time Calculation (min) 43 min    Activity Tolerance Patient tolerated treatment well    Behavior During Therapy [facility] for tasks assessed/performed             Past Medical History:  Diagnosis Date   Anxiety    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   By lumbar MRI 06/2019, mild   Thyroid disease    Hypothyroid    Past Surgical History:  Procedure Laterality Date   APPENDECTOMY  1961   BREAST SURGERY Bilateral [place]   BSO/Fibroids    There were no vitals filed for this visit.   Subjective Assessment - 03/29/21 0945     Pertinent History PMH: polymyalgia rheumatica, osteoporosis    How long can you stand comfortably? 1-2 hours (sitting improves pain)    How long can you walk comfortably? 1-2 hours     Diagnostic tests lumbar xray 2021L5/S1 mild spondylosis, moderate facet joint arthropathy    Patient Stated Goals be able to stand longer before needing to sit, get rid of pain, get back to gym and learn exercises                               [facility] Adult PT Treatment/Exercise - 03/29/21 0001       Exercises   Exercises Shoulder;Lumbar;Knee/Hip      Lumbar Exercises: Stretches   Figure 4 Stretch 1 rep;30 seconds;With overpressure    Figure 4 Stretch Limitations bil foam roller under pelvis    Other Lumbar Stretch Exercise seated ball rollouts 5x5 sec 3-way      Lumbar Exercises: Aerobic   Recumbent Bike L2 x 6' with lumbar heat, PT present to discuss progress and tools for pain management      Lumbar Exercises: Standing   Other Standing Lumbar Exercises march to 90/90 on foam 10x5 sec holds bil    Other Standing Lumbar Exercises 2lb 2-way raises (flex/abd) to 90 deg standing on flat side of BOSU x 10 rounds      Lumbar Exercises: Supine   Bridge with clamshell 10 reps   black loop, open to frog bridge     Lumbar Exercises: Quadruped   Straight  Leg Raise 10 reps    Straight Leg Raises Limitations Rt/Lt hip ext from quadruped on elbows    Opposite Arm/Leg Raise Left arm/Right leg;Right arm/Left leg;5 seconds    Opposite Arm/Leg Raise Limitations 3 reps    Plank on knees and elbows x 15 sec on mat table, chair plank on extended UEs, counter plank on extended UEs - Pt liked chair plank best      Knee/Hip Exercises: Standing   Forward Lunges Limitations backward slider lunge 1x10, Rt/Lt    Side Lunges Left;Right    Side Lunges Limitations slider lunge x 10 each way    Rocker Board 3 minutes                      PT Short Term Goals - 03/13/21 1024       PT SHORT TERM GOAL #1   Title Pt will be ind with initial HEP    Status Achieved      PT SHORT TERM GOAL #2   Title Pt will practice spinal decompression and diaphragmatic breathing at least 5  days/week in the afternoon to address spinal health and abdominal tension.    Status Achieved      PT SHORT TERM GOAL #3   Title Pt will demo reduced abdominal tension/improved resting position of the ribcage with ability to perform lateral costal expansion and abdominal breathing to normalize trunk posture and prep for proper core activation.    Status Partially Met               PT Long Term Goals - 03/13/21 1024       PT LONG TERM GOAL #1   Title Pt to be independent with final HEP with good understanding of how to transition to gym.    Status On-going      PT LONG TERM GOAL #2   Title Pt will report ability to perform standing activities in house and yard for at least 2 hours with min or no exacerbation of pain.    Baseline 1 hour    Status On-going      PT LONG TERM GOAL #3   Title Pt will demo optimal dynamic functional movement for squat, lift, carry using core and good body mechanics to reduce pain with functional tasks.    Status Achieved      PT LONG TERM GOAL #4   Title Pt will report at least 70% reduction in soreness with daily tasks.    Baseline 50% improvement to date on Lt, less so on Rt    Status On-going      PT LONG TERM GOAL #5   Title Pt will improve FOTO score from 52% to 59% to demo improved function.    Baseline 54% from 52%    Status On-going                   Plan - 03/29/21 1016     Clinical Impression Statement Pt using HEP and stress management tools for pain management.  Pain varies depending on the day and is helped by stretching.  Some days HEP manages pain at lower levels and other days relief from HEP lasts 1 hour.  Session focused today on stretching followed by strength/stabilization with good tolerance.  ERO next visit with anticipated extension to focus on strength progression.    PT Frequency 2x / week    PT Duration 8 weeks    PT Treatment/Interventions ADLs/Self Care Home  Management;Electrical  Stimulation;Cryotherapy;Moist Heat;Traction;Therapeutic exercise;Neuromuscular re-education;Patient/family education;Manual techniques;Passive range of motion;Dry needling;Functional mobility training;Spinal Manipulations    PT Next Visit Plan ERO with extension for strength focus, update HEP to include chair plank, quadruped on elbows hip ext,    PT Home Exercise Plan Access Code: [email or web address]    Consulted and Agree with Plan of Care Patient             Patient will benefit from skilled therapeutic intervention in order to improve the following deficits and impairments:     Visit Diagnosis: Chronic bilateral low back pain without sciatica  Muscle weakness (generalized)  Cramp and spasm     Problem List Patient Active Problem List   Diagnosis Date Noted   GAD (generalized anxiety disorder) 10/26/2020   MRSA nasal colonization 10/26/2020   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   Polymyalgia rheumatica ([place]) 10/23/2019   Spinal stenosis, lumbar region without neurogenic claudication 10/23/2019   Lumbar facet arthropathy 10/23/2019   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 [place], [place], [address] Phone: [phone]   Fax:  [phone]  Name: Cheryl Austin MRN: [id number] Date of Birth: [birth year]

## 2021-04-03 ENCOUNTER — Encounter: Payer: Self-pay | Admitting: Physical Therapy

## 2021-04-03 ENCOUNTER — Ambulatory Visit: Payer: Medicare Other | Admitting: Physical Therapy

## 2021-04-03 ENCOUNTER — Other Ambulatory Visit: Payer: Self-pay

## 2021-04-03 DIAGNOSIS — M6281 Muscle weakness (generalized): Secondary | ICD-10-CM

## 2021-04-03 DIAGNOSIS — M545 Low back pain, unspecified: Secondary | ICD-10-CM

## 2021-04-03 DIAGNOSIS — G8929 Other chronic pain: Secondary | ICD-10-CM

## 2021-04-03 NOTE — Therapy (Signed)
[facility] [address], [place], [place], [address] Phone: [phone]   Fax:  [phone]  Physical Therapy Treatment  Patient Details  Name: Cheryl Austin MRN: [id number] Date of Birth: [birth year] Referring Provider (PT): M54.50,G89.29 (ICD-10-CM) - Chronic low back pain, unspecified back pain laterality, unspecified whether sciatica present   Encounter Date: 04/03/2021   PT End of Session - 04/03/21 1305     Visit Number 15    Date for PT Re-Evaluation 05/29/21    Authorization Type UHC Medicare    Progress Note Due on Visit 20    PT Start Time 0930    PT Stop Time 1015    PT Time Calculation (min) 45 min    Activity Tolerance Patient tolerated treatment well    Behavior During Therapy [facility] for tasks assessed/performed             Past Medical History:  Diagnosis Date   Anxiety    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   By lumbar MRI 06/2019, mild   Thyroid disease    Hypothyroid    Past Surgical History:  Procedure Laterality Date   APPENDECTOMY  1961   BREAST SURGERY Bilateral [place]   BSO/Fibroids    There were no vitals filed for this visit.   Subjective Assessment - 04/03/21 0931     Subjective I am 30-40% better than before PT.  I don't grab for pain pills as much as I used to b/c I've learned to stretch when the pain gets bad.  I had variable pain over the weekend but use  the foam roller stretching which helps me.  I don't have the hip pains I used to have so pain is more localized.    Pertinent History PMH: polymyalgia rheumatica, osteoporosis    Limitations Standing;Walking    How long can you stand comfortably? 1-2 hours (sitting improves pain)    How long can you walk comfortably? 1-2 hours    Diagnostic tests lumbar xray 2021L5/S1 mild spondylosis, moderate facet joint arthropathy    Patient Stated Goals be able to stand longer before needing to sit, get rid of pain, get back to gym and learn exercises    Currently in Pain? Yes    Pain Score 4     Pain Location Back    Pain Orientation Lower;Mid    Pain Descriptors / Indicators Aching;Tightness    Pain Type Chronic pain    Pain Onset More than a month ago    Pain Frequency Constant    Aggravating Factors  it's always there    Pain Relieving Factors sitting, laying down, stretching                [facility] PT Assessment - 04/03/21 0001       Assessment   Medical Diagnosis Cheryl Austin, Cheryl Como, MD    Referring Provider (PT) M54.50,G89.29 (ICD-10-CM) - Chronic low back pain, unspecified  back pain laterality, unspecified whether sciatica present    Onset Date/Surgical Date --   2-3 years   Hand Dominance Right    Next MD Visit as needed    Prior Therapy yes, a long time ago      Observation/Other Assessments   Focus on Therapeutic Outcomes (FOTO)  54%, improved by 2% from eval      Strength   Overall Strength Comments bil hips and knees 5/5 throughout      Palpation   Spinal mobility limited bil facet and PAs L4-S1    Palpation comment much improved bil hip TPs in gluteals and piriformis      Pelvic Dictraction   Comment relief                           [facility] Adult PT Treatment/Exercise - 04/03/21 0001       Lumbar Exercises: Aerobic   Recumbent Bike L2 x 6' with lumbar heat, PT present to discuss progress and tools for pain management      Lumbar Exercises: Supine    Straight Leg Raise 10 reps    Straight Leg Raises Limitations bil      Lumbar Exercises: Sidelying   Clam Both;15 reps    Clam Limitations black loop band    Hip Abduction Both;10 reps      Lumbar Exercises: Prone   Other Prone Lumbar Exercises chair plank 2x20 sec      Lumbar Exercises: Quadruped   Straight Leg Raise 10 reps    Straight Leg Raises Limitations Rt/Lt hip ext from quadruped on elbows      Knee/Hip Exercises: Standing   Forward Lunges Limitations backward slider lunge 1x5, Rt/Lt    Side Lunges Left;Right    Side Lunges Limitations slider lunge x 5 each way    SLS 3-way raises 3lb 5x cycles on Rt/Lt each      Shoulder Exercises: Standing   Other Standing Exercises lower trap wall lift offs "Y" x 15, then green loop reverse C off wall x 10      Shoulder Exercises: Power [employer] 15 reps    Row Limitations 25lb seated on green ball    Other Power Tower Exercises diagonal chops 20lb x 15 reps each way                      PT Short Term Goals - 03/13/21 1024       PT SHORT TERM GOAL #1   Title Pt will be ind with initial HEP    Status Achieved      PT SHORT TERM GOAL #2   Title Pt will practice spinal decompression and diaphragmatic breathing at least 5 days/week in the afternoon to address spinal health and abdominal tension.    Status Achieved      PT SHORT TERM GOAL #3   Title Pt will demo reduced abdominal tension/improved resting position of the ribcage with ability to perform lateral costal expansion and abdominal breathing to normalize trunk posture and prep for proper core activation.    Status Partially Met               PT Long Term Goals - 04/03/21 0939       PT LONG TERM GOAL #1   Title Pt to be independent with final HEP with good understanding of how to transition to gym.    Status On-going  PT LONG TERM GOAL #2   Title Pt will report ability to perform standing activities in house and yard for at least 2 hours  with min or no exacerbation of pain.    Baseline some days yes with stretching to help me continue    Status On-going      PT LONG TERM GOAL #3   Title Pt will demo optimal dynamic functional movement for squat, lift, carry using core and good body mechanics to reduce pain with functional tasks.    Baseline needs more practice to create automatic patterns    Status On-going      PT LONG TERM GOAL #4   Title Pt will report at least 70% reduction in soreness with daily tasks.    Baseline 50%    Status On-going      PT LONG TERM GOAL #5   Title Pt will improve FOTO score from 52% to 59% to demo improved function.    Baseline 54% from 52%    Status On-going                   Plan - 04/03/21 1300     Clinical Impression Statement Pt continues to manage daily chronic pain related to spinal arthritis.  She has learned tools for stretching, stress managment, activity modification/pacing, and positioning to help reduce pain throughout the day.  Pain intensity varies day to day.  She has limited spinal mobility at L4-S1 with tenderness along bil facets joints.  Her initial pain that spread across pelvis to bil hips has signif improved with PT with most notable release help being through DN to lateral hips.  She is ready to ramp up strength training focus now that her pain management tools are in place for daily activities.  Strength focused session today was well tolerated and Pt reported less pain end of session indicating good candidate for ongoing PT.  She will benefit from functional endurance training and body mechanics training with and without load to simulate household tasks to protect spine and gain endurance for longer duration of function throughout her daily routines.    Personal Factors and Comorbidities Time since onset of injury/illness/exacerbation    Examination-Activity Limitations Locomotion Level;Stand;Lift;Squat;Carry    Examination-Participation Restrictions Community  Activity;Cleaning;Laundry;Yard Work;Meal Prep    Stability/Clinical Decision Making Stable/Uncomplicated    Rehab Potential Excellent    PT Frequency 2x / week    PT Duration 8 weeks    PT Treatment/Interventions ADLs/Self Care Home Management;Electrical Stimulation;Cryotherapy;Moist Heat;Traction;Therapeutic exercise;Neuromuscular re-education;Patient/family education;Manual techniques;Passive range of motion;Dry needling;Functional mobility training;Spinal Manipulations    PT Next Visit Plan continue strength focus and body mechanics for bend/lift/carry, manual therapy as needed    PT Home Exercise Plan Access Code: [email or web address]    Consulted and Agree with Plan of Care Patient             Patient will benefit from skilled therapeutic intervention in order to improve the following deficits and impairments:  Postural dysfunction, Decreased mobility, Hypomobility, Improper body mechanics, Pain, Increased muscle spasms  Visit Diagnosis: Chronic bilateral low back pain without sciatica  Muscle weakness (generalized)     Problem List Patient Active Problem List   Diagnosis Date Noted   GAD (generalized anxiety disorder) 10/26/2020   MRSA nasal colonization 10/26/2020   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   Polymyalgia rheumatica ([place]) 10/23/2019   Spinal stenosis, lumbar region without neurogenic claudication 10/23/2019   Lumbar facet arthropathy 10/23/2019   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   Spondylosis of cervical region without myelopathy or radiculopathy 05/29/2018   Family history of Parkinson disease 05/21/2016   Seborrheic dermatitis 11/02/2014   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 [place], [place], [address] Phone: [phone]   Fax:  [phone]  Name: Cheryl Austin MRN: [id number] Date of Birth: June 30, 1941

## 2021-04-05 ENCOUNTER — Ambulatory Visit: Payer: Medicare Other | Admitting: Podiatry

## 2021-04-11 ENCOUNTER — Ambulatory Visit: Payer: Medicare Other | Admitting: Rheumatology

## 2021-04-17 ENCOUNTER — Other Ambulatory Visit: Payer: Self-pay | Admitting: Family Medicine

## 2021-04-18 ENCOUNTER — Ambulatory Visit: Payer: Medicare Other | Admitting: Physical Therapy

## 2021-04-21 ENCOUNTER — Ambulatory Visit: Payer: Medicare Other | Attending: Family Medicine | Admitting: Physical Therapy

## 2021-04-21 ENCOUNTER — Encounter: Payer: Self-pay | Admitting: Physical Therapy

## 2021-04-21 ENCOUNTER — Other Ambulatory Visit: Payer: Self-pay

## 2021-04-21 DIAGNOSIS — M6281 Muscle weakness (generalized): Secondary | ICD-10-CM | POA: Insufficient documentation

## 2021-04-21 DIAGNOSIS — M545 Low back pain, unspecified: Secondary | ICD-10-CM | POA: Diagnosis not present

## 2021-04-21 DIAGNOSIS — G8929 Other chronic pain: Secondary | ICD-10-CM | POA: Insufficient documentation

## 2021-04-21 DIAGNOSIS — R252 Cramp and spasm: Secondary | ICD-10-CM | POA: Diagnosis present

## 2021-04-21 NOTE — Therapy (Signed)
[facility] [address], [place], [place], [address] Phone: [phone]   Fax:  [phone]  Physical Therapy Treatment  Patient Details  Name: Cheryl Austin MRN: [id number] Date of Birth: [birth year] Referring Provider (PT): M54.50,G89.29 (ICD-10-CM) - Chronic low back pain, unspecified back pain laterality, unspecified whether sciatica present   Encounter Date: 04/21/2021   PT End of Session - 04/21/21 0943     Visit Number 16    Date for PT Re-Evaluation 05/29/21    Authorization Type [employer] Medicare    Progress Note Due on Visit 20    PT Start Time 0935    PT Stop Time 1016    PT Time Calculation (min) 41 min    Activity Tolerance Patient tolerated treatment well    Behavior During Therapy [facility] for tasks assessed/performed             Past Medical History:  Diagnosis Date   Anxiety    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   By lumbar MRI 06/2019, mild   Thyroid disease    Hypothyroid    Past Surgical History:  Procedure Laterality Date   APPENDECTOMY  1961   BREAST SURGERY Bilateral [place]   BSO/Fibroids    There were no vitals filed for this visit.   Subjective Assessment - 04/21/21 0942     Subjective I had a minor surgery 2 weeks ago so wasn't able to do my HEP much which did both my back from being inactive.    Pertinent History PMH: polymyalgia rheumatica, osteoporosis     Limitations Standing;Walking    How long can you stand comfortably? 1-2 hours (sitting improves pain)    How long can you walk comfortably? 1-2 hours    Diagnostic tests lumbar xray 2021L5/S1 mild spondylosis, moderate facet joint arthropathy    Patient Stated Goals be able to stand longer before needing to sit, get rid of pain, get back to gym and learn exercises    Currently in Pain? Yes    Pain Score 4     Pain Location Back    Pain Orientation Lower;Mid    Pain Descriptors / Indicators Aching;Tightness    Pain Type Chronic pain    Pain Onset More than a month ago    Pain Frequency Intermittent    Aggravating Factors  always there, varies in intensity    Pain Relieving Factors sitting, laying down, stretching, exercise                               [facility] Adult PT Treatment/Exercise - 04/21/21 0001       Lumbar Exercises: Stretches   Double Knee to Chest Stretch 1 rep;20 seconds    Other Lumbar Stretch Exercise windshield wipers x 20 reps supine      Lumbar Exercises: Aerobic   Recumbent Bike L2 x 5' with  lumbar heat, PT present to discuss status      Lumbar Exercises: Standing   Other Standing Lumbar Exercises march to 90/90 1x10 alt LEs, hold 3 sec for control      Lumbar Exercises: Seated   Other Seated Lumbar Exercises on 1/2 BOSU on mat table, round side up, reverse crunch holding 4lb at chest x 20 reps      Lumbar Exercises: Supine   Bridge 10 reps    Bridge with clamshell 10 reps    Bridge with [employer] Limitations black loop    Single Leg Bridge 10 reps    Bridge with [employer] Limitations fig 4    Straight Leg Raise 15 reps    Straight Leg Raises Limitations bil    Other Supine Lumbar Exercises single leg clam 1x10 black loop band      Knee/Hip Exercises: Standing   Forward Lunges Limitations backward slider lunge 2x5, Rt/Lt    Side Lunges Right;Left;2 sets;5 reps    Side Lunges Limitations slider lunges      Knee/Hip Exercises:  Supine   Other Supine Knee/Hip Exercises bil hamstring curls feet on red swiss ball x 15      Shoulder Exercises: Power [employer] 15 reps   2 sets, reduced weight due to lifting restrictions post-op (Pt had small facial surgery, still healing)   Row Limitations 15lb 1x15 each standing and sitting on green swiss ball                      PT Short Term Goals - 03/13/21 1024       PT SHORT TERM GOAL #1   Title Pt will be ind with initial HEP    Status Achieved      PT SHORT TERM GOAL #2   Title Pt will practice spinal decompression and diaphragmatic breathing at least 5 days/week in the afternoon to address spinal health and abdominal tension.    Status Achieved      PT SHORT TERM GOAL #3   Title Pt will demo reduced abdominal tension/improved resting position of the ribcage with ability to perform lateral costal expansion and abdominal breathing to normalize trunk posture and prep for proper core activation.    Status Partially Met               PT Long Term Goals - 04/03/21 0939       PT LONG TERM GOAL #1   Title Pt to be independent with final HEP with good understanding of how to transition to gym.    Status On-going      PT LONG TERM GOAL #2   Title Pt will report ability to perform standing activities in house and yard for at least 2 hours with min or no exacerbation of pain.    Baseline some days yes with stretching to help me continue    Status On-going      PT LONG TERM GOAL #3   Title Pt will demo optimal dynamic functional movement for squat, lift, carry using core and good body mechanics to reduce pain with functional tasks.    Baseline needs more practice to create automatic patterns    Status On-going      PT LONG TERM GOAL #4   Title Pt will report at least 70% reduction in soreness with daily tasks.    Baseline 50%    Status On-going      PT LONG TERM GOAL #5  Title Pt will improve FOTO score from 52% to 59% to demo improved function.     Baseline 54% from 52%    Status On-going                   Plan - 04/21/21 1023     Clinical Impression Statement Pt had minor facial surgery so has had a 2 week break from both PT and HEP due to post-op healing precautions.  She did note some increaesed back pain associated with being more stationary.  She has ongoing lifting restrictions of 15lb x 1 more week so PT adjusted UE ther ex to have more focus on postural alignment and core cueing with dynamic light resistance through UEs.  She was able to fully participate in lower body ther ex and needed demo and cueing to use hip hinge with slider lunges today with improved form with repeated reps.  PT used heat with bike and supine mat ther ex today and discussed how Pt may benefit from microwavable heat pack for moist heat to lumbar region.  Continue along POC with ongoing assessment.    PT Frequency 2x / week    PT Duration 8 weeks    PT Treatment/Interventions ADLs/Self Care Home Management;Electrical Stimulation;Cryotherapy;Moist Heat;Traction;Therapeutic exercise;Neuromuscular re-education;Patient/family education;Manual techniques;Passive range of motion;Dry needling;Functional mobility training;Spinal Manipulations    PT Next Visit Plan continue strength focus and body mechanics for bend/lift/carry, manual therapy as needed    PT Home Exercise Plan Access Code: [email or web address]    Consulted and Agree with Plan of Care Patient             Patient will benefit from skilled therapeutic intervention in order to improve the following deficits and impairments:     Visit Diagnosis: Chronic bilateral low back pain without sciatica  Muscle weakness (generalized)     Problem List Patient Active Problem List   Diagnosis Date Noted   GAD (generalized anxiety disorder) 10/26/2020   MRSA nasal colonization 10/26/2020   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   Polymyalgia rheumatica ([place]) 10/23/2019   Spinal stenosis, lumbar region  without neurogenic claudication 10/23/2019   Lumbar facet arthropathy 10/23/2019   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 [place], [place], [address] Phone: [phone]   Fax:  [phone]  Name: Cheryl Austin MRN: [id number] Date of Birth: 04/03/1941

## 2021-04-26 ENCOUNTER — Other Ambulatory Visit: Payer: Self-pay

## 2021-04-26 ENCOUNTER — Ambulatory Visit (INDEPENDENT_AMBULATORY_CARE_PROVIDER_SITE_OTHER): Payer: Medicare Other | Admitting: Podiatry

## 2021-04-26 ENCOUNTER — Ambulatory Visit (INDEPENDENT_AMBULATORY_CARE_PROVIDER_SITE_OTHER): Payer: Medicare Other

## 2021-04-26 DIAGNOSIS — M722 Plantar fascial fibromatosis: Secondary | ICD-10-CM | POA: Diagnosis not present

## 2021-04-26 NOTE — Progress Notes (Signed)
   HPI: 80 y.o. female presenting today as a new patient referral from Dr. 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  Patient states that it is increasingly aggravating and irritating despite different shoes.  She has to cushion her feet daily to offload pressure from the area.  She was given cortisone shots and there has been no improvement.  She presents today for possible surgical consultation.  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 Corliss Skains   Restless legs syndrome (RLS) 08/02/2017   Spinal stenosis, lumbar region without neurogenic claudication 10/23/2019   By lumbar MRI 06/2019, mild   Thyroid disease    Hypothyroid     Physical Exam: General: The patient is alert and oriented x3 in no acute distress.  Dermatology: Skin is warm, dry and supple bilateral lower extremities. Negative for open lesions or macerations.  Vascular: Palpable pedal pulses bilaterally. No edema or erythema noted. Capillary refill within normal limits.  Neurological: Epicritic and protective threshold grossly intact bilaterally.   Musculoskeletal Exam: Range of motion within normal limits to all pedal and ankle joints bilateral. Muscle strength 5/5 in all groups bilateral.  Palpable tender not adhered soft tissue mass x3 along the plantar fascia medial longitudinal arch left foot.  Findings consistent with 3 small separate plantar fibromas along the plantar fascia  Radiographic Exam:  Normal osseous mineralization. Joint spaces preserved. No  fracture/dislocation/boney destruction.    Assessment: 1.  Plantar fibroma left   Plan of Care:  1. Patient evaluated. X-Rays reviewed.  2.  Today we discussed both surgical and conservative treatment options for the patient.  The patient is very nervous about surgery and currently she is trying to have her home listed on the market for sale.  She is very busy and does not have time for surgery she says.  The pros and cons of surgery were discussed today and all possible complications and details of procedure were explained. 3.  Recommend that the patient arranges her situation and return to the clinic when she is in a better position to proceed with surgery and allow for postoperative downtime.  She is very busy trying to get her house sold and in no position to have surgery.  I reassured the patient that this is not an emergency and we can wait a few months 4.  Return to clinic as needed      Felecia Shelling, DPM [facility]  Dr. Felecia Shelling, DPM    [address] [place], [place] [address]                Office [phone]  Fax [phone]

## 2021-04-28 ENCOUNTER — Ambulatory Visit: Payer: Medicare Other | Admitting: Physical Therapy

## 2021-04-28 ENCOUNTER — Other Ambulatory Visit: Payer: Self-pay | Admitting: Rheumatology

## 2021-04-28 ENCOUNTER — Other Ambulatory Visit: Payer: Self-pay

## 2021-04-28 ENCOUNTER — Encounter: Payer: Self-pay | Admitting: Physical Therapy

## 2021-04-28 DIAGNOSIS — M810 Age-related osteoporosis without current pathological fracture: Secondary | ICD-10-CM

## 2021-04-28 DIAGNOSIS — M545 Low back pain, unspecified: Secondary | ICD-10-CM | POA: Diagnosis not present

## 2021-04-28 DIAGNOSIS — G8929 Other chronic pain: Secondary | ICD-10-CM

## 2021-04-28 DIAGNOSIS — R252 Cramp and spasm: Secondary | ICD-10-CM

## 2021-04-28 DIAGNOSIS — M6281 Muscle weakness (generalized): Secondary | ICD-10-CM

## 2021-04-28 NOTE — Therapy (Signed)
[facility] [address] [place], [place], [address] Phone: [phone]   Fax:  [phone]  Physical Therapy Treatment  Patient Details  Name: Cheryl Austin MRN: [id number] Date of Birth: 1940-11-28 Referring Provider (PT): M54.50,G89.29 (ICD-10-CM) - Chronic low back pain, unspecified back pain laterality, unspecified whether sciatica present   Encounter Date: 04/28/2021   PT End of Session - 04/28/21 0850     Visit Number 17    Date for PT Re-Evaluation 05/29/21    Authorization Type UHC Medicare    Progress Note Due on Visit 20    PT Start Time 0847    PT Stop Time 0927    PT Time Calculation (min) 40 min    Activity Tolerance Patient tolerated treatment well    Behavior During Therapy [facility] for tasks assessed/performed             Past Medical History:  Diagnosis Date   Anxiety    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   By lumbar MRI 06/2019, mild   Thyroid disease    Hypothyroid    Past Surgical History:  Procedure Laterality Date   APPENDECTOMY  1961   BREAST SURGERY Bilateral [place]   BSO/Fibroids    There were no vitals filed for this visit.   Subjective Assessment - 04/28/21 0849     Subjective I am doing pretty well.  I still can't lay on my stomach or be in prone/quadruped due to facial surgery healing.    Pertinent History PMH: polymyalgia rheumatica, osteoporosis     How long can you stand comfortably? 1-2 hours (sitting improves pain)    How long can you walk comfortably? 1-2 hours    Diagnostic tests lumbar xray 2021L5/S1 mild spondylosis, moderate facet joint arthropathy    Patient Stated Goals be able to stand longer before needing to sit, get rid of pain, get back to gym and learn exercises    Currently in Pain? Yes    Pain Score 3     Pain Location Back    Pain Onset More than a month ago    Pain Frequency Intermittent                               [facility] Adult PT Treatment/Exercise - 04/28/21 0001       Exercises   Exercises Shoulder;Lumbar;Knee/Hip      Lumbar Exercises: Stretches   Double Knee to Chest Stretch 1 rep;20 seconds;3 reps    Double Knee to Chest Stretch Limitations propped with soft foam roller under pelvis    Other Lumbar Stretch Exercise windshield wipers 3x10 sec, then x 20 reps A/ROM supine      Lumbar Exercises: Aerobic   Recumbent Bike L2 x 5' PT present to discuss status      Lumbar Exercises: Standing   Other Standing Lumbar Exercises kettlebell  sidebending arm at side x 10 reps each side      Lumbar Exercises: Seated   Other Seated Lumbar Exercises on 1/2 BOSU on mat table, round side up, reverse crunch holding 5lb at chest x 20 reps      Lumbar Exercises: Sidelying   Clam Both;15 reps    Clam Limitations red loop      Knee/Hip Exercises: Standing   Lateral Step Up 10 reps;Hand Hold: 0;Step Height: 6";Left;Right;1 set    Lateral Step Up Limitations contralateral march to 90/90    SLS on foam 1x20 sec, VC for Rt hip glut med, attempted 3-way raises on foam but needed level surface      Knee/Hip Exercises: Seated   Sit to Sand 2 sets;10 reps   hold 10lb kettlebell     Shoulder Exercises: Seated   Row Strengthening;Both;20 reps;Weights    Row Limitations power tower 15lb x 20 reps seated on green ball      Shoulder Exercises: Standing   Other Standing Exercises 3-way raises in SLS on  level surface x 5 cycles, Rt/Lt LE each      Shoulder Exercises: Power [employer] 20 reps    Row Limitations 15lb                       PT Short Term Goals - 03/13/21 1024       PT SHORT TERM GOAL #1   Title Pt will be ind with initial HEP    Status Achieved      PT SHORT TERM GOAL #2   Title Pt will practice spinal decompression and diaphragmatic breathing at least 5 days/week in the afternoon to address spinal health and abdominal tension.    Status Achieved      PT SHORT TERM GOAL #3   Title Pt will demo reduced abdominal tension/improved resting position of the ribcage with ability to perform lateral costal expansion and abdominal breathing to normalize trunk posture and prep for proper core activation.    Status Partially Met               PT Long Term Goals - 04/03/21 0939       PT LONG TERM GOAL #1   Title Pt to be independent with final HEP with good understanding of how to transition to gym.    Status On-going      PT LONG TERM GOAL #2   Title Pt will report ability to perform standing activities in house and yard for at least 2 hours with min or no exacerbation of pain.    Baseline some days yes with stretching to help me continue    Status On-going      PT LONG TERM GOAL #3   Title Pt will demo optimal dynamic functional movement for squat, lift, carry using core and good body mechanics to reduce pain with functional tasks.    Baseline needs more practice to create automatic patterns    Status On-going      PT LONG TERM GOAL #4   Title Pt will report at least 70% reduction in soreness with daily tasks.    Baseline 50%    Status On-going      PT LONG TERM GOAL #5   Title Pt will improve FOTO score from 52% to 59% to demo improved function.    Baseline 54% from 52%    Status On-going  Plan - 04/28/21 0909     Clinical Impression Statement Pt needed VC for alignment of sternum and pubic bone with reps of sit  to stand and within standing ther ex today to avoid hanging trunk on lumbar spine with forward pelvis.  She demos improving LE strength but needed some cues for control of eccentric phase of lateral step ups.  She has difficulty with SLS on foam pad with arm movements and displays Rt hip weakness with slight Trendelenburg with this task.  Continue along POC.    PT Frequency 2x / week    PT Duration 8 weeks    PT Treatment/Interventions ADLs/Self Care Home Management;Electrical Stimulation;Cryotherapy;Moist Heat;Traction;Therapeutic exercise;Neuromuscular re-education;Patient/family education;Manual techniques;Passive range of motion;Dry needling;Functional mobility training;Spinal Manipulations    PT Next Visit Plan continue strength focus and body mechanics for bend/lift/carry, manual therapy as needed    PT Home Exercise Plan Access Code: [email or web address]    Consulted and Agree with Plan of Care Patient             Patient will benefit from skilled therapeutic intervention in order to improve the following deficits and impairments:     Visit Diagnosis: Chronic bilateral low back pain without sciatica  Muscle weakness (generalized)  Cramp and spasm     Problem List Patient Active Problem List   Diagnosis Date Noted   GAD (generalized anxiety disorder) 10/26/2020   MRSA nasal colonization 10/26/2020   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   Polymyalgia rheumatica ([place]) 10/23/2019   Spinal stenosis, lumbar region without neurogenic claudication 10/23/2019   Lumbar facet arthropathy 10/23/2019   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 [place], [place], [address] Phone: [phone]   Fax:  [phone]  Name: Cheryl Austin MRN: [id number] Date of Birth: [birth year]

## 2021-04-28 NOTE — Telephone Encounter (Signed)
Next Visit: 05/11/2021  Last Visit: 11/10/2020  Last Fill: 11/10/2020   DX: Age-related osteoporosis without current pathological fracture   Current Dose per office note 11/10/2020: Fosamax 70 mg p.o. weekly.  Labs: 11/10/2020 CMP and vitamin D are within normal limits.  Okay to refill Fosamax?

## 2021-04-28 NOTE — Progress Notes (Signed)
Office Visit Note  Patient: Cheryl Austin             Date of Birth: 08-Mar-1941           MRN: [id number]             PCP: Leamon Arnt, MD Referring: Leamon Arnt, MD Visit Date: 05/11/2021 Occupation: @GUAROCC @  Subjective:  Medication monitoring  History of Present Illness: Cheryl Austin is a 80 y.o. female with history of polymyalgia rheumatica and osteoporosis.  Patient is not currently taking any immunosuppressive agents or prednisone.  She denies any signs or symptoms of a polymyalgia rheumatica flare.  She is not experiencing any pain or stiffness in her shoulder or hip joints at this time.  She has no difficulty raising her arms above her head or difficulty rising from a seated position.  She is currently going to physical therapy for her lower back.  She denies any other joint pain or joint swelling at this time.   She remains on Fosamax 70 mg 1 tablet by mouth once weekly for management of osteoporosis.  She has been tolerating Fosamax without any side effects.  She continues taking calcium and vitamin D supplement as recommended.  She denies any falls or fractures.  She denies any jaw pain.   Activities of Daily Living:  Patient reports morning stiffness for 10 minutes.   Patient Denies nocturnal pain.  Difficulty dressing/grooming: Denies Difficulty climbing stairs: Denies Difficulty getting out of chair: Denies Difficulty using hands for taps, buttons, cutlery, and/or writing: Denies  Review of Systems  Constitutional:  Positive for fatigue.  HENT:  Negative for mouth sores, mouth dryness and nose dryness.   Eyes:  Positive for dryness. Negative for pain, itching and visual disturbance.  Respiratory:  Negative for cough, hemoptysis, shortness of breath and difficulty breathing.   Cardiovascular:  Negative for chest pain, palpitations and swelling in legs/feet.  Gastrointestinal:  Negative for abdominal pain, blood in stool, constipation and diarrhea.   Endocrine: Negative for increased urination.  Genitourinary:  Negative for painful urination.  Musculoskeletal:  Positive for joint pain, joint pain and morning stiffness. Negative for joint swelling, myalgias, muscle weakness, muscle tenderness and myalgias.  Skin:  Negative for color change, rash and redness.  Allergic/Immunologic: Negative for susceptible to infections.  Neurological:  Positive for dizziness and memory loss. Negative for numbness, headaches and weakness.  Hematological:  Negative for swollen glands.  Psychiatric/Behavioral:  Negative for confusion and sleep disturbance.    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   Moderate episode of recurrent major depressive disorder ([place]) 06/22/2013   Dermatitis, atopic 01/30/2013   Menopausal hot flushes 01/30/2013   MRSA cellulitis 01/30/2013   Osteoarthritis, 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   Insomnia 03/18/2012   Hypothyroidism 12/18/2010    Past Medical History:  Diagnosis Date   Anxiety    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 Estanislado Pandy   Restless legs syndrome  (RLS) 08/02/2017   Spinal stenosis, lumbar region without neurogenic claudication 10/23/2019   By lumbar MRI 06/2019, mild   Thyroid disease    Hypothyroid    Family History  Problem Relation Age of Onset   Lupus Mother    Asthma Mother    Parkinson's disease Mother    Depression Mother    Heart disease Father    Hyperlipidemia Father    Stroke Father    Alcohol abuse Daughter    Depression Daughter    Past Surgical History:  Procedure Laterality Date   [place]   BSO/Fibroids   Social History   Social History Narrative   Drinks 2-3 caffeine drinks a day    Immunization History  Administered Date(s) Administered   DT (Pediatric) 05/21/2008   Fluad Quad(high Dose 65+) 04/27/2020   Influenza Split 06/20/2010, 05/14/2011, 06/18/2012, 06/22/2013, 07/02/2014, 07/05/2015, 05/21/2016, 05/29/2018   Influenza, High Dose Seasonal PF 06/18/2012, 06/18/2012, 06/22/2013, 06/22/2013, 07/02/2014, 07/02/2014, 07/05/2015, 07/05/2015, 05/21/2016, 05/21/2016, 08/02/2017, 04/27/2020   Influenza, Seasonal, Injecte, Preservative Fre 05/14/2011   Influenza,inj,Quad PF,6+ Mos 05/21/2016, 05/29/2018   Influenza,trivalent, recombinat, inj, PF 05/14/2011   Influenza-Unspecified 05/14/2011, 06/18/2012, 06/22/2013, 07/02/2014, 07/05/2015   PFIZER Comirnaty(Gray Top)Covid-19 Tri-Sucrose Vaccine 10/11/2019, 11/04/2019   PFIZER(Purple Top)SARS-COV-2 Vaccination 10/11/2019, 11/04/2019   Pneumococcal Conjugate-13 07/04/2014, 07/04/2014   Pneumococcal Polysaccharide-23 03/20/2005, 06/22/2011   Pneumococcal-Unspecified 03/20/2005, 06/22/2011   Td 05/21/2008   Tdap 05/21/2008, 06/22/2011, 06/19/2013     Objective: Vital Signs: BP 108/68 (BP Location: Left Arm, Patient Position: Sitting, Cuff Size: Normal)   Pulse 73   Ht 5' 3"  (1.6 m)   Wt 119 lb 3.2 oz (54.1 kg)   BMI 21.12 kg/m     Physical Exam Vitals and nursing note reviewed.  Constitutional:      Appearance: She is well-developed.  HENT:     Head: Normocephalic and atraumatic.  Eyes:     Conjunctiva/sclera: Conjunctivae normal.  Pulmonary:     Effort: Pulmonary effort is normal.  Abdominal:     Palpations: Abdomen is soft.  Musculoskeletal:     Cervical back: Normal range of motion.  Skin:    General: Skin is warm and dry.     Capillary Refill: Capillary refill takes less than 2 seconds.  Neurological:     Mental Status: She is alert and oriented to person, place, and time.  Psychiatric:        Behavior: Behavior normal.     Musculoskeletal Exam: C-spine has good range of motion with no discomfort.  Shoulder joints, elbow joints, wrist joints, MCPs, PIPs, DIPs have good range of motion with no synovitis.  PIP and DIP prominence consistent with osteoarthritis of both hands.  Hip joints have good range of motion with no discomfort.  Knee joints have good range of motion with no warmth or effusion.  Ankle joints have good range of motion with no tenderness or joint swelling.  No tenderness over MTP joints.  No tenderness over trochanteric bursa.  CDAI Exam: CDAI Score: -- Patient Global: --; Provider Global: -- Swollen: --; Tender: --  Joint Exam 05/11/2021   No joint exam has been documented for this visit   There is currently no information documented on the homunculus. Go to the Rheumatology activity and complete the homunculus joint exam.  Investigation: No additional findings.  Imaging: DG Foot Complete Left  Result Date: 04/26/2021 Please see detailed radiograph report in office note.   Recent Labs: Lab Results  Component Value Date   WBC 4.3 04/19/2020   HGB 12.7 04/19/2020   PLT 239 04/19/2020   NA 137 11/10/2020   K 4.1 11/10/2020   CL 101 11/10/2020   CO2 29 11/10/2020   GLUCOSE 92 11/10/2020   BUN 12 11/10/2020   CREATININE 0.71 11/10/2020   BILITOT 0.5 11/10/2020   ALKPHOS  91 05/14/2019   AST 24 11/10/2020   ALT 18 11/10/2020   PROT 6.3 11/10/2020   ALBUMIN 4.3 05/14/2019   CALCIUM 9.4 11/10/2020   GFRAA 95 11/10/2020   QFTBGOLDPLUS NEGATIVE 07/06/2019    Speciality Comments: No specialty comments available.  Procedures:  No procedures performed Allergies: Patient has no known allergies.   Assessment / Plan:     Visit Diagnoses: Polymyalgia rheumatica ([place]): She has not had any signs or symptoms of a polymyalgia rheumatica flare recently.  She is not experiencing any pain or stiffness in both shoulder joints or both hips.  She has no difficulty raising her arms above her head or rising from a seated position.  No signs of inflammatory Tritus were noted on examination today.  No tenderness or synovitis was noted.  She is not taking any immunosuppressive agents or prednisone currently.  ESR was 2 on 04/19/2020.  She was advised to notify us if she develops any new or worsening symptoms.  She will follow-up in the office in 6 months.  High risk medication use - Patient discontinued Methotrexate on her own in August 2021.  She is not currently taking any immunosuppressive agents or prednisone.  She remains on Fosamax for management of osteoporosis.  CBC and CMP will be drawn today.- Plan: COMPLETE METABOLIC PANEL WITH GFR, CBC with Differential/Platelet  Primary osteoarthritis of both hands: PIP and DIP thickening consistent with osteoarthritis of both hands.  No tenderness or inflammation was noted on examination today.  She was able to make a complete fist bilaterally.  Spondylosis of cervical region without myelopathy or radiculopathy: She has good range of motion with no discomfort at this time.  No symptoms of radiculopathy.  Spondylosis of lumbar region without myelopathy or radiculopathy: Chronic pain.  She is currently going to physical therapy on a weekly basis.  Age-related osteoporosis without current pathological fracture - DEXA 10/25/2020 T-score:  -2.6, BMD: 0.561 right femoral neck. DEXA 07/10/18: RFN T-score -2.3. She was a started on Fosamax on December 2020 which she discontinued after 8 months.  She was restarted on Fosamax at her last office visit on 11/10/2020.  She has been tolerating Fosamax without any side effects.  She continues to take a calcium and vitamin D supplement as recommended.  Her vitamin D level was 67 on 11/10/2020.  We will recheck vitamin D, CBC, and CMP today. She has not had any recent falls or fractures.  No jaw pain. She will remain on Fosamax as prescribed.  Her next bone density will be due in March 2024.  Vitamin D deficiency - She is taking calcium and vitamin D supplement on a daily basis.  Her vitamin D level was 67 on 11/10/2020.  Vitamin D level will be  checked today.  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  Globulin has been consistently low but stable since 11/03/2019.  Most recent globulin was checked on 11/10/2020 and was 1.8.  The patient would like to check SPEP and immunoglobulins today.  Plan: Serum protein electrophoresis with reflex, IgG, IgA, IgM  Other medical conditions are listed as follows:  Other insomnia  Seborrheic dermatitis  History of hypothyroidism  History of depression  History of MRSA infection  Family history of Parkinson disease  Family history of systemic lupus erythematosus (SLE) in mother   Orders: Orders Placed This Encounter  Procedures   COMPLETE METABOLIC PANEL WITH GFR   CBC with Differential/Platelet   Serum protein electrophoresis with reflex   IgG, IgA, IgM   VITAMIN D 25 Hydroxy (Vit-D Deficiency, Fractures)   No orders of the defined types were placed in this encounter.    Follow-Up Instructions: Return in about 6 months (around 11/08/2021) for PMR, Osteoporosis.   Ofilia Neas, PA-C  Note - This record has been created using Dragon  software.  Chart creation errors have been sought, but may not always  have been located. Such creation errors do not reflect on  the standard of medical care.

## 2021-05-01 ENCOUNTER — Encounter: Payer: Medicare Other | Admitting: Family Medicine

## 2021-05-02 ENCOUNTER — Other Ambulatory Visit: Payer: Self-pay

## 2021-05-02 ENCOUNTER — Ambulatory Visit: Payer: Medicare Other | Admitting: Physical Therapy

## 2021-05-02 ENCOUNTER — Encounter: Payer: Self-pay | Admitting: Physical Therapy

## 2021-05-02 DIAGNOSIS — M545 Low back pain, unspecified: Secondary | ICD-10-CM | POA: Diagnosis not present

## 2021-05-02 DIAGNOSIS — M6281 Muscle weakness (generalized): Secondary | ICD-10-CM

## 2021-05-02 DIAGNOSIS — G8929 Other chronic pain: Secondary | ICD-10-CM

## 2021-05-02 NOTE — Therapy (Signed)
[facility] [address], [place], [place], [address] Phone: [phone]   Fax:  [phone]  Physical Therapy Treatment  Patient Details  Name: Cheryl Austin MRN: [id number] Date of Birth: 01-Sep-1940 Referring Provider (PT): M54.50,G89.29 (ICD-10-CM) - Chronic low back pain, unspecified back pain laterality, unspecified whether sciatica present   Encounter Date: 05/02/2021   PT End of Session - 05/02/21 1105     Visit Number 18    Date for PT Re-Evaluation 05/29/21    Authorization Type [employer] Medicare    Progress Note Due on Visit 20    PT Start Time 1100    PT Stop Time 1145    PT Time Calculation (min) 45 min    Activity Tolerance Patient tolerated treatment well    Behavior During Therapy [facility] for tasks assessed/performed             Past Medical History:  Diagnosis Date   Anxiety    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   By lumbar MRI 06/2019, mild   Thyroid disease    Hypothyroid    Past Surgical History:  Procedure Laterality Date   APPENDECTOMY  1961   BREAST SURGERY Bilateral [place]   BSO/Fibroids    There were no vitals filed for this visit.   Subjective Assessment - 05/02/21 1102     Subjective I am feeling like my pain level is getting more consistent at a 3/10 which is good.  If things start to get bad I can sit for a little bit of time and I can get back to things.     Pertinent History PMH: polymyalgia rheumatica, osteoporosis    Limitations Standing;Walking    How long can you stand comfortably? 1-2 hours (sitting improves pain)    How long can you walk comfortably? 1-2 hours    Diagnostic tests lumbar xray 2021L5/S1 mild spondylosis, moderate facet joint arthropathy    Patient Stated Goals be able to stand longer before needing to sit, get rid of pain, get back to gym and learn exercises    Currently in Pain? Yes    Pain Score 3     Pain Location Back    Pain Orientation Lower;Mid    Pain Descriptors / Indicators Aching;Tightness    Pain Type Chronic pain    Pain Onset More than a month ago    Pain Frequency Intermittent                               [facility] Adult PT Treatment/Exercise - 05/02/21 0001       Exercises   Exercises Shoulder;Lumbar;Knee/Hip      Lumbar Exercises: Stretches   Double Knee to Chest Stretch 1 rep;20 seconds;3 reps    Double Knee to Chest Stretch Limitations propped with soft foam roller under pelvis    Other Lumbar Stretch Exercise windshield  wipers 3x10 sec    Other Lumbar Stretch Exercise seated ball rollouts 3-way 3x10 sec, PT VC and TC for max lumbar ROM      Lumbar Exercises: Aerobic   Recumbent Bike L2 x 5' PT present to discuss status      Lumbar Exercises: Standing   Other Standing Lumbar Exercises march on foam pad to 90/90 x 20 alt      Lumbar Exercises: Seated   Other Seated Lumbar Exercises on 1/2 BOSU on mat table, round side up, reverse crunch holding 5lb at chest x 20 reps, chest press with reach rep      Lumbar Exercises: Supine   Dead Bug 20 reps    Dead Bug Limitations bil arms holding 5lb, then single arm opp leg 5lb 1x10 each    Single Leg Bridge 10 reps    Bridge with Ball Squeeze Limitations bil, fig 4 bridge      Knee/Hip Exercises: Standing   Lateral Step Up Both;10 reps;Step Height: 6";Hand Hold: 0    Lateral Step Up Limitations contralateral march to 90/90       Knee/Hip Exercises: Supine   Other Supine Knee/Hip Exercises 90/90 unilateral leg press with contralteral hip flexor stabilization yellow loop around feet x 20 reps alt, used foam roller under pelvis      Shoulder Exercises: Supine   Other Supine Exercises bil UE extension from 90 to table green tband PT providing anchor, LEs on red ball      Shoulder Exercises: Standing   Diagonals Right;Left;5 reps    Diagonals Weight (lbs) 3    Diagonals Limitations in SLS on foam pad    Other Standing Exercises pallof press out and up alt x 5 rounds, facing each way      Shoulder Exercises: Power [employer] 25 reps    Row Limitations 20lb seated on green ball, TC for trunk alignment maintenance                       PT Short Term Goals - 03/13/21 1024       PT SHORT TERM GOAL #1   Title Pt will be ind with initial HEP    Status Achieved      PT SHORT TERM GOAL #2   Title Pt will practice spinal decompression and diaphragmatic breathing at least 5 days/week in the afternoon to address spinal health and abdominal tension.    Status Achieved      PT SHORT TERM GOAL #3   Title Pt will demo reduced abdominal tension/improved resting position of the ribcage with ability to perform lateral costal expansion and abdominal breathing to normalize trunk posture and prep for proper core activation.    Status Partially Met               PT Long Term Goals - 05/02/21 1152       PT LONG TERM GOAL #1   Title Pt to be independent with final HEP with good understanding of how to transition to gym.    Status On-going      PT LONG TERM GOAL #2   Title Pt will report ability to perform standing activities in house and yard for at least 2 hours with min or no exacerbation of pain.    Status Achieved      PT LONG TERM GOAL #3   Title Pt will demo optimal dynamic functional movement for squat, lift, carry using core and good  body mechanics to reduce pain with functional tasks.     Baseline needs more practice to create automatic patterns    Status On-going      PT LONG TERM GOAL #4   Title Pt will report at least 70% reduction in soreness with daily tasks.    Baseline 60%    Status On-going      PT LONG TERM GOAL #5   Title Pt will improve FOTO score from 52% to 59% to demo improved function.    Baseline 54% from 52%    Status On-going                   Plan - 05/02/21 1145     Clinical Impression Statement Pt reports her pain is becoming more consistent without high spikes of pain, rated 3/10.  She is using strategies to take seated breaks when pain starts to increase during the day which buys her more time to continue with daily activities.  Pt is able to work consistently for full session and demos much improved activation of upper and lower abdominals such that PT advanced core challenges today.  Pt was able to perform SLS march with UE diag dumbbell on foam x 3 reps each on Rt/Lt before losing balance today.  She needed TCs with initial reps of weighted sit to stand to achieve properly aligned standing posture which she did demo improved final reps with after cueing.  Pt is making ongoing excellent progress toward improved strength, postural awareness and strength which is yielding improved control of chronic pain.    PT Frequency 2x / week    PT Duration 8 weeks    PT Treatment/Interventions ADLs/Self Care Home Management;Electrical Stimulation;Cryotherapy;Moist Heat;Traction;Therapeutic exercise;Neuromuscular re-education;Patient/family education;Manual techniques;Passive range of motion;Dry needling;Functional mobility training;Spinal Manipulations    PT Next Visit Plan continue strength, postural alignment within ther ex, increased core challenge with overlay of weighted UE/LE    PT Home Exercise Plan Access Code: [email or web address]    Consulted and Agree with Plan of Care Patient             Patient will benefit from skilled therapeutic intervention in  order to improve the following deficits and impairments:     Visit Diagnosis: Chronic bilateral low back pain without sciatica  Muscle weakness (generalized)     Problem List Patient Active Problem List   Diagnosis Date Noted   GAD (generalized anxiety disorder) 10/26/2020   MRSA nasal colonization 10/26/2020   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   Polymyalgia rheumatica ([place]) 10/23/2019   Spinal stenosis, lumbar region without neurogenic claudication 10/23/2019   Lumbar facet arthropathy 10/23/2019   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 [place], [place], [address] Phone: [phone]   Fax:  [phone]  Name: Pressley Barsky MRN: [id number] Date of Birth: 08-Mar-1941

## 2021-05-05 ENCOUNTER — Ambulatory Visit: Payer: Medicare Other | Admitting: Physical Therapy

## 2021-05-05 ENCOUNTER — Encounter: Payer: Self-pay | Admitting: Physical Therapy

## 2021-05-05 ENCOUNTER — Other Ambulatory Visit: Payer: Self-pay

## 2021-05-05 DIAGNOSIS — M545 Low back pain, unspecified: Secondary | ICD-10-CM | POA: Diagnosis not present

## 2021-05-05 DIAGNOSIS — R252 Cramp and spasm: Secondary | ICD-10-CM

## 2021-05-05 DIAGNOSIS — G8929 Other chronic pain: Secondary | ICD-10-CM

## 2021-05-05 DIAGNOSIS — M6281 Muscle weakness (generalized): Secondary | ICD-10-CM

## 2021-05-05 NOTE — Therapy (Signed)
[facility] [address], [place], [place], [address] Phone: [phone]   Fax:  [phone]  Physical Therapy Treatment  Patient Details  Name: Cheryl Austin MRN: [id number] Date of Birth: [birth year] Referring Provider (PT): M54.50,G89.29 (ICD-10-CM) - Chronic low back pain, unspecified back pain laterality, unspecified whether sciatica present   Encounter Date: 05/05/2021   PT End of Session - 05/05/21 0937     Visit Number 19    Date for PT Re-Evaluation 05/29/21    Authorization Type UHC Medicare    Progress Note Due on Visit 20    PT Start Time 0932    PT Stop Time 1011    PT Time Calculation (min) 39 min    Activity Tolerance Patient tolerated treatment well    Behavior During Therapy [facility] for tasks assessed/performed             Past Medical History:  Diagnosis Date   Anxiety    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   By lumbar MRI 06/2019, mild   Thyroid disease    Hypothyroid    Past Surgical History:  Procedure Laterality Date   APPENDECTOMY  1961   BREAST SURGERY Bilateral [place]   BSO/Fibroids    There were no vitals filed for this visit.   Subjective Assessment - 05/05/21 0935     Subjective I continue to manage my pain around a 3/10. Mornings are the worst for me.    Pertinent History PMH: polymyalgia rheumatica, osteoporosis    Limitations Standing;Walking    How  long can you stand comfortably? 1-2 hours (sitting improves pain)    How long can you walk comfortably? 1-2 hours    Diagnostic tests lumbar xray 2021L5/S1 mild spondylosis, moderate facet joint arthropathy    Patient Stated Goals be able to stand longer before needing to sit, get rid of pain, get back to gym and learn exercises    Currently in Pain? Yes    Pain Score 3     Pain Location Back    Pain Type Chronic pain    Pain Frequency Intermittent    Aggravating Factors  AM, overdoing it    Pain Relieving Factors lay down and stretch with foam roller                               [facility] Adult PT Treatment/Exercise - 05/05/21 0001       Exercises   Exercises Shoulder;Lumbar;Knee/Hip      Lumbar Exercises: Stretches   Double Knee to Chest Stretch 1 rep;20 seconds;3 reps    Double Knee to Chest Stretch Limitations propped with soft foam roller under pelvis    Other Lumbar Stretch Exercise windshield wipers 2x10 sec      Lumbar Exercises: Standing   Heel Raises Limitations heel toe raises no UE support x 20 reps  Functional Squats 10 reps    Functional Squats Limitations on flat side of BOSU no UE support      Lumbar Exercises: Seated   Other Seated Lumbar Exercises on 1/2 BOSU on mat table, round side up, reverse crunch holding 6lb at chest x 20 reps, chest press with reach rep      Lumbar Exercises: Supine   Dead Bug 20 reps    Dead Bug Limitations bil arms holding 5lb, then single arm opp leg 5lb 1x10 each, foam roller under pelvis    Bridge Limitations feet on red ball knees straight 1x10 then ham curls 1x10 in bridge feet on ball    Straight Leg Raise 10 reps    Straight Leg Raises Limitations 3lb ankle weight, Rt, Lt      Lumbar Exercises: Sidelying   Hip Abduction Both;10 reps;Weights    Hip Abduction Weights (lbs) 3    Other Sidelying Lumbar Exercises 3x10" side planks bil      Lumbar Exercises: Quadruped   Other Quadruped Lumbar Exercises hip  ext 3lb 1x10, Rt, Lt on elbows      Knee/Hip Exercises: Standing   Lateral Step Up 1 set;10 reps;Hand Hold: 1;Left;Right    Lateral Step Up Limitations onto rounded side of BOSU    Forward Step Up Limitations SLS on foam march taps to 3rd step 1x10 each side    SLS with Vectors SLS on foam with 3-way contralateral taps x 5 rounds each      Shoulder Exercises: Power Development worker, community 20 reps    Extension Limitations 20lb seated on green ball, VC for knitted ribcage and postural alignment sternum over pubic bone    Row 25 reps    Row Limitations 20lb seated on green ball, TC for trunk alignment maintenance                       PT Short Term Goals - 03/13/21 1024       PT SHORT TERM GOAL #1   Title Pt will be ind with initial HEP    Status Achieved      PT SHORT TERM GOAL #2   Title Pt will practice spinal decompression and diaphragmatic breathing at least 5 days/week in the afternoon to address spinal health and abdominal tension.    Status Achieved      PT SHORT TERM GOAL #3   Title Pt will demo reduced abdominal tension/improved resting position of the ribcage with ability to perform lateral costal expansion and abdominal breathing to normalize trunk posture and prep for proper core activation.    Status Partially Met               PT Long Term Goals - 05/02/21 1152       PT LONG TERM GOAL #1   Title Pt to be independent with final HEP with good understanding of how to transition to gym.    Status On-going      PT LONG TERM GOAL #2   Title Pt will report ability to perform standing activities in house and yard for at least 2 hours with min or no exacerbation of pain.    Status Achieved      PT LONG TERM GOAL #3   Title Pt will demo optimal dynamic functional movement for squat, lift, carry using core and good body mechanics to reduce pain with functional tasks.    Baseline needs more practice to create automatic patterns  Status On-going      PT  LONG TERM GOAL #4   Title Pt will report at least 70% reduction in soreness with daily tasks.    Baseline 60%    Status On-going      PT LONG TERM GOAL #5   Title Pt will improve FOTO score from 52% to 59% to demo improved function.    Baseline 54% from 52%    Status On-going                   Plan - 05/05/21 1158     Clinical Impression Statement Pt continues to report pain on waking in AM and if she overdoes it.  Pain no longer goes higher than a 3/10 demonstrating much improved control over chronic pain.  Pt displays challenges with single leg balance on compliant surface with overlay of UE or LE movement but this is improving.  She needed intermittent cueing for ribcage knitting and postural control for alignment with some dynamic trunk ther ex today but less cues needed than at previous visits.  Continue along POC.    PT Frequency 2x / week    PT Duration 8 weeks    PT Treatment/Interventions ADLs/Self Care Home Management;Electrical Stimulation;Cryotherapy;Moist Heat;Traction;Therapeutic exercise;Neuromuscular re-education;Patient/family education;Manual techniques;Passive range of motion;Dry needling;Functional mobility training;Spinal Manipulations    PT Next Visit Plan 20th visit PN next time, continue strength, postural alignment within ther ex, increased core challenge with overlay of weighted UE/LE    PT Home Exercise Plan Access Code: [email or web address]    Consulted and Agree with Plan of Care Patient             Patient will benefit from skilled therapeutic intervention in order to improve the following deficits and impairments:     Visit Diagnosis: Chronic bilateral low back pain without sciatica  Muscle weakness (generalized)  Cramp and spasm     Problem List Patient Active Problem List   Diagnosis Date Noted   GAD (generalized anxiety disorder) 10/26/2020   MRSA nasal colonization 10/26/2020   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   Polymyalgia rheumatica  ([place]) 10/23/2019   Spinal stenosis, lumbar region without neurogenic claudication 10/23/2019   Lumbar facet arthropathy 10/23/2019   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, [place], [place], [address] Phone: [phone]   Fax:  [phone]  Name: Cheryl Austin MRN: [id number] Date of Birth: 1940-08-21

## 2021-05-08 ENCOUNTER — Ambulatory Visit: Payer: Medicare Other | Admitting: Physical Therapy

## 2021-05-08 ENCOUNTER — Encounter: Payer: Self-pay | Admitting: Physical Therapy

## 2021-05-08 ENCOUNTER — Other Ambulatory Visit: Payer: Self-pay | Admitting: Family Medicine

## 2021-05-08 ENCOUNTER — Other Ambulatory Visit: Payer: Self-pay

## 2021-05-08 DIAGNOSIS — M545 Low back pain, unspecified: Secondary | ICD-10-CM | POA: Diagnosis not present

## 2021-05-08 DIAGNOSIS — G8929 Other chronic pain: Secondary | ICD-10-CM

## 2021-05-08 DIAGNOSIS — R252 Cramp and spasm: Secondary | ICD-10-CM

## 2021-05-08 DIAGNOSIS — M6281 Muscle weakness (generalized): Secondary | ICD-10-CM

## 2021-05-08 NOTE — Therapy (Signed)
[facility] [address] [place], [place], [address] Phone: [phone]   Fax:  [phone]  Physical Therapy Treatment  Patient Details  Name: Cheryl Austin MRN: [id number] Date of Birth: 07-17-1941 Referring Provider (PT): M54.50,G89.29 (ICD-10-CM) - Chronic low back pain, unspecified back pain laterality, unspecified whether sciatica present  Progress Note Reporting Period 03/15/21 to 05/08/21  See note below for Objective Data and Assessment of Progress/Goals.      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   By lumbar MRI 06/2019, mild   Thyroid disease    Hypothyroid    Past Surgical History:  Procedure Laterality Date   [place]   BSO/Fibroids    There were no vitals filed for this visit.   Subjective Assessment - 05/08/21 0935     Subjective Pt feels 60% improvement in pain since starting PT.   She continues to have pain on waking in AM or if she overdoes it.    Pertinent History PMH: polymyalgia rheumatica, osteoporosis    Limitations Standing;Walking    How long can you stand comfortably? 1-2 hours (sitting improves pain)    How long can you walk comfortably? 1-2 hours    Diagnostic tests lumbar xray 2021L5/S1 mild spondylosis, moderate facet joint arthropathy    Patient Stated Goals be able to stand longer before needing to sit, get rid of pain, get back to gym and learn exercises    Currently in Pain? Yes    Pain Score 4     Pain Location Back    Pain Orientation Lower;Mid    Pain Descriptors / Indicators Aching;Tightness    Pain Type Chronic pain    Pain Onset More than a month ago    Pain Frequency Intermittent    Aggravating Factors  AM, overdoing it    Pain Relieving Factors stretch, exercise, pace activity                [facility] PT Assessment - 05/08/21 0001       Assessment   Medical Diagnosis Hilts, Michael, MD    Referring Provider (PT) M54.50,G89.29 (ICD-10-CM) - Chronic low back pain, unspecified back pain laterality, unspecified whether sciatica present    Onset Date/Surgical Date --   2-3  years   Hand Dominance Right    Next MD Visit as needed    Prior Therapy yes, a long time ago      Observation/Other Assessments   Focus on Therapeutic Outcomes (FOTO)  70%, met goal      Strength   Overall Strength Comments mild Rt hip ER and abd weakness compared to Lt, 4+/5 Rt hip ER and abd      Palpation   Spinal mobility limited bil facet and PAs L4-S1                           [facility] Adult PT Treatment/Exercise - 05/08/21 0001       Exercises   Exercises Shoulder;Lumbar;Knee/Hip      Lumbar Exercises: Stretches   Double Knee to Chest Stretch 1 rep;20 seconds;3 reps    Double Knee to Chest Stretch Limitations propped with soft foam roller under pelvis    Figure 4 Stretch 1 rep;30 seconds      Lumbar Exercises: Supine   Dead Bug  20 reps    Dead Bug Limitations 5lb dumbbell UE, foam roller under pelvis    Bridge Limitations feet on red ball knees straight 1x10 then ham curls 1x10 in bridge feet on ball      Lumbar Exercises: Sidelying   Clam Both;20 reps    Clam Limitations red loop, end range reps    Other Sidelying Lumbar Exercises 3x15" side planks bil      Lumbar Exercises: Quadruped   Opposite Arm/Leg Raise 10 reps;Left arm/Right leg;Right arm/Left leg    Other Quadruped Lumbar Exercises hip ext rainbows x 5 each Rt/Lt      Knee/Hip Exercises: Standing   Forward Step Up Limitations SLS on foam with 3lb diag dumbbell      Shoulder Exercises: Power Tower   Extension 20 reps    Extension Limitations 20lb seated on green ball, VC for knitted ribcage and postural alignment sternum over pubic bone    Row 25 reps    Row Limitations 20lb seated on green ball, TC for trunk alignment maintenance                 Balance Exercises - 05/08/21 0001       Balance Exercises: Standing   SLS Eyes closed;2 reps;10 secs                  PT Short Term Goals - 03/13/21 1024       PT SHORT TERM GOAL #1   Title Pt will be ind with initial HEP    Status Achieved      PT SHORT TERM GOAL #2   Title Pt will practice spinal decompression and diaphragmatic breathing at least 5 days/week in the afternoon to address spinal health and abdominal tension.    Status Achieved      PT SHORT TERM GOAL #3   Title Pt will demo reduced abdominal tension/improved resting position of the ribcage with ability to perform lateral costal expansion and abdominal breathing to normalize trunk posture and prep for proper core activation.    Status Partially Met               PT Long Term Goals - 05/08/21 0940       PT LONG TERM GOAL #1   Title Pt to be independent with final HEP with good understanding of how to transition to gym.    Baseline PT beginning  to talk to Pt about how to incorporate ther ex to gym  setting    Status On-going      PT LONG TERM GOAL #2   Title Pt will report ability to perform standing activities in house and yard for at least 2 hours with min or no exacerbation of pain.    Status Achieved      PT LONG TERM GOAL #3   Title Pt will demo optimal dynamic functional movement for squat, lift, carry using core and good body mechanics to reduce pain with functional tasks.    Baseline improving awareness of dynamic postural alignment, needs less cueing    Status On-going      PT LONG TERM GOAL #4   Title Pt will report at least 70% reduction in soreness with daily tasks.    Baseline 60%    Status On-going      PT LONG TERM GOAL #5   Title Pt will improve FOTO score from 52% to 59% to demo improved function.    Baseline 70%    Status Achieved                   Plan - 05/08/21 1114     Clinical Impression Statement Pt reports overall improvement in daily pain by 60%.  FOTO score has improved to 70% from 52% at initial visit demo'ing improved function, meeting LTG.  Pt continues to report pain on waking in AM and if she overdoes it. Pain no longer goes higher than a 3/10 demonstrating much improved control over chronic pain.  She is using pacing and positional rest strategies when pain increases.  She demos improving dynamic postural control.  She has 5/5 LE strength with exception of mild Rt hip ER and abd weakness.  She is unable to balance in SLS with eyes closed demo'ing signif dependence on vision for balance.  She was encouraged to add this to HEP at countertop today.  PT and Pt discussed a need for her to start strategizing a return to the gym for when PT ends.  For now Pt continues to need coordination cueing for higher level ther ex and intermittent cues for postural alignment with dynamic ther ex.  Continue along POC.    Stability/Clinical Decision Making Stable/Uncomplicated    Rehab Potential Excellent    PT Frequency 2x / week    PT Duration 8 weeks     PT Treatment/Interventions ADLs/Self Care Home Management;Electrical Stimulation;Cryotherapy;Moist Heat;Traction;Therapeutic exercise;Neuromuscular re-education;Patient/family education;Manual techniques;Passive range of motion;Dry needling;Functional mobility training;Spinal Manipulations    PT Next Visit Plan work on SLS eyes closed, continue strength, postural alignment within ther ex, increased core challenge with overlay of weighted UE/LE    PT Home Exercise Plan Access Code: [email or web address]    Consulted and Agree with Plan of Care Patient             Patient will benefit from skilled therapeutic intervention in order to improve the following deficits and impairments:     Visit Diagnosis: Chronic bilateral low back pain without sciatica  Muscle weakness (generalized)  Cramp and spasm     Problem List Patient Active Problem List   Diagnosis Date Noted   GAD (generalized anxiety disorder) 10/26/2020   MRSA nasal colonization 10/26/2020   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   Polymyalgia rheumatica ([place]) 10/23/2019   Spinal stenosis, lumbar region without neurogenic claudication 10/23/2019   Lumbar facet arthropathy 10/23/2019   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 [place], [place], [address] Phone: [phone]   Fax:  [phone]  Name: Cheryl Austin MRN:  [id number] Date of Birth: 02/13/1941

## 2021-05-10 ENCOUNTER — Ambulatory Visit: Payer: Medicare Other | Admitting: Physical Therapy

## 2021-05-10 ENCOUNTER — Encounter: Payer: Self-pay | Admitting: Physical Therapy

## 2021-05-10 ENCOUNTER — Other Ambulatory Visit: Payer: Self-pay

## 2021-05-10 DIAGNOSIS — M6281 Muscle weakness (generalized): Secondary | ICD-10-CM

## 2021-05-10 DIAGNOSIS — M545 Low back pain, unspecified: Secondary | ICD-10-CM | POA: Diagnosis not present

## 2021-05-10 DIAGNOSIS — G8929 Other chronic pain: Secondary | ICD-10-CM

## 2021-05-10 NOTE — Therapy (Signed)
[facility] [address] [place], [place], [address] Phone: [phone]   Fax:  [phone]  Physical Therapy Treatment  Patient Details  Name: Cheryl Austin MRN: [id number] Date of Birth: 1940-11-29 Referring Provider (PT): M54.50,G89.29 (ICD-10-CM) - Chronic low back pain, unspecified back pain laterality, unspecified whether sciatica present   Encounter Date: 05/10/2021   PT End of Session - 05/10/21 1010     Visit Number 21    Date for PT Re-Evaluation 05/29/21    Authorization Type U[employer] Medicare    Progress Note Due on Visit 29    PT Start Time 0934    PT Stop Time 1013    PT Time Calculation (min) 39 min    Activity Tolerance Patient tolerated treatment well    Behavior During Therapy [facility] for tasks assessed/performed             Past Medical History:  Diagnosis Date   Anxiety    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   By lumbar MRI 06/2019, mild   Thyroid disease    Hypothyroid    Past Surgical History:  Procedure Laterality Date   APPENDECTOMY  1961   BREAST SURGERY Bilateral [place]   BSO/Fibroids    There were no vitals filed for this visit.   Subjective Assessment - 05/10/21 0937     Subjective I may have overdone it a little yesterday with more soreness in the afternoon up to a 5/10.  This morning back to a 3/10.    Pertinent History PMH: polymyalgia rheumatica,  osteoporosis    Limitations Standing;Walking    How long can you stand comfortably? 1-2 hours (sitting improves pain)    How long can you walk comfortably? 1-2 hours    Diagnostic tests lumbar xray 2021L5/S1 mild spondylosis, moderate facet joint arthropathy    Patient Stated Goals be able to stand longer before needing to sit, get rid of pain, get back to gym and learn exercises    Currently in Pain? Yes    Pain Score 3     Pain Location Back    Pain Orientation Left;Right;Lower    Pain Descriptors / Indicators Aching;Tightness    Pain Type Chronic pain    Pain Onset More than a month ago    Pain Frequency Intermittent    Aggravating Factors  AM, overdoing it                               [facility] Adult PT Treatment/Exercise - 05/10/21 0001       Exercises   Exercises Shoulder;Lumbar;Knee/Hip      Lumbar Exercises: Aerobic   Recumbent Bike L2 x 5' PT present to discuss symptoms      Lumbar Exercises: Standing   Other Standing Lumbar Exercises wall plank forearms on red ball on wall move ball up/down and left/right x 10  each      Lumbar Exercises: Supine   Dead Bug 20 reps    Dead Bug Limitations legs in 90/90 gray balance pad under pelvis    Bridge Limitations feet on red ball ham curls from bridge 1x20 in bridge      Shoulder Exercises: Supine   Other Supine Exercises head/shoulders on red ball hold bridge, red tband horiz abd x 10, hold bridge x 15 sec extra after UE band      Shoulder Exercises: Power Development worker, community 20 reps    Extension Limitations 20lb standing stagger stance, bil UEs    Other Power Tower Exercises diagonal chops 1x20 Rt/Lt      Manual Therapy   Manual Therapy Soft tissue mobilization    Soft tissue mobilization bil lumbar paraspinals, QL and gluteals                 Balance Exercises - 05/10/21 0001       Balance Exercises: Standing   SLS Eyes closed;2 reps;Upper extremity support 1;20 secs                   PT Short Term Goals - 03/13/21 1024       PT SHORT TERM GOAL #1   Title Pt will be ind with initial HEP    Status Achieved      PT SHORT TERM GOAL #2   Title Pt will practice spinal decompression and diaphragmatic breathing at least 5 days/week in the afternoon to address spinal health and abdominal tension.    Status Achieved      PT SHORT TERM GOAL #3   Title Pt will demo reduced abdominal tension/improved resting position of the ribcage with ability to perform lateral costal expansion and abdominal breathing to normalize trunk posture and prep for proper core activation.    Status Partially Met               PT Long Term Goals - 05/08/21 0940       PT LONG TERM GOAL #1   Title Pt to be independent with final HEP with good understanding of how to transition to gym.    Baseline PT beginning to talk to Pt about how to incorporate ther ex to gym setting    Status On-going      PT LONG TERM GOAL #2   Title Pt will report ability to perform standing activities in house and yard for at least 2 hours with min or no exacerbation of pain.    Status Achieved      PT LONG TERM GOAL #3   Title Pt will demo optimal dynamic functional movement for squat, lift, carry using core and good body mechanics to reduce pain with functional tasks.    Baseline improving awareness of dynamic postural alignment, needs less cueing    Status On-going      PT LONG TERM GOAL #4   Title Pt will report at least 70% reduction in soreness with daily tasks.    Baseline 60%    Status On-going      PT LONG TERM GOAL #5   Title Pt will improve FOTO score from 52% to 59% to demo improved function.    Baseline 70%    Status Achieved                   Plan - 05/10/21 1011     Clinical Impression Statement Pt noted some increased afternoon sorness yesterday  reaching 5/10.  She had some increased tone and tenderness along posterior aspect of obliques and SL which released well with  STM.  She continues to need cues for standing chops for trunk rotation vs rigid trunk.  PT introduced static hold bridge on ball with head/shoulders on ball and UE ther ex as a progression of bridge series today.  Pt really liked wall plank with arms on ball moving ball side/side and up/down for core progression.  Pt will go to gym this Fri to trial return to gym to work towards more ind with HEP.    PT Frequency 2x / week    PT Duration 8 weeks    PT Treatment/Interventions ADLs/Self Care Home Management;Electrical Stimulation;Cryotherapy;Moist Heat;Traction;Therapeutic exercise;Neuromuscular re-education;Patient/family education;Manual techniques;Passive range of motion;Dry needling;Functional mobility training;Spinal Manipulations    PT Next Visit Plan work on SLS eyes closed, continue strength, postural alignment within ther ex, increased core challenge with overlay of weighted UE/LE, how did gym go, make list for gym    PT Home Exercise Plan Access Code: [email or web address]    Consulted and Agree with Plan of Care Patient             Patient will benefit from skilled therapeutic intervention in order to improve the following deficits and impairments:     Visit Diagnosis: Chronic bilateral low back pain without sciatica  Muscle weakness (generalized)     Problem List Patient Active Problem List   Diagnosis Date Noted   GAD (generalized anxiety disorder) 10/26/2020   MRSA nasal colonization 10/26/2020   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   Polymyalgia rheumatica ([place]) 10/23/2019   Spinal stenosis, lumbar region without neurogenic claudication 10/23/2019   Lumbar facet arthropathy 10/23/2019   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 [place], [place], [address] Phone: [phone]   Fax:  [phone]  Name: Cheryl Austin MRN: [id number] Date of Birth: [birth year]

## 2021-05-11 ENCOUNTER — Ambulatory Visit: Payer: Medicare Other | Admitting: Physician Assistant

## 2021-05-11 ENCOUNTER — Encounter: Payer: Self-pay | Admitting: Physician Assistant

## 2021-05-11 VITALS — BP 108/68 | HR 73 | Ht 63.0 in | Wt 119.2 lb

## 2021-05-11 DIAGNOSIS — M19042 Primary osteoarthritis, left hand: Secondary | ICD-10-CM

## 2021-05-11 DIAGNOSIS — Z8614 Personal history of Methicillin resistant Staphylococcus aureus infection: Secondary | ICD-10-CM

## 2021-05-11 DIAGNOSIS — M47816 Spondylosis without myelopathy or radiculopathy, lumbar region: Secondary | ICD-10-CM

## 2021-05-11 DIAGNOSIS — Z820 Family history of epilepsy and other diseases of the nervous system: Secondary | ICD-10-CM

## 2021-05-11 DIAGNOSIS — Z79899 Other long term (current) drug therapy: Secondary | ICD-10-CM | POA: Diagnosis not present

## 2021-05-11 DIAGNOSIS — M47812 Spondylosis without myelopathy or radiculopathy, cervical region: Secondary | ICD-10-CM | POA: Diagnosis not present

## 2021-05-11 DIAGNOSIS — Z8269 Family history of other diseases of the musculoskeletal system and connective tissue: Secondary | ICD-10-CM

## 2021-05-11 DIAGNOSIS — M353 Polymyalgia rheumatica: Secondary | ICD-10-CM | POA: Diagnosis not present

## 2021-05-11 DIAGNOSIS — Z8659 Personal history of other mental and behavioral disorders: Secondary | ICD-10-CM

## 2021-05-11 DIAGNOSIS — M19041 Primary osteoarthritis, right hand: Secondary | ICD-10-CM | POA: Diagnosis not present

## 2021-05-11 DIAGNOSIS — Z8639 Personal history of other endocrine, nutritional and metabolic disease: Secondary | ICD-10-CM

## 2021-05-11 DIAGNOSIS — M810 Age-related osteoporosis without current pathological fracture: Secondary | ICD-10-CM

## 2021-05-11 DIAGNOSIS — L219 Seborrheic dermatitis, unspecified: Secondary | ICD-10-CM

## 2021-05-11 DIAGNOSIS — E8809 Other disorders of plasma-protein metabolism, not elsewhere classified: Secondary | ICD-10-CM

## 2021-05-11 DIAGNOSIS — E559 Vitamin D deficiency, unspecified: Secondary | ICD-10-CM

## 2021-05-11 DIAGNOSIS — G4709 Other insomnia: Secondary | ICD-10-CM

## 2021-05-15 ENCOUNTER — Ambulatory Visit: Payer: Medicare Other | Admitting: Physical Therapy

## 2021-05-15 ENCOUNTER — Encounter: Payer: Self-pay | Admitting: Physical Therapy

## 2021-05-15 ENCOUNTER — Other Ambulatory Visit: Payer: Self-pay

## 2021-05-15 DIAGNOSIS — G8929 Other chronic pain: Secondary | ICD-10-CM

## 2021-05-15 DIAGNOSIS — M545 Low back pain, unspecified: Secondary | ICD-10-CM | POA: Diagnosis not present

## 2021-05-15 DIAGNOSIS — R252 Cramp and spasm: Secondary | ICD-10-CM

## 2021-05-15 DIAGNOSIS — M6281 Muscle weakness (generalized): Secondary | ICD-10-CM

## 2021-05-15 NOTE — Therapy (Signed)
[facility] [address] [place], [place], [address] Phone: [phone]   Fax:  [phone]  Physical Therapy Treatment  Patient Details  Name: Cheryl Austin MRN: [id number] Date of Birth: March 23, 1941 Referring Provider (PT): M54.50,G89.29 (ICD-10-CM) - Chronic low back pain, unspecified back pain laterality, unspecified whether sciatica present   Encounter Date: 05/15/2021   PT End of Session - 05/15/21 0936     Visit Number 22    Date for PT Re-Evaluation 05/29/21    Authorization Type UHC Medicare    Progress Note Due on Visit 55    PT Start Time 0933    PT Stop Time 1014    PT Time Calculation (min) 41 min    Activity Tolerance Patient tolerated treatment well    Behavior During Therapy [facility] for tasks assessed/performed             Past Medical History:  Diagnosis Date   Anxiety    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   By lumbar MRI 06/2019, mild   Thyroid disease    Hypothyroid    Past Surgical History:  Procedure Laterality Date   APPENDECTOMY  1961   BREAST SURGERY Bilateral [place]   BSO/Fibroids    There were no vitals filed for this visit.   Subjective Assessment - 05/15/21 0933     Subjective Pain did well over the weekend.  No higher than a 3/10.    Pertinent History PMH: polymyalgia rheumatica, osteoporosis    Limitations Standing;Walking    How long can you stand  comfortably? 1-2 hours (sitting improves pain)    How long can you walk comfortably? 1-2 hours    Diagnostic tests lumbar xray 2021L5/S1 mild spondylosis, moderate facet joint arthropathy    Patient Stated Goals be able to stand longer before needing to sit, get rid of pain, get back to gym and learn exercises    Currently in Pain? Yes    Pain Score 3     Pain Location Back    Pain Orientation Left;Right;Lower    Pain Descriptors / Indicators Aching;Tightness    Pain Type Chronic pain    Pain Onset More than a month ago    Pain Frequency Intermittent    Aggravating Factors  AM, overdoing it    Pain Relieving Factors stretch, exercise, activity pacing                               [facility] Adult PT Treatment/Exercise - 05/15/21 0001       Exercises   Exercises Shoulder;Lumbar;Knee/Hip      Lumbar Exercises: Stretches   Double Knee to Chest Stretch 60 seconds    Double Knee to Chest Stretch Limitations soft foam under pelvis    Figure 4 Stretch 1 rep;30 seconds      Lumbar Exercises: Aerobic   Recumbent Bike L3  x 5' PT present to discuss progress and status      Lumbar Exercises: Standing   Other Standing Lumbar Exercises deadlift 10lb x 10 reps, to knees      Lumbar Exercises: Seated   Long Arc Quad on [employer] Right;Left;20 reps    LAQ on [place] Limitations green ball    Hip Flexion on Ball Right;Left;20 reps    Hip Flexion on Ball Limitations green ball    Other Seated Lumbar Exercises on rounded side of BOSU on mat table holding 10lb reverse mini crunch with ribcage knitting x 20 reps      Knee/Hip Exercises: Standing   SLS with Vectors SLS on foam with diagonal 3lb dumbbell 1x10 each side, then 3-way contralateral taps x 5 rounds each      Knee/Hip Exercises: Seated   Sit to Sand 10 reps;2 sets;without UE support   holding 10lb in stagger stance Lt/Rt 1x10 each     Knee/Hip Exercises: Supine   Bridges Limitations head and shoulders on green ball static  hold bridge with UE chest press and fly 1x10 holding 8lb weights bil      Shoulder Exercises: Standing   Other Standing Exercises push ups hands on red ball on wall x 10    Other Standing Exercises plank forearms on ball against wall small motions up/down/side/side x 10 each      Shoulder Exercises: Power [employer] 20 reps    Row Limitations 25lb seated on green ball    Other Power Tower Exercises diagonal chops 1x20 Rt/Lt 25lb                       PT Short Term Goals - 03/13/21 1024       PT SHORT TERM GOAL #1   Title Pt will be ind with initial HEP    Status Achieved      PT SHORT TERM GOAL #2   Title Pt will practice spinal decompression and diaphragmatic breathing at least 5 days/week in the afternoon to address spinal health and abdominal tension.    Status Achieved      PT SHORT TERM GOAL #3   Title Pt will demo reduced abdominal tension/improved resting position of the ribcage with ability to perform lateral costal expansion and abdominal breathing to normalize trunk posture and prep for proper core activation.    Status Partially Met               PT Long Term Goals - 05/08/21 0940       PT LONG TERM GOAL #1   Title Pt to be independent with final HEP with good understanding of how to transition to gym.    Baseline PT beginning to talk to Pt about how to incorporate ther ex to gym setting    Status On-going      PT LONG TERM GOAL #2   Title Pt will report ability to perform standing activities in house and yard for at least 2 hours with min or no exacerbation of pain.    Status Achieved      PT LONG TERM GOAL #3   Title Pt will demo optimal dynamic functional movement for squat, lift, carry using core and good body mechanics to reduce pain with functional tasks.    Baseline improving awareness of dynamic postural alignment, needs less cueing    Status On-going      PT LONG TERM GOAL #4   Title Pt will  report at least 70% reduction in  soreness with daily tasks.    Baseline 60%    Status On-going      PT LONG TERM GOAL #5   Title Pt will improve FOTO score from 52% to 59% to demo improved function.    Baseline 70%    Status Achieved                   Plan - 05/15/21 1012     Clinical Impression Statement Pt with good pain control over weekend with pain not exceeding 3/10 since last visit.  PT continues to progress ther ex and build Pt's confidence to transition to gym work outs over time.  Pt has not tried gym yet and plans to bring her phone to take photos next time to use to guide her at gym.  Continue along POC.    PT Next Visit Plan take pics of ther ex for gym reminders (include static bridge on ball with 8lb UE chest press and fly)    PT Home Exercise Plan Access Code: [email or web address]    Consulted and Agree with Plan of Care Patient             Patient will benefit from skilled therapeutic intervention in order to improve the following deficits and impairments:     Visit Diagnosis: Chronic bilateral low back pain without sciatica  Muscle weakness (generalized)  Cramp and spasm     Problem List Patient Active Problem List   Diagnosis Date Noted   GAD (generalized anxiety disorder) 10/26/2020   MRSA nasal colonization 10/26/2020   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   Polymyalgia rheumatica ([place]) 10/23/2019   Spinal stenosis, lumbar region without neurogenic claudication 10/23/2019   Lumbar facet arthropathy 10/23/2019   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, [place], [place], [address] Phone: [phone]   Fax:  [phone]  Name: Cheryl Austin MRN: [id number] Date of Birth: 1940/09/16

## 2021-05-17 ENCOUNTER — Encounter: Payer: Self-pay | Admitting: Physical Therapy

## 2021-05-17 ENCOUNTER — Other Ambulatory Visit: Payer: Self-pay

## 2021-05-17 ENCOUNTER — Ambulatory Visit: Payer: Medicare Other | Admitting: Physical Therapy

## 2021-05-17 DIAGNOSIS — M545 Low back pain, unspecified: Secondary | ICD-10-CM | POA: Diagnosis not present

## 2021-05-17 DIAGNOSIS — M6281 Muscle weakness (generalized): Secondary | ICD-10-CM

## 2021-05-17 DIAGNOSIS — G8929 Other chronic pain: Secondary | ICD-10-CM

## 2021-05-17 DIAGNOSIS — R252 Cramp and spasm: Secondary | ICD-10-CM

## 2021-05-17 LAB — PROTEIN ELECTROPHORESIS, SERUM, WITH REFLEX
Albumin ELP: 4.2 g/dL (ref 3.8–4.8)
Alpha 1: 0.3 g/dL (ref 0.2–0.3)
Alpha 2: 0.6 g/dL (ref 0.5–0.9)
Beta 2: 0.2 g/dL (ref 0.2–0.5)
Beta Globulin: 0.4 g/dL (ref 0.4–0.6)
Gamma Globulin: 0.6 g/dL — ABNORMAL LOW (ref 0.8–1.7)
Total Protein: 6.3 g/dL (ref 6.1–8.1)

## 2021-05-17 LAB — COMPLETE METABOLIC PANEL WITH GFR
AG Ratio: 2.1 (calc) (ref 1.0–2.5)
ALT: 17 U/L (ref 6–29)
AST: 23 U/L (ref 10–35)
Albumin: 4.2 g/dL (ref 3.6–5.1)
Alkaline phosphatase (APISO): 66 U/L (ref 37–153)
BUN: 17 mg/dL (ref 7–25)
CO2: 26 mmol/L (ref 20–32)
Calcium: 9.3 mg/dL (ref 8.6–10.4)
Chloride: 103 mmol/L (ref 98–110)
Creat: 0.69 mg/dL (ref 0.60–1.00)
Globulin: 2 g/dL (calc) (ref 1.9–3.7)
Glucose, Bld: 99 mg/dL (ref 65–99)
Potassium: 4.4 mmol/L (ref 3.5–5.3)
Sodium: 136 mmol/L (ref 135–146)
Total Bilirubin: 0.5 mg/dL (ref 0.2–1.2)
Total Protein: 6.2 g/dL (ref 6.1–8.1)
eGFR: 89 mL/min/{1.73_m2} (ref >=60)

## 2021-05-17 LAB — CBC WITH DIFFERENTIAL/PLATELET
Absolute Monocytes: 500 cells/uL (ref 200–950)
Basophils Absolute: 43 cells/uL (ref 0–200)
Basophils Relative: 0.7 %
Eosinophils Absolute: 128 cells/uL (ref 15–500)
Eosinophils Relative: 2.1 %
HCT: 39.8 % (ref 35.0–45.0)
Hemoglobin: 13.4 g/dL (ref 11.7–15.5)
Lymphs Abs: 1421 cells/uL (ref 850–3900)
MCH: 29.7 pg (ref 27.0–33.0)
MCHC: 33.7 g/dL (ref 32.0–36.0)
MCV: 88.2 fL (ref 80.0–100.0)
MPV: 9.7 fL (ref 7.5–12.5)
Monocytes Relative: 8.2 %
Neutro Abs: 4008 cells/uL (ref 1500–7800)
Neutrophils Relative %: 65.7 %
Platelets: 241 10*3/uL (ref 140–400)
RBC: 4.51 10*6/uL (ref 3.80–5.10)
RDW: 12.4 % (ref 11.0–15.0)
Total Lymphocyte: 23.3 %
WBC: 6.1 10*3/uL (ref 3.8–10.8)

## 2021-05-17 LAB — IGG, IGA, IGM
IgG (Immunoglobin G), Serum: 692 mg/dL (ref 600–1540)
IgM, Serum: 61 mg/dL (ref 50–300)
Immunoglobulin A: 122 mg/dL (ref 70–320)

## 2021-05-17 LAB — IFE INTERPRETATION: Immunofix Electr Int: NOT DETECTED

## 2021-05-17 LAB — VITAMIN D 25 HYDROXY (VIT D DEFICIENCY, FRACTURES): Vit D, 25-Hydroxy: 47 ng/mL (ref 30–100)

## 2021-05-17 NOTE — Progress Notes (Signed)
CBC and CMP WNL.  Vitamin D is within the desirable range.  Please recommend a maintenance dose of vitamin D.   Immunoglobulins WNL.  IFE did not reveal any monoclonal proteins.

## 2021-05-17 NOTE — Therapy (Signed)
[facility] [address] [place], [place], [address] Phone: [phone]   Fax:  [phone]  Physical Therapy Treatment  Patient Details  Name: Cheryl Austin MRN: [id number] Date of Birth: Jul 12, 1941 Referring Provider (PT): M54.50,G89.29 (ICD-10-CM) - Chronic low back pain, unspecified back pain laterality, unspecified whether sciatica present   Encounter Date: 05/17/2021   PT End of Session - 05/17/21 0941     Visit Number 23    Date for PT Re-Evaluation 05/29/21    Authorization Type UHC Medicare    Progress Note Due on Visit 30    PT Start Time 0930    PT Stop Time 1011    PT Time Calculation (min) 41 min    Activity Tolerance Patient tolerated treatment well    Behavior During Therapy [facility] for tasks assessed/performed             Past Medical History:  Diagnosis Date   Anxiety    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   By lumbar MRI 06/2019, mild   Thyroid disease    Hypothyroid    Past Surgical History:  Procedure Laterality Date   APPENDECTOMY  1961   BREAST SURGERY Bilateral [place]   BSO/Fibroids    There were no vitals filed for this visit.   Subjective Assessment - 05/17/21 0933     Subjective I can work 5-6 hours before pain.  I can sit or take a break and it helps.    Pertinent History PMH: polymyalgia rheumatica, osteoporosis    Limitations Standing;Walking    How  long can you stand comfortably? 1-2 hours (sitting improves pain)    How long can you walk comfortably? 1-2 hours    Diagnostic tests lumbar xray 2021L5/S1 mild spondylosis, moderate facet joint arthropathy    Patient Stated Goals be able to stand longer before needing to sit, get rid of pain, get back to gym and learn exercises    Currently in Pain? Yes    Pain Score 2     Pain Location Back    Pain Orientation Lower;Mid    Pain Descriptors / Indicators Aching    Pain Type Chronic pain                               [facility] Adult PT Treatment/Exercise - 05/17/21 0001       Exercises   Exercises Shoulder;Lumbar;Knee/Hip      Lumbar Exercises: Aerobic   Recumbent Bike L3 x 5' PT present to discuss progress and status      Lumbar Exercises: Supine   Dead Bug Limitations 6lb bil dumbell overhead while bil heels on red ball rollouts x 20    Bridge 20 reps    Bridge Limitations feet on ball with hamstring curls x 20      Lumbar Exercises: Sidelying   Clam Both;20 reps  Clam Limitations red loop    Other Sidelying Lumbar Exercises plank x 20" bil SL      Lumbar Exercises: Quadruped   Straight Leg Raise 15 reps    Straight Leg Raises Limitations quadruped on elbows hip ext    Plank 1x20"      Shoulder Exercises: Supine   Other Supine Exercises 8lb chest press and fly in static hold bridge head/shoulders on ball x 10 each      Shoulder Exercises: Standing   Other Standing Exercises push ups hands on red ball on wall x 10    Other Standing Exercises plank forearms on ball against wall small motions up/down/side/side x 10 each      Shoulder Exercises: Power [employer] 20 reps    Row Limitations 25lb seated on green ball    Other Power Tower Exercises diagonal chops 1x20 Rt/Lt 25lb                       PT Short Term Goals - 03/13/21 1024       PT SHORT TERM GOAL #1   Title Pt will be ind with initial HEP    Status Achieved      PT SHORT  TERM GOAL #2   Title Pt will practice spinal decompression and diaphragmatic breathing at least 5 days/week in the afternoon to address spinal health and abdominal tension.    Status Achieved      PT SHORT TERM GOAL #3   Title Pt will demo reduced abdominal tension/improved resting position of the ribcage with ability to perform lateral costal expansion and abdominal breathing to normalize trunk posture and prep for proper core activation.    Status Partially Met               PT Long Term Goals - 05/08/21 0940       PT LONG TERM GOAL #1   Title Pt to be independent with final HEP with good understanding of how to transition to gym.    Baseline PT beginning to talk to Pt about how to incorporate ther ex to gym setting    Status On-going      PT LONG TERM GOAL #2   Title Pt will report ability to perform standing activities in house and yard for at least 2 hours with min or no exacerbation of pain.    Status Achieved      PT LONG TERM GOAL #3   Title Pt will demo optimal dynamic functional movement for squat, lift, carry using core and good body mechanics to reduce pain with functional tasks.    Baseline improving awareness of dynamic postural alignment, needs less cueing    Status On-going      PT LONG TERM GOAL #4   Title Pt will report at least 70% reduction in soreness with daily tasks.    Baseline 60%    Status On-going      PT LONG TERM GOAL #5   Title Pt will improve FOTO score from 52% to 59% to demo improved function.    Baseline 70%    Status Achieved                   Plan - 05/17/21 0942     Clinical Impression Statement Session spent reviewing ther ex that can transfer to gym.  Pt has not been back to gym since pre-covid.  Pt requested taking videos and pics of ther ex  for memory reminders.  Pt scheduled for re-eval next time and PT discussed a planned taper to encourage Pt to trial return to gym.  Pt needing ongoing minor form cueing to maximize  effectiveness of ther ex and postural set up.  She continues to have increased tolerance of activity up to 5-6 hours now before pain, and is able to reduce pain with activity breaks in sitting or lying down.  Re-eval next session.    PT Frequency 2x / week    PT Duration 8 weeks    PT Treatment/Interventions ADLs/Self Care Home Management;Electrical Stimulation;Cryotherapy;Moist Heat;Traction;Therapeutic exercise;Neuromuscular re-education;Patient/family education;Manual techniques;Passive range of motion;Dry needling;Functional mobility training;Spinal Manipulations    PT Next Visit Plan f/u on written list of ther ex next time, ERO, taper to 1x/week    PT Home Exercise Plan Access Code: [email or web address]    Consulted and Agree with Plan of Care Patient             Patient will benefit from skilled therapeutic intervention in order to improve the following deficits and impairments:     Visit Diagnosis: Chronic bilateral low back pain without sciatica  Muscle weakness (generalized)  Cramp and spasm     Problem List Patient Active Problem List   Diagnosis Date Noted   GAD (generalized anxiety disorder) 10/26/2020   MRSA nasal colonization 10/26/2020   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   Polymyalgia rheumatica ([place]) 10/23/2019   Spinal stenosis, lumbar region without neurogenic claudication 10/23/2019   Lumbar facet arthropathy 10/23/2019   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, [place], [place], [address] Phone: [phone]   Fax:  [phone]  Name: Masa Lubin MRN: [id number] Date of Birth: [birth year]

## 2021-05-24 ENCOUNTER — Other Ambulatory Visit: Payer: Self-pay

## 2021-05-24 ENCOUNTER — Encounter: Payer: Self-pay | Admitting: Physical Therapy

## 2021-05-24 ENCOUNTER — Ambulatory Visit: Payer: Medicare Other | Attending: Family Medicine | Admitting: Physical Therapy

## 2021-05-24 DIAGNOSIS — R252 Cramp and spasm: Secondary | ICD-10-CM | POA: Diagnosis present

## 2021-05-24 DIAGNOSIS — G8929 Other chronic pain: Secondary | ICD-10-CM | POA: Insufficient documentation

## 2021-05-24 DIAGNOSIS — M6281 Muscle weakness (generalized): Secondary | ICD-10-CM | POA: Insufficient documentation

## 2021-05-24 DIAGNOSIS — M545 Low back pain, unspecified: Secondary | ICD-10-CM | POA: Diagnosis not present
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
## 2021-05-26 ENCOUNTER — Encounter: Payer: Medicare Other | Admitting: Physical Therapy

## 2021-05-29 ENCOUNTER — Encounter: Payer: Medicare Other | Admitting: Physical Therapy

## 2021-05-30 ENCOUNTER — Encounter: Payer: Medicare Other | Admitting: Physical Therapy

## 2021-06-01 ENCOUNTER — Other Ambulatory Visit: Payer: Self-pay

## 2021-06-01 ENCOUNTER — Encounter: Payer: Self-pay | Admitting: Physical Therapy

## 2021-06-01 ENCOUNTER — Ambulatory Visit: Payer: Medicare Other | Admitting: Physical Therapy

## 2021-06-01 DIAGNOSIS — G8929 Other chronic pain: Secondary | ICD-10-CM

## 2021-06-01 DIAGNOSIS — M545 Low back pain, unspecified: Secondary | ICD-10-CM | POA: Diagnosis not present

## 2021-06-01 DIAGNOSIS — R252 Cramp and spasm: Secondary | ICD-10-CM

## 2021-06-01 DIAGNOSIS — M6281 Muscle weakness (generalized): Secondary | ICD-10-CM
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
## 2021-06-02 ENCOUNTER — Encounter: Payer: Medicare Other | Admitting: Physical Therapy

## 2021-06-05 ENCOUNTER — Encounter: Payer: Medicare Other | Admitting: Physical Therapy

## 2021-06-06 ENCOUNTER — Encounter: Payer: Medicare Other | Admitting: Physical Therapy

## 2021-06-08 ENCOUNTER — Encounter: Payer: Self-pay | Admitting: Physical Therapy

## 2021-06-08 ENCOUNTER — Other Ambulatory Visit: Payer: Self-pay

## 2021-06-08 ENCOUNTER — Ambulatory Visit: Payer: Medicare Other | Admitting: Family Medicine

## 2021-06-08 ENCOUNTER — Encounter: Payer: Self-pay | Admitting: Family Medicine

## 2021-06-08 ENCOUNTER — Ambulatory Visit: Payer: Medicare Other | Admitting: Physical Therapy

## 2021-06-08 VITALS — BP 138/90 | HR 76 | Temp 98.2°F | Ht 64.0 in | Wt 116.6 lb

## 2021-06-08 DIAGNOSIS — Z23 Encounter for immunization: Secondary | ICD-10-CM

## 2021-06-08 DIAGNOSIS — M545 Low back pain, unspecified: Secondary | ICD-10-CM

## 2021-06-08 DIAGNOSIS — M48061 Spinal stenosis, lumbar region without neurogenic claudication: Secondary | ICD-10-CM

## 2021-06-08 DIAGNOSIS — R252 Cramp and spasm: Secondary | ICD-10-CM

## 2021-06-08 DIAGNOSIS — G8929 Other chronic pain: Secondary | ICD-10-CM

## 2021-06-08 DIAGNOSIS — M6281 Muscle weakness (generalized): Secondary | ICD-10-CM

## 2021-06-08 DIAGNOSIS — M47816 Spondylosis without myelopathy or radiculopathy, lumbar region: Secondary | ICD-10-CM | POA: Diagnosis not present
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
## 2021-06-08 NOTE — Progress Notes (Signed)
Subjective  CC:  Chief Complaint  Patient presents with   Back Pain    Ongoing for 3 years, flared up last week    HPI: Cheryl Austin is a 80 y.o. female who presents to the office today to address the problems listed above in the chief complaint. 80 year old female with long history of lumbar facet arthropathy/osteoarthritis/lumbar stenosis good been doing very well with physical therapy over the last several months complains of mild mid low back pain for the last week or so.  Describes more symptoms after prolonged standing or working.  Pain is described as an ache.  There is no radicular symptoms.  No loss of bowel or bladder function.  No weakness.  She uses a Tylenol or meloxicam 7.5 mg intermittently and that did help her pain.  She does not like to take medications.  She does have a history of polymyalgia rheumatica but denies significant shoulder girdle or pelvic girdle pain.  She admits to some fatigue.  No recent triggers.  Assessment  1. Lumbar facet arthropathy   2. Spinal stenosis, lumbar region without neurogenic claudication      Plan  Low back pain: Musculoskeletal in nature, related to osteoarthritis flare.  Recommend meloxicam 15 mg daily for the next 10 to 14 days.  May use Tylenol in the afternoon if needed.  Continue physical therapy stretches and exercises.  She has follow-up with me in 2 weeks.  If symptoms worsen or persist further work-up initiated.  Monitor for recurrence of PMR.  Follow up: As scheduled 06/19/2021  No orders of the defined types were placed in this encounter.  No orders of the defined types were placed in this encounter.     I reviewed the patients updated PMH, FH, and SocHx.    Patient Active Problem List   Diagnosis Date Noted   Polymyalgia rheumatica (H[facility]) 10/23/2019    Priority: 1.   Moderate episode of recurrent major depressive disorder ([facility]) 06/22/2013    Priority: 1.   MRSA cellulitis 01/30/2013    Priority: 1.    Hypothyroidism 12/18/2010    Priority: 1.   Spinal stenosis, lumbar region without neurogenic claudication 10/23/2019    Priority: 2.   Spondylosis of cervical region without myelopathy or radiculopathy 05/29/2018    Priority: 2.   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    Priority: 2.   Dermatitis, atopic 01/30/2013    Priority: 2.   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   Insomnia 03/18/2012    Priority: 2.   Family history of Parkinson disease 05/21/2016    Priority: 3.   Seborrheic dermatitis 11/02/2014    Priority: 3.   Menopausal hot flushes 01/30/2013    Priority: 3.   GAD (generalized anxiety disorder) 10/26/2020    Priority: 1.   Lumbar facet arthropathy 10/23/2019    Priority: 2.   MRSA nasal colonization 10/26/2020    Priority: 3.   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    Priority: 3.   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   Current Meds  Medication Sig   alendronate (FOSAMAX) 70 MG tablet TAKE 1 TABLET (70 MG TOTAL) BY MOUTH ONCE A WEEK. TAKE WITH A FULL GLASS OF WATER ON AN EMPTY STOMACH.   betamethasone dipropionate 0.05 % cream APPLY TO AFFECTED AREA TWICE A DAY (Patient taking differently: as needed. APPLY TO AFFECTED AREA TWICE A DAY)   levothyroxine (SYNTHROID) 50 MCG tablet TAKE 1 TABLET BY MOUTH EVERY DAY  LORazepam (ATIVAN) 0.5 MG tablet Take 1-2 tablets by mouth daily as needed.   meloxicam (MOBIC) 15 MG tablet Take 0.5-1 tablets by mouth daily as needed.   meloxicam (MOBIC) 7.5 MG tablet Take 7.5 mg by mouth daily.   mupirocin ointment (BACTROBAN) 2 % Apply to affected area 1-2 times daily   sertraline (ZOLOFT) 100 MG tablet Take 0.5 tablets (50 mg total) by mouth daily.   traZODone (DESYREL) 100 MG tablet Take 100 mg by mouth as needed for sleep.    Allergies: Patient has No Known Allergies. Family History: Patient family history includes Alcohol abuse in her daughter; Asthma in her mother; Depression in her daughter and  mother; Heart disease in her father; Hyperlipidemia in her father; Lupus in her mother; Parkinson's disease in her mother; Stroke in her father. Social History:  Patient  reports that she has never smoked. She has never used smokeless tobacco. She reports current alcohol use. She reports that she does not use drugs.  Review of Systems: Constitutional: Negative for fever malaise or anorexia Cardiovascular: negative for chest pain Respiratory: negative for SOB or persistent cough Gastrointestinal: negative for abdominal pain  Objective  Vitals: BP 138/90   Pulse 76   Temp 98.2 F (36.8 C) (Temporal)   Ht 5\' 4"  (1.626 m)   Wt 116 lb 9.6 oz (52.9 kg)   SpO2 96%   BMI 20.01 kg/m  General: no acute distress , A&Ox3 Back: Full range of motion.  Minimal left paravertebral muscle tenderness.  No SI joint tenderness.  No sciatic notch tenderness.  Negative straight leg raise but early.  Normal gait.   Commons side effects, risks, benefits, and alternatives for medications and treatment plan prescribed today were discussed, and the patient expressed understanding of the given instructions. Patient is instructed to call or message via MyChart if he/she has any questions or concerns regarding our treatment plan. No barriers to understanding were identified. We discussed Red Flag symptoms and signs in detail. Patient expressed understanding regarding what to do in case of urgent or emergency type symptoms.  Medication list was reconciled, printed and provided to the patient in AVS. Patient instructions and summary information was reviewed with the patient as documented in the AVS. This note was prepared with assistance of Dragon voice recognition software. Occasional wrong-word or sound-a-like substitutions may have occurred due to the inherent limitations of voice recognition software  This visit occurred during the SARS-CoV-2 public health emergency.  Safety protocols were in place, including  screening questions prior to the visit, additional usage of staff PPE, and extensive cleaning of exam room while observing appropriate contact time as indicated for disinfecting solutions.

## 2021-06-08 NOTE — Patient Instructions (Signed)
Please follow up as scheduled for your next visit with me: 06/19/2021   Take meloxicam 15mg  daily for the next 10-14 days.  Continue physical therapy.  If you have any questions or concerns, please don't hesitate to send me a message via MyChart or call the office at [phone]. Thank you for visiting with [phone] today! It's our pleasure caring for you.

## 2021-06-09 ENCOUNTER — Encounter: Payer: Medicare Other | Admitting: Physical Therapy

## 2021-06-12 ENCOUNTER — Encounter: Payer: Medicare Other | Admitting: Physical Therapy

## 2021-06-13 ENCOUNTER — Encounter: Payer: Medicare Other | Admitting: Physical Therapy

## 2021-06-15 ENCOUNTER — Encounter: Payer: Medicare Other | Admitting: Physical Therapy

## 2021-06-16 ENCOUNTER — Encounter: Payer: Medicare Other | Admitting: Physical Therapy

## 2021-06-16 ENCOUNTER — Encounter: Payer: Medicare Other | Admitting: Family Medicine

## 2021-06-19 ENCOUNTER — Encounter: Payer: Medicare Other | Admitting: Family Medicine

## 2021-06-20 ENCOUNTER — Encounter: Payer: Medicare Other | Admitting: Physical Therapy

## 2021-06-22 ENCOUNTER — Encounter: Payer: Self-pay | Admitting: Physical Therapy

## 2021-06-22 ENCOUNTER — Other Ambulatory Visit: Payer: Self-pay

## 2021-06-22 ENCOUNTER — Ambulatory Visit: Payer: Medicare Other | Attending: Family Medicine | Admitting: Physical Therapy

## 2021-06-22 DIAGNOSIS — G8929 Other chronic pain: Secondary | ICD-10-CM | POA: Diagnosis present

## 2021-06-22 DIAGNOSIS — R252 Cramp and spasm: Secondary | ICD-10-CM | POA: Diagnosis present

## 2021-06-22 DIAGNOSIS — M6281 Muscle weakness (generalized): Secondary | ICD-10-CM | POA: Insufficient documentation

## 2021-06-22 DIAGNOSIS — M545 Low back pain, unspecified: Secondary | ICD-10-CM | POA: Insufficient documentation

## 2021-06-22 NOTE — Therapy (Signed)
[facility] @ [address] [place], [place], [address] Phone: [phone]   Fax:  [phone]  Physical Therapy Treatment  Patient Details  Name: Cheryl Austin MRN: [id number] Date of Birth: [birth year] Referring Provider (PT): M54.50,G89.29 (ICD-10-CM) - Chronic low back pain, unspecified back pain laterality, unspecified whether sciatica present   Encounter Date: 06/22/2021   PT End of Session - 06/22/21 0936     Visit Number 27    Date for PT Re-Evaluation 07/19/21    Authorization Type [employer] Medicare    Progress Note Due on Visit 30    PT Start Time 0930    PT Stop Time 1012    PT Time Calculation (min) 42 min    Activity Tolerance Patient tolerated treatment well    Behavior During Therapy [facility] for tasks assessed/performed             Past Medical History:  Diagnosis Date   Anxiety    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   By lumbar MRI 06/2019, mild   Thyroid disease    Hypothyroid    Past Surgical History:  Procedure Laterality Date   APPENDECTOMY  1961   BREAST SURGERY Bilateral [place]   BSO/Fibroids    There were no vitals filed for this visit.   Subjective Assessment - 06/22/21 0929     Subjective I drove to [facility] and back last week and that took away any progress I had made.  I am needing pain pills sometimes. The last session helped until I had to sit and drive and then I was pretty  inactive in [place].    Pertinent History PMH: polymyalgia rheumatica, osteoporosis    Limitations Standing;Walking    How long can you stand comfortably? 1-2 hours (sitting improves pain)    How long can you walk comfortably? 1-2 hours    Diagnostic tests lumbar xray 2021L5/S1 mild spondylosis, moderate facet joint arthropathy    Patient Stated Goals be able to stand longer before needing to sit, get rid of pain, get back to gym and learn exercises    Currently in Pain? Yes    Pain Score 7     Pain Location Back    Pain Orientation Right;Left;Lower    Pain Descriptors / Indicators Aching;Tightness;Dull    Pain Type Chronic pain    Pain Onset More than a month ago    Pain Frequency Intermittent    Aggravating Factors  being too still but also doing too much    Pain Relieving Factors heat, stretch, pacing                               [facility] Adult PT Treatment/Exercise - 06/22/21 0001       Exercises   Exercises Lumbar;Knee/Hip;Shoulder      Lumbar Exercises: Stretches   Active Hamstring Stretch Left;Right;1 rep;20 seconds  Active Hamstring Stretch Limitations supine    Lower Trunk Rotation 1 rep;20 seconds    Lower Trunk Rotation Limitations Lt/Rt    Figure 4 Stretch 1 rep;20 seconds;Supine;With overpressure    Other Lumbar Stretch Exercise windshield wipers x 10    Other Lumbar Stretch Exercise 3-way seated ball rollouts 2x10 sec each      Lumbar Exercises: Aerobic   Recumbent Bike L2 x 6' with lumbar heat and PT present to discuss status      Manual Therapy   Manual Therapy Joint mobilization;Soft tissue mobilization;Manual Traction    Joint Mobilization Lt L5/S1 UPA and bil PAs to facets Gr I-III    Soft tissue mobilization bil lumbar MF, Lt thoracic paraspinal stripping, Lt QL and obliques    Myofascial Release thoracodorsal fascia release bil    Manual Traction Gr II/III prone                       PT Short Term Goals - 03/13/21 1024        PT SHORT TERM GOAL #1   Title Pt will be ind with initial HEP    Status Achieved      PT SHORT TERM GOAL #2   Title Pt will practice spinal decompression and diaphragmatic breathing at least 5 days/week in the afternoon to address spinal health and abdominal tension.    Status Achieved      PT SHORT TERM GOAL #3   Title Pt will demo reduced abdominal tension/improved resting position of the ribcage with ability to perform lateral costal expansion and abdominal breathing to normalize trunk posture and prep for proper core activation.    Status Partially Met               PT Long Term Goals - 05/08/21 0940       PT LONG TERM GOAL #1   Title Pt to be independent with final HEP with good understanding of how to transition to gym.    Baseline PT beginning to talk to Pt about how to incorporate ther ex to gym setting    Status On-going      PT LONG TERM GOAL #2   Title Pt will report ability to perform standing activities in house and yard for at least 2 hours with min or no exacerbation of pain.    Status Achieved      PT LONG TERM GOAL #3   Title Pt will demo optimal dynamic functional movement for squat, lift, carry using core and good body mechanics to reduce pain with functional tasks.    Baseline improving awareness of dynamic postural alignment, needs less cueing    Status On-going      PT LONG TERM GOAL #4   Title Pt will report at least 70% reduction in soreness with daily tasks.    Baseline 60%    Status On-going      PT LONG TERM GOAL #5   Title Pt will improve FOTO score from 52% to 59% to demo improved function.    Baseline 70%    Status Achieved                   Plan - 06/22/21 1013     Clinical Impression Statement Pt continues to have heightened pain since yard work approx 1 month ago which was further exacerbated last week driving to [facility].  Pt intends to contact MD to schedule a visit.  Pain appears to be  stemming from lumbar facets L5/S1  Lt> Rt where Pt has diagnosed arthopathy as well as Lt > Rt spasm of paraspinals and QL.  Pt reports 1-2 days of relief with PT with use of heat, stretching and manual techniques to promote joint and soft tissue mobility and decompression via manual traction.  Continue along POC with ongoing assessment of response to interventions.  Pt did report pain reduced by at least half to 3-/410 end of session today.    Personal Factors and Comorbidities Time since onset of injury/illness/exacerbation    Examination-Activity Limitations Locomotion Level;Stand;Lift;Squat;Carry    Examination-Participation Restrictions Community Activity;Cleaning;Laundry;Yard Work;Meal Prep    Rehab Potential Excellent    PT Frequency 1x / week    PT Duration 8 weeks    PT Treatment/Interventions ADLs/Self Care Home Management;Electrical Stimulation;Cryotherapy;Moist Heat;Traction;Therapeutic exercise;Neuromuscular re-education;Patient/family education;Manual techniques;Passive range of motion;Dry needling;Functional mobility training;Spinal Manipulations    PT Next Visit Plan continue joint mobs, traction, STM to address pain flare up, heat/try estim, continue gentle stretching and light core return    PT Home Exercise Plan Access Code: [email or web address]             Patient will benefit from skilled therapeutic intervention in order to improve the following deficits and impairments:  Postural dysfunction, Decreased mobility, Hypomobility, Improper body mechanics, Pain, Increased muscle spasms  Visit Diagnosis: Chronic bilateral low back pain without sciatica  Cramp and spasm  Muscle weakness (generalized)     Problem List Patient Active Problem List   Diagnosis Date Noted   GAD (generalized anxiety disorder) 10/26/2020   MRSA nasal colonization 10/26/2020   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   Polymyalgia rheumatica ([place]) 10/23/2019   Spinal stenosis, lumbar region without neurogenic claudication 10/23/2019    Lumbar facet arthropathy 10/23/2019   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   Spondylosis of cervical region without myelopathy or radiculopathy 05/29/2018   Family history of Parkinson disease 05/21/2016   Seborrheic dermatitis 11/02/2014   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   Moderate episode of recurrent major depressive disorder ([place]) 06/22/2013   Dermatitis, atopic 01/30/2013   Menopausal hot flushes 01/30/2013   MRSA cellulitis 01/30/2013   Osteoarthritis, 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   Insomnia 03/18/2012   Hypothyroidism 12/18/2010   Baruch Merl, PT 06/22/21 11:09 AM   [facility] @ [address] [place], [place], [address] Phone: [phone]   Fax:  [phone]  Name: Aleesha Ringstad MRN: [id number] Date of Birth: 1941-04-28

## 2021-06-29 ENCOUNTER — Encounter: Payer: Self-pay | Admitting: Physical Therapy

## 2021-06-29 ENCOUNTER — Ambulatory Visit: Payer: Medicare Other | Admitting: Physical Therapy

## 2021-06-29 ENCOUNTER — Other Ambulatory Visit: Payer: Self-pay

## 2021-06-29 DIAGNOSIS — G8929 Other chronic pain: Secondary | ICD-10-CM

## 2021-06-29 DIAGNOSIS — M6281 Muscle weakness (generalized): Secondary | ICD-10-CM

## 2021-06-29 DIAGNOSIS — R252 Cramp and spasm: Secondary | ICD-10-CM

## 2021-06-29 DIAGNOSIS — M545 Low back pain, unspecified: Secondary | ICD-10-CM | POA: Diagnosis not present

## 2021-06-29 NOTE — Therapy (Signed)
[facility] @ [address] [place], [place], [address] Phone: [phone]   Fax:  [phone]  Physical Therapy Treatment  Patient Details  Name: Cheryl Austin MRN: [id number] Date of Birth: [birth year] Referring Provider (PT): M54.50,G89.29 (ICD-10-CM) - Chronic low back pain, unspecified back pain laterality, unspecified whether sciatica present   Encounter Date: 06/29/2021   PT End of Session - 06/29/21 0933     Visit Number 28    Date for PT Re-Evaluation 07/19/21    Authorization Type UHC Medicare    Progress Note Due on Visit 30    PT Start Time 0930    PT Stop Time 1011    PT Time Calculation (min) 41 min    Activity Tolerance Patient tolerated treatment well    Behavior During Therapy [facility] for tasks assessed/performed             Past Medical History:  Diagnosis Date   Anxiety    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   By lumbar MRI 06/2019, mild   Thyroid disease    Hypothyroid    Past Surgical History:  Procedure Laterality Date   APPENDECTOMY  1961   BREAST SURGERY Bilateral [place]   BSO/Fibroids    There were no vitals filed for this visit.   Subjective Assessment - 06/29/21 0931     Subjective I am doing so much better and feel like I'm rounding a corner.  Maybe 40% better but not confident it is going to last.    Pertinent History PMH: polymyalgia rheumatica, osteoporosis     Limitations Standing;Walking    How long can you stand comfortably? 1-2 hours (sitting improves pain)    How long can you walk comfortably? 1-2 hours    Diagnostic tests lumbar xray 2021L5/S1 mild spondylosis, moderate facet joint arthropathy    Patient Stated Goals be able to stand longer before needing to sit, get rid of pain, get back to gym and learn exercises    Currently in Pain? Yes    Pain Score 4     Pain Location Back    Pain Orientation Right;Left;Mid;Lower    Pain Descriptors / Indicators Aching;Tightness;Dull    Pain Type Chronic pain                               [facility] Adult PT Treatment/Exercise - 06/29/21 0001       Exercises   Exercises Shoulder;Lumbar;Knee/Hip      Lumbar Exercises: Stretches   Figure 4 Stretch 1 rep;20 seconds;Supine;With overpressure    Figure 4 Stretch Limitations soft foam under pelvis    Other Lumbar Stretch Exercise open book leg propped on foam roller, with ribcage expansion inhale/exhale each rep    Other Lumbar Stretch Exercise 3-way seated ball rollouts 2x10 sec each      Lumbar Exercises: Aerobic   Nustep  L5 x 5' with lumbar heat, PT present to discuss pain/progres      Lumbar Exercises: Supine   Bridge 5 reps;Compliant      Lumbar Exercises: Quadruped   [place] 5 reps    Straight Leg Raise 5 reps    Straight Leg Raises Limitations quad on elbows    Opposite Arm/Leg Raise 10 reps;Left arm/Right leg;Right arm/Left leg      Knee/Hip Exercises: Stretches   Hip Flexor Stretch Both;2 reps;10 seconds    Hip Flexor Stretch Limitations doorframe reach up/over      Knee/Hip Exercises: Standing   Forward Step Up Both;1 set;10 reps;Step Height: 6";Hand Hold: 0    Forward Step Up Limitations march tap 4th step      Shoulder Exercises: Power [employer] 10 reps    Row Limitations low seated row 25lb      Manual Therapy   Manual Therapy Soft tissue mobilization    Soft tissue mobilization prone Lt QL and  lumbar paraspinals                       PT Short Term Goals - 03/13/21 1024       PT SHORT TERM GOAL #1   Title Pt will be ind with initial HEP    Status Achieved      PT SHORT TERM GOAL #2   Title Pt will practice spinal decompression and diaphragmatic breathing at least 5 days/week in the afternoon to address spinal health and abdominal tension.    Status Achieved      PT SHORT TERM GOAL #3   Title Pt will demo reduced abdominal tension/improved resting position of the ribcage with ability to perform lateral costal expansion and abdominal breathing to normalize trunk posture and prep for proper core activation.    Status Partially Met               PT Long Term Goals - 05/08/21 0940       PT LONG TERM GOAL #1   Title Pt to be independent with final HEP with good understanding of how to transition to gym.    Baseline PT beginning to talk to Pt about how to incorporate ther ex to gym setting    Status On-going      PT LONG TERM GOAL #2   Title Pt will report ability to perform standing activities in house and yard for at least 2 hours with min or no exacerbation of pain.    Status Achieved      PT LONG TERM GOAL #3   Title Pt will demo optimal dynamic functional movement for squat, lift, carry using core and good body mechanics to reduce pain with functional tasks.    Baseline improving awareness of dynamic postural alignment, needs less cueing    Status On-going      PT LONG TERM GOAL #4   Title Pt will report at least 70% reduction in soreness with daily tasks.    Baseline 60%    Status On-going      PT LONG TERM GOAL #5   Title Pt will improve FOTO score from 52% to 59% to demo improved function.    Baseline 70%    Status Achieved                   Plan - 06/29/21 0941     Clinical Impression Statement Pt reports she thinks she is rounding the corner  from her flare up, with reduced pain to 4/10 from 7/10 last few weeks.  Manual  therapy, heat and stretches have helped.  She continues to have limited mobility Lt>Rt facet mobility at L5/S1 with reducing spasm of Lt paraspinals and QL.  Pt was able to return to mat based core and LE ther ex today with good tolerance.  She had been fearful of return to ther ex and was encouraged by her tolerance and activity level today and even reported reduced pain to 1/10 end of session.  She may trial a return to gym.    PT Frequency 1x / week    PT Duration 8 weeks    PT Treatment/Interventions ADLs/Self Care Home Management;Electrical Stimulation;Cryotherapy;Moist Heat;Traction;Therapeutic exercise;Neuromuscular re-education;Patient/family education;Manual techniques;Passive range of motion;Dry needling;Functional mobility training;Spinal Manipulations    PT Next Visit Plan continue joint mobs, traction, STM to address pain flare up, heat/try estim, continue gentle stretching and light core return    PT Home Exercise Plan Access Code: [email or web address]    Consulted and Agree with Plan of Care Patient             Patient will benefit from skilled therapeutic intervention in order to improve the following deficits and impairments:     Visit Diagnosis: Chronic bilateral low back pain without sciatica  Cramp and spasm  Muscle weakness (generalized)     Problem List Patient Active Problem List   Diagnosis Date Noted   GAD (generalized anxiety disorder) 10/26/2020   MRSA nasal colonization 10/26/2020   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   Polymyalgia rheumatica ([place]) 10/23/2019   Spinal stenosis, lumbar region without neurogenic claudication 10/23/2019   Lumbar facet arthropathy 10/23/2019   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   Spondylosis of cervical region without myelopathy or radiculopathy 05/29/2018   Family history of Parkinson disease 05/21/2016   Seborrheic dermatitis 11/02/2014   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

## 2021-07-06 ENCOUNTER — Ambulatory Visit: Payer: Medicare Other | Admitting: Physical Therapy

## 2021-07-06 ENCOUNTER — Encounter: Payer: Self-pay | Admitting: Physical Therapy

## 2021-07-06 ENCOUNTER — Other Ambulatory Visit: Payer: Self-pay

## 2021-07-06 DIAGNOSIS — M545 Low back pain, unspecified: Secondary | ICD-10-CM | POA: Diagnosis not present

## 2021-07-06 DIAGNOSIS — R252 Cramp and spasm: Secondary | ICD-10-CM

## 2021-07-06 DIAGNOSIS — M6281 Muscle weakness (generalized): Secondary | ICD-10-CM

## 2021-07-06 NOTE — Progress Notes (Signed)
[facility] [address] [place] [address] Phone: [phone] Subjective:   [name], am serving as a scribe for Dr. Antoine Primas. This visit occurred during the SARS-CoV-2 public health emergency.  Safety protocols were in place, including screening questions prior to the visit, additional usage of staff PPE, and extensive cleaning of exam room while observing appropriate contact time as indicated for disinfecting solutions.   I'm seeing this patient by the request  of:  Cheryl Ora, MD  CC: Low back pain  [id number]  Cheryl Austin is a 80 y.o. female coming in with complaint of LBP. Patient states pain in chronic. Was doing PT and that helped, then feel like there was an incident 4 weeks ago and now pain in one area is not going away. Left side is worse than the right around SI joint. Takes meloxicam when in pain. Doesn't like to take medications. Heat does relieve pain.  Patient has been trying some vitamins with very minimal improvement.  States that if anything seems worse  MRI lumbar October 2020 IMPRESSION: 1. Mild disc bulging with facet hypertrophy at L2-3, L3-4, and L4-5 with resultant mild canal, with mild bilateral L2 through L4 foraminal stenosis. No frank impingement. 2. Small right paracentral disc protrusion at T12-L1 without stenosis or impingement.       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 Corliss Skains   Restless legs syndrome (RLS) 08/02/2017   Spinal stenosis, lumbar region without neurogenic claudication 10/23/2019   By lumbar MRI  06/2019, mild   Thyroid disease    Hypothyroid   Past Surgical History:  Procedure Laterality Date   APPENDECTOMY  1961   BREAST SURGERY Bilateral 1987 1998   Lumpectomy   EYE SURGERY     TOTAL ABDOMINAL HYSTERECTOMY  1986   BSO/Fibroids   Social History   Socioeconomic History   Marital status: Widowed    Spouse name: Not on file   Number of children: 1   Years of education: 82   Highest education level: Not on file  Occupational History   Occupation: Retired  Tobacco Use   Smoking status: Never   Smokeless tobacco: Never  Vaping Use   Vaping Use: Never used  Substance and Sexual Activity   Alcohol use: Yes    Comment: couple of times weekly (1 drink)   Drug use: No   Sexual activity: Never    Partners: Male    Birth control/protection: Post-menopausal  Other Topics Concern   Not on file  Social History Narrative   Drinks 2-3 caffeine drinks a day    Social Determinants of Health   Financial Resource Strain: Low Risk    Difficulty of Paying Living Expenses: Not hard at all  Food Insecurity: No Food Insecurity   Worried About Programme researcher, broadcasting/film/video in the Last Year: Never true   Barista in the Last Year: Never true  Transportation Needs: No Transportation Needs   Lack of Transportation (Medical): No   Lack of Transportation (Non-Medical): No  Physical  Activity: Inactive   Days of Exercise per Week: 0 days   Minutes of Exercise per Session: 0 min  Stress: No Stress Concern Present   Feeling of Stress : Only a little  Social Connections: Socially Isolated   Frequency of Communication with Friends and Family: More than three times a week   Frequency of Social Gatherings with Friends and Family: More than three times a week   Attends Religious Services: Never   Database administrator or Organizations: No   Attends Banker Meetings: Never   Marital Status: Widowed   No Known Allergies Family History  Problem Relation Age of Onset   Lupus  Mother    Asthma Mother    Parkinson's disease Mother    Depression Mother    Heart disease Father    Hyperlipidemia Father    Stroke Father    Alcohol abuse Daughter    Depression Daughter     Current Outpatient Medications (Endocrine & Metabolic):    alendronate (FOSAMAX) 70 MG tablet, TAKE 1 TABLET (70 MG TOTAL) BY MOUTH ONCE A WEEK. TAKE WITH A FULL GLASS OF WATER ON AN EMPTY STOMACH.   levothyroxine (SYNTHROID) 50 MCG tablet, TAKE 1 TABLET BY MOUTH EVERY DAY    Current Outpatient Medications (Analgesics):    HYDROcodone-acetaminophen (NORCO/VICODIN) 5-325 MG tablet, Take 1 tablet by mouth every 4 (four) hours as needed. (Patient not taking: Reported on 06/08/2021)   meloxicam (MOBIC) 15 MG tablet, Take 0.5-1 tablets by mouth daily as needed.   meloxicam (MOBIC) 7.5 MG tablet, Take 7.5 mg by mouth daily.   traMADol (ULTRAM) 50 MG tablet, Take by mouth every 6 (six) hours as needed. (Patient not taking: Reported on 06/08/2021)   Current Outpatient Medications (Other):    gabapentin (NEURONTIN) 100 MG capsule, Take 1 capsule (100 mg total) by mouth at bedtime.   acyclovir (ZOVIRAX) 200 MG capsule, Take 600 mg by mouth daily. (Patient not taking: Reported on 06/08/2021)   betamethasone dipropionate 0.05 % cream, APPLY TO AFFECTED AREA TWICE A DAY (Patient taking differently: as needed. APPLY TO AFFECTED AREA TWICE A DAY)   LORazepam (ATIVAN) 0.5 MG tablet, Take 1-2 tablets by mouth daily as needed.   mupirocin ointment (BACTROBAN) 2 %, Apply to affected area 1-2 times daily   sertraline (ZOLOFT) 100 MG tablet, Take 0.5 tablets (50 mg total) by mouth daily.   traZODone (DESYREL) 100 MG tablet, Take 100 mg by mouth as needed for sleep.   Reviewed prior external information including notes and imaging from  primary care provider As well as notes that were available from care everywhere and other healthcare systems.  Past medical history, social, surgical and family history all  reviewed in electronic medical record.  No pertanent information unless stated regarding to the chief complaint.   Review of Systems:  No headache, visual changes, nausea, vomiting, diarrhea, constipation, dizziness, abdominal pain, skin rash, fevers, chills, night sweats, weight loss, swollen lymph nodes, body aches, joint swelling, chest pain, shortness of breath, mood changes. POSITIVE muscle aches  Objective  Blood pressure 120/78, pulse 81, height 5\' 4"  (1.626 m), weight 117 lb (53.1 kg), SpO2 96 %.   General: No apparent distress alert and oriented x3 mood and affect normal, dressed appropriately.  HEENT: Pupils equal, extraocular movements intact  Respiratory: Patient's speak in full sentences and does not appear short of breath  Cardiovascular: No lower extremity edema, non tender, no erythema  Gait mild antalgic Low back exam does  have some loss of lordosis with some scoliosis.  Good range of motion noted with flexion and extension noted.  Patient is tender to palpation around the L3 and L4 levels almost midline just to the left no masses appreciated.  No breakdown of the skin.  Patient does have tightness noted with FABER test bilaterally with some mild radicular symptoms in the quadratus lumborum but none down the legs.  Neurovascularly intact distally.  [id number]; 15 additional minutes spent for Therapeutic exercises as stated in above notes.  This included exercises focusing on stretching, strengthening, with significant focus on eccentric aspects.   Long term goals include an improvement in range of motion, strength, endurance as well as avoiding reinjury. Patient's frequency would include in 1-2 times a day, 3-5 times a week for a duration of 6-12 weeks.  Low back exercises that included:  Pelvic tilt/bracing instruction to focus on control of the pelvic girdle and lower abdominal muscles  Glute strengthening exercises, focusing on proper firing of the glutes without engaging the low back  muscles Proper stretching techniques for maximum relief for the hamstrings, hip flexors, low back and some rotation where tolerated. Proper technique shown and discussed handout in great detail with ATC.  All questions were discussed and answered.      Impression and Recommendations:     The above documentation has been reviewed and is accurate and complete Judi Saa, DO

## 2021-07-06 NOTE — Therapy (Addendum)
[facility] @ [place] [address] [place], [place], [address] Phone: [phone]   Fax:  [phone]  Physical Therapy Treatment  Patient Details  Name: Cheryl [facility] MRN: [id number] Date of Birth: 1940/10/09 Referring Provider (PT): M54.50,G89.29 (ICD-10-CM) - Chronic low back pain, unspecified back pain laterality, unspecified whether sciatica present   Encounter Date: 07/06/2021   PT End of Session - 07/06/21 0937     Visit Number 29    Date for PT Re-Evaluation 07/19/21    Authorization Type [employer] Medicare    Progress Note Due on Visit 30    PT Start Time 0930    PT Stop Time 1013    PT Time Calculation (min) 43 min    Activity Tolerance Patient tolerated treatment well    Behavior During Therapy [facility] for tasks assessed/performed             Past Medical History:  Diagnosis Date   Anxiety    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   By lumbar MRI 06/2019, mild   Thyroid disease    Hypothyroid    Past Surgical History:  Procedure Laterality Date   APPENDECTOMY  1961   BREAST SURGERY Bilateral [place]   BSO/Fibroids    There were no vitals filed for this visit.   Subjective Assessment - 07/06/21 0930     Subjective I felt improved for about 4-5 days after last session and had improved activity tolerance.  It is worsening again now.    Pertinent History PMH: polymyalgia rheumatica, osteoporosis     Limitations Standing;Walking    How long can you stand comfortably? 1-2 hours (sitting improves pain)    How long can you walk comfortably? 1-2 hours    Diagnostic tests lumbar xray 2021L5/S1 mild spondylosis, moderate facet joint arthropathy    Patient Stated Goals be able to stand longer before needing to sit, get rid of pain, get back to gym and learn exercises    Currently in Pain? Yes    Pain Score 4     Pain Location Back    Pain Orientation Left;Lower    Pain Descriptors / Indicators Aching;Tightness    Pain Type Chronic pain    Pain Frequency Constant    Aggravating Factors  overdoing it    Pain Relieving Factors heat, stretch, pacing                               [facility] Adult PT Treatment/Exercise - 07/06/21 0001       Exercises   Exercises Shoulder;Lumbar;Knee/Hip      Lumbar Exercises: Stretches   Other Lumbar Stretch Exercise 3-way seated ball rollouts 2x10 sec each      Lumbar Exercises: Aerobic   Recumbent Bike L3 x 5' with lumbar heat, PT present to discuss plan for session today      Lumbar Exercises:  Quadruped   Madcat/Old Horse 5 reps    Madcat/Old Horse Limitations 5 sec, add tail wag x 6 each on sag    Opposite Arm/Leg Raise Right arm/Left leg;Left arm/Right leg    Opposite Arm/Leg Raise Limitations 3x5" each      Knee/Hip Exercises: Stretches   Hip Flexor Stretch Both;2 reps;10 seconds    Hip Flexor Stretch Limitations doorframe reach up/over      Manual Therapy   Manual Therapy Soft tissue mobilization;Joint mobilization    Joint Mobilization Lt L5/S1 UPA and bil PAs to facets Gr I-III, sacral rocking              Trigger Point Dry Needling - 07/06/21 0001     Consent Given? Yes    Education Handout Provided Previously provided    Muscles Treated Back/Hip Lumbar multifidi    Dry Needling Comments bil L4-S1    Lumbar multifidi Response Twitch response elicited;Palpable increased muscle length                      PT Short Term Goals - 03/13/21 1024       PT SHORT TERM GOAL #1   Title Pt will be ind with initial HEP    Status Achieved      PT SHORT TERM GOAL #2   Title Pt will practice spinal decompression and diaphragmatic breathing at least 5 days/week in the afternoon to address spinal health and abdominal tension.    Status Achieved      PT SHORT TERM GOAL #3   Title Pt will demo reduced abdominal tension/improved resting position of the ribcage with ability to perform lateral costal expansion and abdominal breathing to normalize trunk posture and prep for proper core activation.    Status Partially Met               PT Long Term Goals - 05/08/21 0940       PT LONG TERM GOAL #1   Title Pt to be independent with final HEP with good understanding of how to transition to gym.    Baseline PT beginning to talk to Pt about how to incorporate ther ex to gym setting    Status On-going      PT LONG TERM GOAL #2   Title Pt will report ability to perform standing activities in house and yard for at least 2 hours with min or no exacerbation of pain.    Status Achieved      PT LONG TERM GOAL #3   Title Pt will demo optimal dynamic functional movement for squat, lift, carry using core and good body mechanics to reduce pain with functional tasks.    Baseline improving awareness of dynamic postural alignment, needs less cueing    Status On-going      PT LONG TERM GOAL #4   Title Pt will report at least 70% reduction in soreness with daily tasks.    Baseline 60%    Status On-going      PT LONG TERM GOAL #5   Title Pt will improve FOTO score from 52% to 59% to demo improved function.    Baseline 70%    Status Achieved                   Plan - 07/06/21 1015     Clinical Impression Statement Pt reported improved pain x 4-5 days following last session but is discouraged about return of LBP.  She sees sports  medicine MD as a new patient tomorrow.  Pt continues  to have limited extension mobility of L5/S1 left facet and Lt superior pole of sacrum.  She needed cueing to maintain midline stabilization with bird dogs today via more use of serratus anterior and hip abductors of stance limbs.  She feels relief with flexion based stretching.  She had twitch and release of Lt L4/5 lumbar multifidus today and good response to Lt superior pole and Lt L5/S1 joint mobs today.  Continue along POC.    PT Frequency 1x / week    PT Duration 8 weeks    PT Treatment/Interventions ADLs/Self Care Home Management;Electrical Stimulation;Cryotherapy;Moist Heat;Traction;Therapeutic exercise;Neuromuscular re-education;Patient/family education;Manual techniques;Passive range of motion;Dry needling;Functional mobility training;Spinal Manipulations    PT Next Visit Plan continue joint mobs, traction, STM to address pain flare up, heat/try estim, continue gentle stretching and light core return    PT Home Exercise Plan Access Code: [email or web address]    Consulted and Agree with Plan of Care Patient             Patient will benefit from skilled therapeutic intervention in order to improve the following deficits and impairments:     Visit Diagnosis: Chronic bilateral low back pain without sciatica  Cramp and spasm  Muscle weakness (generalized)     Problem List Patient Active Problem List   Diagnosis Date Noted   GAD (generalized anxiety disorder) 10/26/2020   MRSA nasal colonization 10/26/2020   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   Polymyalgia rheumatica ([place]) 10/23/2019   Spinal stenosis, lumbar region without neurogenic claudication 10/23/2019   Lumbar facet arthropathy 10/23/2019   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   Spondylosis of cervical region without myelopathy or radiculopathy 05/29/2018   Family history of Parkinson disease 05/21/2016   Seborrheic dermatitis 11/02/2014   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  FOTO score intial was 52%, improved to 70%, then regressed to 57% given set back of lumbar pain.  She has HEP and will manage symptoms on her own for 1-2 months and understands she may return with Rx from MD if she needs more PT after taking a break with self-management.  She hopes to get back into yoga classes.    Remaining deficits: See above   Education / Equipment: HEP  Patient agrees to discharge. Patient goals were partially met. Patient is being discharged due to maximized rehab potential.   Baruch Merl, PT 07/18/21 9:59 AM  [facility] @ [place] [address] [place], [place], [address] Phone: [phone]   Fax:  [phone]  Name: Chari Parmenter MRN: [id number] Date of Birth: [birth year]

## 2021-07-07 ENCOUNTER — Ambulatory Visit (INDEPENDENT_AMBULATORY_CARE_PROVIDER_SITE_OTHER): Payer: Medicare Other | Admitting: Family Medicine

## 2021-07-07 ENCOUNTER — Ambulatory Visit (INDEPENDENT_AMBULATORY_CARE_PROVIDER_SITE_OTHER): Payer: Medicare Other

## 2021-07-07 ENCOUNTER — Encounter: Payer: Self-pay | Admitting: Family Medicine

## 2021-07-07 VITALS — BP 120/78 | HR 81 | Ht 64.0 in | Wt 117.0 lb

## 2021-07-07 DIAGNOSIS — M549 Dorsalgia, unspecified: Secondary | ICD-10-CM | POA: Diagnosis not present

## 2021-07-07 DIAGNOSIS — M47816 Spondylosis without myelopathy or radiculopathy, lumbar region: Secondary | ICD-10-CM | POA: Diagnosis not present

## 2021-07-07 MED ORDER — GABAPENTIN 100 MG PO CAPS: 100.0000 mg | ORAL_CAPSULE | Freq: Every day | ORAL | 1 refills | Status: DC

## 2021-07-07 NOTE — Patient Instructions (Addendum)
Do prescribed exercises at least 3x a week Xray today Gabapentin 100mg  at night At least half your trazodone Tart Cherry 1200mg  Ice See you again in 6 weeks

## 2021-07-07 NOTE — Assessment & Plan Note (Signed)
History of facet arthropathy.  He is doing little bit better with the formal physical therapy but did have an exacerbation at this moment.  Patient's previous imaging has shown that there is facet arthropathy and we will get x-rays to further evaluate.  Patient's pain is just to the left of midline.  So x-rays to rule out any type of compression fracture that could be contributing.  With patient having with low back pain encourage patient to try gabapentin 100 mg at night.  Discussed icing regimen otherwise.  Follow-up with me again 6 weeks.  Worsening pain will consider formal physical therapy at that time.  Any radicular symptoms that we will need to consider advanced imaging.

## 2021-07-11 ENCOUNTER — Telehealth: Payer: Self-pay

## 2021-07-11 ENCOUNTER — Telehealth (INDEPENDENT_AMBULATORY_CARE_PROVIDER_SITE_OTHER): Payer: Medicare Other | Admitting: Family Medicine

## 2021-07-11 ENCOUNTER — Encounter: Payer: Self-pay | Admitting: Family Medicine

## 2021-07-11 ENCOUNTER — Encounter: Payer: Medicare Other | Admitting: Physical Therapy

## 2021-07-11 VITALS — Ht 64.0 in | Wt 115.0 lb

## 2021-07-11 DIAGNOSIS — J029 Acute pharyngitis, unspecified: Secondary | ICD-10-CM | POA: Diagnosis not present

## 2021-07-11 DIAGNOSIS — U071 COVID-19: Secondary | ICD-10-CM | POA: Diagnosis not present

## 2021-07-11 MED ORDER — AMOXICILLIN 875 MG PO TABS: 875.0000 mg | ORAL_TABLET | Freq: Two times a day (BID) | ORAL | 0 refills | Status: DC

## 2021-07-11 MED ORDER — MOLNUPIRAVIR 200 MG PO CAPS: 4.0000 | ORAL_CAPSULE | Freq: Two times a day (BID) | ORAL | 0 refills | Status: AC

## 2021-07-11 MED ORDER — AZELASTINE HCL 0.1 % NA SOLN: 2.0000 | Freq: Two times a day (BID) | NASAL | 12 refills | Status: DC

## 2021-07-11 NOTE — Telephone Encounter (Signed)
Please advise 

## 2021-07-11 NOTE — Addendum Note (Signed)
Addended by: Ardith Dark on: 07/11/2021 02:25 PM   Modules accepted: Orders

## 2021-07-11 NOTE — Progress Notes (Addendum)
   Cheryl Austin is a 80 y.o. female who presents today for a virtual office visit.  Assessment/Plan:  New/Acute Problems: Sore Throat Discussed limitations of virtual visit and inability to perform physical exam.  Recommended that patient come in for minimum of strep, flu, and COVID test.  Patient stated she would not be able to do this at this time.  She is concerned about possible strep pharyngitis.  She does not have any red flag signs or symptoms at this point.  In light of best patient care and due to upcoming Thanksgiving holiday we will send in Astelin and pocket prescription for amoxicillin.  Advised patient that we need to come in if symptoms or not improving with above.  Encouraged hydration.  Can use over-the-counter meds as needed.  Discussed reasons to return to care.  ADDENDUM: After our visit patient self tested for COVID.  This came back positive.  We will send in molnupiravir.  Advised patient to not take the amoxicillin as above. Discussed reasons to return to care.     Subjective:  HPI:   Patient with sore throat that started yesterday. Tried warm saltwater gargles. No reported difficulty breathing. No fevers or chills. She has had some malaise. No sick contacts. Some rhinorrhea and congestion. Minimal cough.        Objective/Observations  Physical Exam: Gen: NAD, resting comfortably Pulm: Normal work of breathing Neuro: Grossly normal, moves all extremities Psych: Normal affect and thought content  Virtual Visit via Video   I connected with Cheryl Austin on 07/11/21 at 10:00 AM EST by a video enabled telemedicine application and verified that I am speaking with the correct person using two identifiers. The limitations of evaluation and management by telemedicine and the availability of in person appointments were discussed. The patient expressed understanding and agreed to proceed.   Patient location: Home Provider location: [place] [employer] Persons participating in the virtual visit: Myself and Patient     Katina Degree. Jimmey Ralph, MD 07/11/2021 10:12 AM

## 2021-07-11 NOTE — Telephone Encounter (Signed)
Dr Jimmey Ralph aware or results  Rx send to pharmacy  Patient notified per Dr Jimmey Ralph do not take amoxicillin New Rx send to pharmacy

## 2021-07-11 NOTE — Telephone Encounter (Signed)
Patient is calling in stating she did a virtual visit with Dr.Parker, advised to do a covid test. [place] did an at home covid test and she said there was two pink lines meaning she was positive. Patient wants to know what the next steps are going to be.

## 2021-07-18 ENCOUNTER — Ambulatory Visit: Payer: Medicare Other | Admitting: Physical Therapy

## 2021-07-25 ENCOUNTER — Encounter: Payer: Medicare Other | Admitting: Physical Therapy

## 2021-08-16 NOTE — Progress Notes (Signed)
[facility] [address] [place] [address] Phone: [phone] Subjective:   Cheryl Austin, am serving as a scribe for Dr. Antoine Primas. This visit occurred during the SARS-CoV-2 public health emergency.  Safety protocols were in place, including screening questions prior to the visit, additional usage of staff PPE, and extensive cleaning of exam room while observing appropriate contact time as indicated for disinfecting solutions.   I'm seeing this patient by the request  of:  [name], MD  CC: back pain follow up   BPZ:WCHENIDPOE  07/07/2021 History of facet arthropathy.  He is doing little bit better with the formal physical therapy but did have an exacerbation at this moment.  Patient's previous imaging has shown that there is facet arthropathy and we will get x-rays to further evaluate.  Patient's pain is just to the left of midline.  So x-rays to rule out any type of compression fracture that could be contributing.  With patient having with low back pain encourage patient to try gabapentin 100 mg at night.  Discussed icing regimen otherwise.  Follow-up with me again 6 weeks.  Worsening pain will consider formal physical therapy at that time.  Any radicular symptoms that we will need to consider advanced imaging.  Update 08/17/2021 Cheryl Austin is a 80 y.o. female coming in with complaint of lumbar facet pain.  Patient was given gabapentin and home exercises.  Patient states back is still in pain. Some days are a little better. Getting back in the gym. Go over xrays.  Patient still states that it does affect daily activities.  Sometimes it does affect nighttime pain as well.  Still unable to increase activity like she would like to.  Lumbar xray 07/07/2021 IMPRESSION: 1. Progression of moderate degenerative changes at L2-L3. There is new grade 1 retrolisthesis at this level.     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 Corliss Skains   Restless legs syndrome (RLS) 08/02/2017   Spinal stenosis, lumbar region without neurogenic claudication 10/23/2019   By lumbar MRI 06/2019, mild   Thyroid disease    Hypothyroid   Past Surgical History:  Procedure Laterality Date   APPENDECTOMY  1961   BREAST SURGERY Bilateral 1987 1998   Lumpectomy   EYE SURGERY     TOTAL ABDOMINAL HYSTERECTOMY  1986   BSO/Fibroids   Social History   Socioeconomic History   Marital status: Widowed    Spouse name: Not on file   Number of children: 1   Years of education: 17   Highest education level: Not on file  Occupational History   Occupation: Retired  Tobacco Use   Smoking status: Never   Smokeless tobacco: Never  Vaping Use   Vaping Use: Never used  Substance and Sexual Activity   Alcohol use: Yes    Comment: couple of times weekly (1 drink)   Drug use: No   Sexual activity: Never    Partners: Male    Birth control/protection: Post-menopausal  Other Topics Concern   Not on file  Social History  Narrative   Drinks 2-3 caffeine drinks a day    Social Determinants of Corporate investment banker Strain: Low Risk    Difficulty of Paying Living Expenses: Not hard at all  Food Insecurity: No Food Insecurity   Worried About Programme researcher, broadcasting/film/video in the Last Year: Never true   Barista in the Last Year: Never true  Transportation Needs: No Transportation Needs   Lack of Transportation (Medical): No   Lack of Transportation (Non-Medical): No  Physical Activity: Inactive   Days of Exercise per Week: 0 days   Minutes of Exercise per Session: 0 min  Stress: No  Stress Concern Present   Feeling of Stress : Only a little  Social Connections: Socially Isolated   Frequency of Communication with Friends and Family: More than three times a week   Frequency of Social Gatherings with Friends and Family: More than three times a week   Attends Religious Services: Never   Database administrator or Organizations: No   Attends Banker Meetings: Never   Marital Status: Widowed   No Known Allergies Family History  Problem Relation Age of Onset   Lupus Mother    Asthma Mother    Parkinson's disease Mother    Depression Mother    Heart disease Father    Hyperlipidemia Father    Stroke Father    Alcohol abuse Daughter    Depression Daughter     Current Outpatient Medications (Endocrine & Metabolic):    alendronate (FOSAMAX) 70 MG tablet, TAKE 1 TABLET (70 MG TOTAL) BY MOUTH ONCE A WEEK. TAKE WITH A FULL GLASS OF WATER ON AN EMPTY STOMACH.   levothyroxine (SYNTHROID) 50 MCG tablet, TAKE 1 TABLET BY MOUTH EVERY DAY   Current Outpatient Medications (Respiratory):    azelastine (ASTELIN) 0.1 % nasal spray, Place 2 sprays into both nostrils 2 (two) times daily.  Current Outpatient Medications (Analgesics):    HYDROcodone-acetaminophen (NORCO/VICODIN) 5-325 MG tablet, Take 1 tablet by mouth every 4 (four) hours as needed.   meloxicam (MOBIC) 15 MG tablet, Take 0.5-1 tablets by mouth daily as needed.   meloxicam (MOBIC) 7.5 MG tablet, Take 7.5 mg by mouth daily.   traMADol (ULTRAM) 50 MG tablet, Take by mouth every 6 (six) hours as needed.   Current Outpatient Medications (Other):    acyclovir (ZOVIRAX) 200 MG capsule, Take 600 mg by mouth daily.   amoxicillin (AMOXIL) 875 MG tablet, Take 1 tablet (875 mg total) by mouth 2 (two) times daily.   betamethasone dipropionate 0.05 % cream, APPLY TO AFFECTED AREA TWICE A DAY (Patient taking differently: as needed. APPLY TO AFFECTED AREA TWICE A DAY)   gabapentin (NEURONTIN) 100 MG capsule, Take 1  capsule (100 mg total) by mouth at bedtime.   LORazepam (ATIVAN) 0.5 MG tablet, Take 1-2 tablets by mouth daily as needed.   mupirocin ointment (BACTROBAN) 2 %, Apply to affected area 1-2 times daily   sertraline (ZOLOFT) 100 MG tablet, Take 0.5 tablets (50 mg total) by mouth daily.   traZODone (DESYREL) 100 MG tablet, Take 100 mg by mouth as needed for sleep.   Reviewed prior external information including notes and imaging from  primary care provider As well as notes that were available from care everywhere and other healthcare systems.  Reviewed some of the physical therapy notes.  Past medical history, social, surgical and family history all reviewed in electronic medical record.  No pertanent information unless stated regarding to  the chief complaint.   Review of Systems:  No headache, visual changes, nausea, vomiting, diarrhea, constipation, dizziness, abdominal pain, skin rash, fevers, chills, night sweats, weight loss, swollen lymph nodes, body aches, joint swelling, chest pain, shortness of breath, mood changes. POSITIVE muscle aches  Objective  Blood pressure 122/84, pulse 70, height 5\' 4"  (1.626 m), weight 120 lb (54.4 kg), SpO2 98 %.   General: No apparent distress alert and oriented x3 mood and affect normal, dressed appropriately.  HEENT: Pupils equal, extraocular movements intact  Respiratory: Patient's speak in full sentences and does not appear short of breath  Cardiovascular: No lower extremity edema, non tender, no erythema  Gait normal with good balance and coordination.  MSK:   Patient does have loss of lordosis.  Significant tenderness to palpation in the L2, L3 area right greater than left.  Mild midline tenderness.  Only has about 5 degrees of extension.  Does have some mild atrophy of the legs bilaterally.  Neurovascular intact distally.  4-5 strength of the legs    Impression and Recommendations:     The above documentation has been reviewed and is accurate and  complete , DO

## 2021-08-17 ENCOUNTER — Other Ambulatory Visit: Payer: Self-pay

## 2021-08-17 ENCOUNTER — Ambulatory Visit (INDEPENDENT_AMBULATORY_CARE_PROVIDER_SITE_OTHER): Payer: Medicare Other | Admitting: Family Medicine

## 2021-08-17 VITALS — BP 122/84 | HR 70 | Ht 64.0 in | Wt 120.0 lb

## 2021-08-17 DIAGNOSIS — M48061 Spinal stenosis, lumbar region without neurogenic claudication: Secondary | ICD-10-CM

## 2021-08-17 NOTE — Assessment & Plan Note (Signed)
Patient has known degenerative disc disease of the lumbar spine.  Unfortunately patient has had significant worsening of the moderate degenerative changes at the L2-L3 area with a new grade 1 retrolisthesis.  I am concerned that this is causing worsening spinal stenosis at the moment.  Discussed with patient about different treatment options but because patient's pain is worsening and affecting daily activities and sleep I do feel that an MRI is necessary at this time.  Depending on findings we will see if patient is a candidate for potential injections or other treatment options including potential surgical intervention.  Follow-up with me after imaging to discuss further.

## 2021-08-17 NOTE — Patient Instructions (Addendum)
[facility] [phone] Call Today  When we receive your results we will contact you.  Discontinue Gabapentin

## 2021-08-24 ENCOUNTER — Ambulatory Visit
Admission: RE | Admit: 2021-08-24 | Discharge: 2021-08-24 | Disposition: A | Discharge status: Home / Self Care | Payer: Medicare Other | Source: Ambulatory Visit | Attending: Family Medicine | Admitting: Family Medicine

## 2021-08-24 ENCOUNTER — Other Ambulatory Visit: Payer: Self-pay

## 2021-08-24 DIAGNOSIS — M48061 Spinal stenosis, lumbar region without neurogenic claudication: Secondary | ICD-10-CM

## 2021-08-28 ENCOUNTER — Other Ambulatory Visit: Payer: Medicare Other

## 2021-09-13 ENCOUNTER — Ambulatory Visit (INDEPENDENT_AMBULATORY_CARE_PROVIDER_SITE_OTHER): Payer: Medicare Other | Admitting: Family Medicine

## 2021-09-13 ENCOUNTER — Encounter: Payer: Self-pay | Admitting: Family Medicine

## 2021-09-13 ENCOUNTER — Other Ambulatory Visit: Payer: Self-pay

## 2021-09-13 VITALS — BP 112/70 | HR 62 | Temp 97.8°F | Ht 63.0 in | Wt 117.8 lb

## 2021-09-13 DIAGNOSIS — R1310 Dysphagia, unspecified: Secondary | ICD-10-CM

## 2021-09-13 DIAGNOSIS — Z1231 Encounter for screening mammogram for malignant neoplasm of breast: Secondary | ICD-10-CM

## 2021-09-13 DIAGNOSIS — M353 Polymyalgia rheumatica: Secondary | ICD-10-CM

## 2021-09-13 DIAGNOSIS — G4709 Other insomnia: Secondary | ICD-10-CM | POA: Diagnosis not present

## 2021-09-13 DIAGNOSIS — E039 Hypothyroidism, unspecified: Secondary | ICD-10-CM

## 2021-09-13 DIAGNOSIS — F331 Major depressive disorder, recurrent, moderate: Secondary | ICD-10-CM

## 2021-09-13 DIAGNOSIS — Z0000 Encounter for general adult medical examination without abnormal findings: Secondary | ICD-10-CM

## 2021-09-13 DIAGNOSIS — T380X5A Adverse effect of glucocorticoids and synthetic analogues, initial encounter: Secondary | ICD-10-CM

## 2021-09-13 DIAGNOSIS — M818 Other osteoporosis without current pathological fracture: Secondary | ICD-10-CM

## 2021-09-13 DIAGNOSIS — M48061 Spinal stenosis, lumbar region without neurogenic claudication: Secondary | ICD-10-CM

## 2021-09-13 LAB — LIPID PANEL
Cholesterol: 196 mg/dL (ref 0–200)
HDL: 87.2 mg/dL (ref >39.00)
LDL Cholesterol: 98 mg/dL (ref 0–99)
NonHDL: 108.49
Total CHOL/HDL Ratio: 2
Triglycerides: 52 mg/dL (ref 0.0–149.0)
VLDL: 10.4 mg/dL (ref 0.0–40.0)

## 2021-09-13 LAB — CBC WITH DIFFERENTIAL/PLATELET
Basophils Absolute: 0 10*3/uL (ref 0.0–0.1)
Basophils Relative: 0.7 % (ref 0.0–3.0)
Eosinophils Absolute: 0.1 10*3/uL (ref 0.0–0.7)
Eosinophils Relative: 4.1 % (ref 0.0–5.0)
HCT: 40.8 % (ref 36.0–46.0)
Hemoglobin: 13.5 g/dL (ref 12.0–15.0)
Lymphocytes Relative: 33.4 % (ref 12.0–46.0)
Lymphs Abs: 1.1 10*3/uL (ref 0.7–4.0)
MCHC: 33.2 g/dL (ref 30.0–36.0)
MCV: 87.3 fl (ref 78.0–100.0)
Monocytes Absolute: 0.3 10*3/uL (ref 0.1–1.0)
Monocytes Relative: 8.4 % (ref 3.0–12.0)
Neutro Abs: 1.8 10*3/uL (ref 1.4–7.7)
Neutrophils Relative %: 53.4 % (ref 43.0–77.0)
Platelets: 232 10*3/uL (ref 150.0–400.0)
RBC: 4.67 Mil/uL (ref 3.87–5.11)
RDW: 13.8 % (ref 11.5–15.5)
WBC: 3.4 10*3/uL — ABNORMAL LOW (ref 4.0–10.5)

## 2021-09-13 LAB — COMPREHENSIVE METABOLIC PANEL
ALT: 16 U/L (ref 0–35)
AST: 24 U/L (ref 0–37)
Albumin: 4.4 g/dL (ref 3.5–5.2)
Alkaline Phosphatase: 53 U/L (ref 39–117)
BUN: 10 mg/dL (ref 6–23)
CO2: 28 mEq/L (ref 19–32)
Calcium: 9.4 mg/dL (ref 8.4–10.5)
Chloride: 100 mEq/L (ref 96–112)
Creatinine, Ser: 0.81 mg/dL (ref 0.40–1.20)
GFR: 69.21 mL/min (ref >60.00)
Glucose, Bld: 90 mg/dL (ref 70–99)
Potassium: 4.4 mEq/L (ref 3.5–5.1)
Sodium: 135 mEq/L (ref 135–145)
Total Bilirubin: 0.6 mg/dL (ref 0.2–1.2)
Total Protein: 6.6 g/dL (ref 6.0–8.3)

## 2021-09-13 LAB — TSH: TSH: 0.62 u[IU]/mL (ref 0.35–5.50)

## 2021-09-13 LAB — SEDIMENTATION RATE: Sed Rate: 4 mm/hr (ref 0–30)

## 2021-09-13 MED ORDER — SERTRALINE HCL 50 MG PO TABS: 50.0000 mg | ORAL_TABLET | Freq: Every day | ORAL | 3 refills | Status: DC

## 2021-09-13 MED ORDER — OMEPRAZOLE 20 MG PO CPDR: 20.0000 mg | DELAYED_RELEASE_CAPSULE | Freq: Every day | ORAL | 3 refills | Status: DC

## 2021-09-13 MED ORDER — SHINGRIX 50 MCG/0.5ML IM SUSR: 0.5000 mL | Freq: Once | INTRAMUSCULAR | 0 refills | Status: AC

## 2021-09-13 NOTE — Patient Instructions (Signed)
Please return in 12 months for your annual complete physical; please come fasting.   I will release your lab results to you on your MyChart account with further instructions. Please reply with any questions.    Please take the prescription for Shingrix to the pharmacy so they may administer the vaccinations. Your insurance will then cover the injections.   Hold your fosamax for 1-3 months and start omeprazole daily to help your stomach sxs. We will call you to get you scheduled for the Upper GI test.   If you have any questions or concerns, please don't hesitate to send me a message via MyChart or call the office at [phone]. Thank you for visiting with [place] today! It's our pleasure caring for you.   I have ordered a mammogram and/or bone density for you as we discussed today: [x]   Mammogram  []   Bone Density  Please call the office checked below to schedule your appointment:  [x]   [facility]      [address] [place],        [address] Wing         []   [facility]  [address] [place],  [facility]

## 2021-09-13 NOTE — Progress Notes (Signed)
Subjective  Chief Complaint  Patient presents with   Annual Exam    Fasting    HPI: Cheryl Austin is a 81 y.o. female who presents to [facility] today for a Female Wellness Visit. She also has the concerns and/or needs as listed above in the chief complaint. These will be addressed in addition to the Health Maintenance Visit.   Wellness Visit: annual visit with health maintenance review and exam without Pap  HM: Patient is eligible for mammogram.  In the past she has declined.  Last mammogram was in 2016.  However, today she is willing to get a mammogram.  Other screens are up-to-date.  Eligible for Shingrix vaccination Chronic disease f/u and/or acute problem visit: (deemed necessary to be done in addition to the wellness visit): Hypothyroidism on levothyroxine daily.  Feels that this is well controlled.  Does not miss doses Depression currently not active.  Remains well controlled on sertraline 50 mg daily. History of PMR: Fortunately no current flares.  No shoulder girdle or hip pain.  No longer on prednisone. 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  Bone densities are up-to-date, next due in 1 year. However, complains of heartburn, reflux and an episode of odynophagia with dysphagia while eating oatmeal.  Feels like food sometimes gets stuck.  She is having to chew her food very carefully.  Lots of belching and gas as well.  No hematemesis, nausea or vomiting.  No melena. Insomnia is stable on trazodone. Low back pain with spinal stenosis and claudication: Reviewed sports medicine's notes.  Now going to see a neurosurgeon for possible surgical intervention.  Tries not to take any pain medications.  She will take meloxicam on most days of the week however.  Assessment  1. Annual physical exam   2. Acquired hypothyroidism   3. Moderate episode of recurrent major depressive disorder ([place])   4. Polymyalgia rheumatica (HCC) Chronic  5. Other insomnia   6.  Spinal stenosis, lumbar region without neurogenic claudication   7. Steroid-induced osteoporosis   8. Odynophagia   9. Encounter for screening mammogram for malignant neoplasm of breast      Plan  Female Wellness Visit: Age appropriate Health Maintenance and Prevention measures were discussed with patient. Included topics are cancer screening recommendations, ways to keep healthy (see AVS) including dietary and exercise recommendations, regular eye and dental care, use of seat belts, and avoidance of moderate alcohol use and tobacco use. Mammogram ordered and encouraged. BMI: discussed patient's BMI and encouraged positive lifestyle modifications to help get to or maintain a target BMI. HM needs and immunizations were addressed and ordered. See below for orders. See HM and immunization section for updates. Shingrix RX given; counseling done  Routine labs and screening tests ordered including cmp, cbc and lipids where appropriate. Discussed recommendations regarding Vit D and calcium supplementation (see AVS)  Chronic disease management visit and/or acute problem visit: Low thyroid: clinically euthyroid. Recheck today Depression: well controlled on sertraline 50; refilled.  PMR: remission Insomnia: controlled on nightly trazadone Osteoporosis and UGI sxs: ? Gastritis; hold fosamax. Start omeprazole 20 daily and ordered Upper GI. Will reassess in 1-3 months depending upon sxs and imaging results.  Back pain: referred to NS.  Follow up: Return in about 1 year (around 09/13/2022) for complete physical.  Orders Placed This Encounter  Procedures   DG UGI W SINGLE CM (SOL OR THIN BA)   MM DIGITAL SCREENING BILATERAL   CBC with Differential/Platelet  Comprehensive metabolic panel   Lipid panel   TSH   Sedimentation rate   Meds ordered this encounter  Medications   omeprazole (PRILOSEC) 20 MG capsule    Sig: Take 1 capsule (20 mg total) by mouth daily.    Dispense:  30 capsule    Refill:   3   sertraline (ZOLOFT) 50 MG tablet    Sig: Take 1 tablet (50 mg total) by mouth daily.    Dispense:  90 tablet    Refill:  3   Zoster Vaccine Adjuvanted [facility]) injection    Sig: Inject 0.5 mLs into the muscle once for 1 dose. Please give 2nd dose 2-6 months after first dose    Dispense:  2 each    Refill:  0      Body mass index is 20.87 kg/m. Wt Readings from Last 3 Encounters:  09/13/21 117 lb 12.8 oz (53.4 kg)  08/17/21 120 lb (54.4 kg)  07/11/21 115 lb (52.2 kg)     Patient Active Problem List   Diagnosis Date Noted   Polymyalgia rheumatica ([place]) 10/23/2019    Priority: High    Aug 2020, Dr. 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 Worsening. rec restarting. deveshwar eval as well. On fosamax DEXA 07/2018 T = - 2.3 lowest; mild worsening. Started fosamax due to chronic pred 2021. DEXA 07/2105: T =  -1.2, 06/2013; stable from prior. Continue cal and vit d.     Dermatitis, atopic 01/30/2013    Priority: Medium     Dr. 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   Health Maintenance  Topic Date Due   Zoster Vaccines- Shingrix (1 of 2) Never done   MAMMOGRAM  08/04/2016   COVID-19 Vaccine (5 - Booster) 12/30/2019   DEXA SCAN  10/26/2022   TETANUS/TDAP  06/20/2023   Pneumonia Vaccine 54+ Years old  Completed   INFLUENZA VACCINE  Completed   Hepatitis C Screening  Completed   HPV VACCINES  Aged Out   Immunization History  Administered Date(s) Administered   DT (Pediatric) 05/21/2008   Fluad Quad(high Dose 65+) 04/27/2020, 06/08/2021  Influenza Split 06/20/2010, 05/14/2011, 06/18/2012, 06/22/2013, 07/02/2014, 07/05/2015, 05/21/2016, 05/29/2018   Influenza, High Dose Seasonal PF 06/18/2012, 06/18/2012, 06/22/2013, 06/22/2013, 07/02/2014, 07/02/2014, 07/05/2015, 07/05/2015, 05/21/2016, 05/21/2016, 08/02/2017, 04/27/2020   Influenza, Seasonal, Injecte, Preservative Fre 05/14/2011   Influenza,inj,Quad PF,6+ Mos 05/21/2016, 05/29/2018   Influenza,trivalent, recombinat, inj, PF 05/14/2011   Influenza-Unspecified 05/14/2011, 06/18/2012, 06/22/2013, 07/02/2014, 07/05/2015   PFIZER Comirnaty(Gray Top)Covid-19 Tri-Sucrose Vaccine 10/11/2019, 11/04/2019   PFIZER(Purple Top)SARS-COV-2 Vaccination 10/11/2019, 11/04/2019   Pneumococcal Conjugate-13 07/04/2014, 07/04/2014   Pneumococcal Polysaccharide-23 03/20/2005, 06/22/2011   Pneumococcal-Unspecified 03/20/2005, 06/22/2011   Td 05/21/2008   Tdap 05/21/2008, 06/22/2011, 06/19/2013   We updated and reviewed the patient's past history in detail and it is documented below. Allergies: Patient has No Known Allergies. Past Medical History Patient  has a past medical history of Anxiety, Cutaneous lupus erythematosus, Family  history of Parkinson disease (05/21/2016), GERD (gastroesophageal reflux disease), Kidney failure, Moderate episode of recurrent major depressive disorder ([place]), Osteoporosis, Polymyalgia rheumatica ([place]) (10/23/2019), Restless legs syndrome (RLS) (08/02/2017), Spinal stenosis, lumbar region without neurogenic claudication (10/23/2019), and Thyroid disease. Past Surgical History Patient  has a past surgical history that includes Total abdominal hysterectomy (1986); Appendectomy (1961); Eye surgery; and Breast surgery (Bilateral, 1987 1998). Family History: Patient family history includes Alcohol abuse in her daughter; Asthma in her mother; Depression in her daughter and mother; Heart disease in her father; Hyperlipidemia in her father; Lupus in her mother; Parkinson's disease in her mother; Stroke in her father. Social History:  Patient  reports that she has never smoked. She has never used smokeless tobacco. She reports current alcohol use. She reports that she does not use drugs.  Review of Systems: Constitutional: negative for fever or malaise Ophthalmic: negative for photophobia, double vision or loss of vision Cardiovascular: negative for chest pain, dyspnea on exertion, or new LE swelling Respiratory: negative for SOB or persistent cough Gastrointestinal: negative for abdominal pain, change in bowel habits or melena Genitourinary: negative for dysuria or gross hematuria, no abnormal uterine bleeding or disharge Musculoskeletal: negative for new gait disturbance or muscular weakness Integumentary: negative for new or persistent rashes, no breast lumps Neurological: negative for TIA or stroke symptoms Psychiatric: negative for SI or delusions Allergic/Immunologic: negative for hives  Patient Care Team    Relationship Specialty Notifications Start End  Leamon Arnt, MD PCP - General Family Medicine  06/11/16   Lyndee Hensen, PT Physical Therapist Physical Therapy  03/31/19     Objective   Vitals: BP 112/70    Pulse 62    Temp 97.8 F (36.6 C) (Temporal)    Ht 5\' 3"  (1.6 m)    Wt 117 lb 12.8 oz (53.4 kg)    SpO2 95%    BMI 20.87 kg/m  General:  Well developed, well nourished, no acute distress  Psych:  Alert and orientedx3,normal mood and affect HEENT:  Normocephalic, atraumatic, non-icteric sclera,  supple neck without adenopathy, mass or thyromegaly Cardiovascular:  Normal S1, S2, RRR without gallop, rub or murmur Respiratory:  Good breath sounds bilaterally, CTAB with normal respiratory effort Gastrointestinal: normal bowel sounds, soft, non-tender, no noted masses. No HSM MSK: no deformities, contusions. Joints are without erythema or swelling.  Skin:  Warm, no rashes or suspicious lesions noted Neurologic:    Mental status is normal. CN 2-11 are normal. Gross motor and sensory exams are normal. Normal gait. No tremor Breast Exam: declined   Commons side effects, risks, benefits, and alternatives for medications and treatment plan prescribed today were discussed, and the  patient expressed understanding of the given instructions. Patient is instructed to call or message via MyChart if he/she has any questions or concerns regarding our treatment plan. No barriers to understanding were identified. We discussed Red Flag symptoms and signs in detail. Patient expressed understanding regarding what to do in case of urgent or emergency type symptoms.  Medication list was reconciled, printed and provided to the patient in AVS. Patient instructions and summary information was reviewed with the patient as documented in the AVS. This note was prepared with assistance of Dragon voice recognition software. Occasional wrong-word or sound-a-like substitutions may have occurred due to the inherent limitations of voice recognition software  This visit occurred during the SARS-CoV-2 public health emergency.  Safety protocols were in place, including screening questions prior to the visit,  additional usage of staff PPE, and extensive cleaning of exam room while observing appropriate contact time as indicated for disinfecting solutions.

## 2021-09-14 ENCOUNTER — Other Ambulatory Visit: Payer: Self-pay | Admitting: Family Medicine

## 2021-09-14 ENCOUNTER — Ambulatory Visit
Admission: RE | Admit: 2021-09-14 | Discharge: 2021-09-14 | Disposition: A | Discharge status: Home / Self Care | Payer: Medicare Other | Source: Ambulatory Visit | Attending: Family Medicine | Admitting: Family Medicine

## 2021-09-14 DIAGNOSIS — R1310 Dysphagia, unspecified: Secondary | ICD-10-CM

## 2021-10-04 ENCOUNTER — Ambulatory Visit: Payer: Medicare Other

## 2021-10-13 ENCOUNTER — Other Ambulatory Visit: Payer: Self-pay

## 2021-10-13 ENCOUNTER — Ambulatory Visit (INDEPENDENT_AMBULATORY_CARE_PROVIDER_SITE_OTHER): Payer: Medicare Other

## 2021-10-13 ENCOUNTER — Other Ambulatory Visit: Payer: Self-pay | Admitting: Physician Assistant

## 2021-10-13 DIAGNOSIS — Z0000 Encounter for general adult medical examination without abnormal findings: Secondary | ICD-10-CM | POA: Diagnosis not present

## 2021-10-13 DIAGNOSIS — M810 Age-related osteoporosis without current pathological fracture: Secondary | ICD-10-CM

## 2021-10-13 NOTE — Progress Notes (Addendum)
Virtual Visit via Telephone Note  I connected with  Cheryl Austin on 10/13/21 at  1:00 PM EST by telephone and verified that I am speaking with the correct person using two identifiers.  Medicare Annual Wellness visit completed telephonically due to Covid-19 pandemic.   Persons participating in this call: This Health Coach and this patient.   Location: Patient: Home Provider: Office    I discussed the limitations, risks, security and privacy concerns of performing an evaluation and management service by telephone and the availability of in person appointments. The patient expressed understanding and agreed to proceed.  Unable to perform video visit due to video visit attempted and failed and/or patient does not have video capability.   Some vital signs may be absent or patient reported.   Willette Brace, LPN    Subjective:   Cheryl Austin is a 81 y.o. female who presents for Medicare Annual (Subsequent) preventive examination.  Review of Systems     Cardiac Risk Factors include: advanced age (>61men, >34 women)     Objective:    There were no vitals filed for this visit. There is no height or weight on file to calculate BMI.  Advanced Directives 10/13/2021 10/07/2020 03/31/2019 07/18/2016  Does Patient Have a Medical Advance Directive? Yes Yes No Yes  Type of Advance Directive Healthcare Power of [place] in Chart? No - copy requested No - copy requested - -  Would patient like information on creating a medical advance directive? - - No - Patient declined -    Current Medications (verified) Outpatient Encounter Medications as of 10/13/2021  Medication Sig   alendronate (FOSAMAX) 70 MG tablet TAKE 1 TABLET (70 MG TOTAL) BY MOUTH ONCE A WEEK. TAKE WITH A FULL GLASS OF WATER ON AN EMPTY STOMACH.   betamethasone dipropionate 0.05 % cream APPLY TO AFFECTED AREA TWICE A DAY  (Patient taking differently: as needed. APPLY TO AFFECTED AREA TWICE A DAY)   levothyroxine (SYNTHROID) 50 MCG tablet TAKE 1 TABLET BY MOUTH EVERY DAY   LORazepam (ATIVAN) 0.5 MG tablet Take 1-2 tablets by mouth daily as needed.   meloxicam (MOBIC) 15 MG tablet Take 0.5-1 tablets by mouth daily as needed.   mupirocin ointment (BACTROBAN) 2 % Apply to affected area 1-2 times daily   sertraline (ZOLOFT) 50 MG tablet Take 1 tablet (50 mg total) by mouth daily.   traZODone (DESYREL) 100 MG tablet Take 100 mg by mouth as needed for sleep.   azelastine (ASTELIN) 0.1 % nasal spray Place 2 sprays into both nostrils 2 (two) times daily. (Patient not taking: Reported on 10/13/2021)   [DISCONTINUED] acyclovir (ZOVIRAX) 200 MG capsule Take 600 mg by mouth daily.   [DISCONTINUED] HYDROcodone-acetaminophen (NORCO/VICODIN) 5-325 MG tablet Take 1 tablet by mouth every 4 (four) hours as needed.   [DISCONTINUED] meloxicam (MOBIC) 7.5 MG tablet Take 7.5 mg by mouth daily.   [DISCONTINUED] omeprazole (PRILOSEC) 20 MG capsule Take 1 capsule (20 mg total) by mouth daily.   [DISCONTINUED] traMADol (ULTRAM) 50 MG tablet Take by mouth every 6 (six) hours as needed.   No facility-administered encounter medications on file as of 10/13/2021.    Allergies (verified) Patient has no known allergies.   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   By lumbar MRI 06/2019, mild   Thyroid disease    Hypothyroid   Past  Surgical History:  Procedure Laterality Date   APPENDECTOMY  1961   BREAST SURGERY Bilateral 1987 1998   Lumpectomy   EYE SURGERY     TOTAL ABDOMINAL HYSTERECTOMY  1986   BSO/Fibroids   Family History  Problem Relation Age of Onset   Lupus Mother    Asthma Mother    Parkinson's disease Mother    Depression Mother    Heart disease Father    Hyperlipidemia Father    Stroke Father    Alcohol abuse Daughter    Depression Daughter    Social History   Socioeconomic History   Marital status: Widowed    Spouse name: Not on file   Number of children: 1   Years of education: 76   Highest education level: Not on file  Occupational History   Occupation: Retired  Tobacco Use   Smoking status: Never   Smokeless tobacco: Never  Vaping Use   Vaping Use: Never used  Substance and Sexual Activity   Alcohol use: Yes    Comment: couple of times weekly (1 drink)   Drug use: No   Sexual activity: Never    Partners: Male    Birth control/protection: Post-menopausal  Other Topics Concern   Not on file  Social History Narrative   Drinks 2-3 caffeine drinks a day    Social Determinants of Health   Financial Resource Strain: Low Risk    Difficulty of Paying Living Expenses: Not hard at all  Food Insecurity: No Food Insecurity   Worried About Charity fundraiser in the Last Year: Never true   Arboriculturist in the Last Year: Never true  Transportation Needs: No Transportation Needs   Lack of Transportation (Medical): No   Lack of Transportation (Non-Medical): No  Physical Activity: Sufficiently Active   Days of Exercise per Week: 3 days   Minutes of Exercise per Session: 90 min  Stress: Stress Concern Present   Feeling of Stress : To some extent  Social Connections: Socially Isolated   Frequency of Communication with Friends and Family: More than three times a week   Frequency of Social Gatherings with Friends and Family: More than three times a week   Attends Religious  Services: Never   Marine scientist or Organizations: No   Attends Archivist Meetings: Never   Marital Status: Widowed    Tobacco Counseling Counseling given: Not Answered   Clinical Intake:  Pre-visit preparation completed: Yes  Pain : No/denies pain     BMI - recorded: 20.6 Nutritional Status: BMI of 19-24  Normal Nutritional Risks: None Diabetes: No  How often do you need to have someone help you when you read instructions, pamphlets, or other written materials from your doctor or pharmacy?: 1 - Never  Diabetic?no  Interpreter Needed?: No  Information entered by :: Charlott Rakes, LPN   Activities of Daily Living In your present state of health, do you have any difficulty performing the following activities: 10/13/2021  Hearing?  Y  Comment slighty HOH  Vision? N  Difficulty concentrating or making decisions? N  Walking or climbing stairs? N  Dressing or bathing? N  Doing errands, shopping? N  Preparing Food and eating ? N  Using the Toilet? N  In the past six months, have you accidently leaked urine? N  Do you have problems with loss of bowel control? N  Managing your Medications? N  Managing your Finances? N  Housekeeping or managing your Housekeeping? N  Some recent data might be hidden    Patient Care Team: Leamon Arnt, MD as PCP - General (Family Medicine) Lyndee Hensen, PT as Physical Therapist (Physical Therapy)  Indicate any recent Medical Services you may have received from other than [facility] providers in the past year (date may be approximate).     Assessment:   This is a routine wellness examination for Cheryl Austin.  Hearing/Vision screen Hearing Screening - Comments:: Pt HOH at times  Vision Screening - Comments:: Pt follows up with Dr Macarthur Critchley for annual eye exams   Dietary issues and exercise activities discussed:     Goals Addressed             This Visit's Progress    Patient Stated       None at this time         Depression Screen PHQ 2/9 Scores 10/13/2021 07/11/2021 06/08/2021 10/07/2020 04/27/2020 10/23/2019 10/23/2019  PHQ - 2 Score 1 0 - 2 2 1 1   PHQ- 9 Score 1 0 - 4 5 7  -  Exception Documentation - - Patient refusal - - - -    Fall Risk Fall Risk  10/13/2021 07/11/2021 10/07/2020 11/30/2019 10/23/2019  Falls in the past year? 0 0 0 0 0  Number falls in past yr: 0 0 0 0 -  Injury with Fall? 0 0 0 0 -  Follow up Falls prevention discussed - Falls prevention discussed - Falls evaluation completed    FALL RISK PREVENTION PERTAINING TO THE HOME:  Any stairs in or around the home? Yes  If so, are there any without handrails? No  Home free of loose throw rugs in walkways, pet beds, electrical cords, etc? Yes  Adequate lighting in your home to reduce risk of falls? Yes   ASSISTIVE DEVICES UTILIZED TO PREVENT FALLS:  Life alert? No  Use of a cane, walker or w/c? No  Grab bars in the bathroom? No  Shower chair or bench in shower? Yes  Elevated toilet seat or a handicapped toilet? No   TIMED UP AND GO:  Was the test performed? No .   Cognitive Function:     6CIT Screen 10/13/2021 10/07/2020  What Year? 0 points 0 points  What month? 0 points 0 points  What time? 0 points -  Count back from 20 0 points 0 points  Months in reverse 0 points 0 points  Repeat phrase 2 points 0 points  Total Score 2 -    Immunizations Immunization History  Administered Date(s) Administered   DT (Pediatric) 05/21/2008   Fluad Quad(high Dose 65+) 04/27/2020, 06/08/2021   Influenza Split 06/20/2010, 05/14/2011, 06/18/2012, 06/22/2013, 07/02/2014, 07/05/2015, 05/21/2016, 05/29/2018   Influenza, High Dose Seasonal PF 06/18/2012, 06/18/2012, 06/22/2013, 06/22/2013, 07/02/2014, 07/02/2014, 07/05/2015, 07/05/2015, 05/21/2016, 05/21/2016, 08/02/2017, 04/27/2020   Influenza, Seasonal, Injecte, Preservative Fre 05/14/2011   Influenza,inj,Quad PF,6+ Mos 05/21/2016, 05/29/2018   Influenza,trivalent, recombinat, inj,  PF 05/14/2011   Influenza-Unspecified 05/14/2011, 06/18/2012, 06/22/2013, 07/02/2014, 07/05/2015   PFIZER Comirnaty(Gray Top)Covid-19  Tri-Sucrose Vaccine 10/11/2019, 11/04/2019   PFIZER(Purple Top)SARS-COV-2 Vaccination 10/11/2019, 11/04/2019   Pneumococcal Conjugate-13 07/04/2014, 07/04/2014   Pneumococcal Polysaccharide-23 03/20/2005, 06/22/2011   Pneumococcal-Unspecified 03/20/2005, 06/22/2011   Td 05/21/2008   Tdap 05/21/2008, 06/22/2011, 06/19/2013    TDAP status: Up to date  Flu Vaccine status: Up to date  Pneumococcal vaccine status: Up to date  Covid-19 vaccine status: Completed vaccines  Qualifies for Shingles Vaccine? Yes   Zostavax completed No   Shingrix Completed?: No.    Education has been provided regarding the importance of this vaccine. Patient has been advised to call insurance company to determine out of pocket expense if they have not yet received this vaccine. Advised may also receive vaccine at local pharmacy or Health Dept. Verbalized acceptance and understanding.  Screening Tests Health Maintenance  Topic Date Due   Zoster Vaccines- Shingrix (1 of 2) Never done   MAMMOGRAM  08/04/2016   COVID-19 Vaccine (5 - Booster) 12/30/2019   DEXA SCAN  10/26/2022   TETANUS/TDAP  06/20/2023   Pneumonia Vaccine 29+ Years old  Completed   INFLUENZA VACCINE  Completed   Hepatitis C Screening  Completed   HPV VACCINES  Aged Out    Health Maintenance  Health Maintenance Due  Topic Date Due   Zoster Vaccines- Shingrix (1 of 2) Never done   MAMMOGRAM  08/04/2016   COVID-19 Vaccine (5 - Booster) 12/30/2019    Colorectal cancer screening: No longer required.   Mammogram status: Completed 08/05/15 Scheduled for 10/25/21. Repeat every year  Bone Density status: Completed 10/25/20. Results reflect: Bone density results: OSTEOPOROSIS. Repeat every 2 years.  Additional Screening:  Hepatitis C Screening:  Completed 07/06/19  Vision Screening: Recommended annual  ophthalmology exams for early detection of glaucoma and other disorders of the eye. Is the patient up to date with their annual eye exam?  Yes  Who is the provider or what is the name of the office in which the patient attends annual eye exams? Dr Macarthur Critchley  If pt is not established with a provider, would they like to be referred to a provider to establish care? No .   Dental Screening: Recommended annual dental exams for proper oral hygiene  Community Resource Referral / Chronic Care Management: CRR required this visit?  No   CCM required this visit?  No      Plan:     I have personally reviewed and noted the following in the patients chart:   Medical and social history Use of alcohol, tobacco or illicit drugs  Current medications and supplements including opioid prescriptions.  Functional ability and status Nutritional status Physical activity Advanced directives List of other physicians Hospitalizations, surgeries, and ER visits in previous 12 months Vitals Screenings to include cognitive, depression, and falls Referrals and appointments  In addition, I have reviewed and discussed with patient certain preventive protocols, quality metrics, and best practice recommendations. A written personalized care plan for preventive services as well as general preventive health recommendations were provided to patient.     Willette Brace, LPN   [id number]   Nurse Notes: None

## 2021-10-13 NOTE — Patient Instructions (Signed)
Cheryl Austin , Thank you for taking time to come for your Medicare Wellness Visit. I appreciate your ongoing commitment to your health goals. Please review the following plan we discussed and let me know if I can assist you in the future.   Screening recommendations/referrals: Colonoscopy: no longer required  Mammogram: Scheduled 10/25/21 Bone Density: Done 10/25/20 repeat every 2 years  Recommended yearly ophthalmology/optometry visit for glaucoma screening and checkup Recommended yearly dental visit for hygiene and checkup  Vaccinations: Influenza vaccine: Done 06/08/21  Pneumococcal vaccine: Up to date Tdap vaccine: Done 06/19/13 repeat every 10 years  Shingles vaccine: Shingrix discussed. Please contact your pharmacy for coverage information.   Covid-19:Completed 2/21, 11/04/19  Advanced directives: Please bring a copy of your health care power of attorney and living will to the office at your convenience.  Conditions/risks identified: None at this time   Next appointment: Follow up in one year for your annual wellness visit    Preventive Care 65 Years and Older, Female Preventive care refers to lifestyle choices and visits with your health care provider that can promote health and wellness. What does preventive care include? A yearly physical exam. This is also called an annual well check. Dental exams once or twice a year. Routine eye exams. Ask your health care provider how often you should have your eyes checked. Personal lifestyle choices, including: Daily care of your teeth and gums. Regular physical activity. Eating a healthy diet. Avoiding tobacco and drug use. Limiting alcohol use. Practicing safe sex. Taking low-dose aspirin every day. Taking vitamin and mineral supplements as recommended by your health care provider. What happens during an annual well check? The services and screenings done by your health care provider during your annual well check will depend on your  age, overall health, lifestyle risk factors, and family history of disease. Counseling  Your health care provider may ask you questions about your: Alcohol use. Tobacco use. Drug use. Emotional well-being. Home and relationship well-being. Sexual activity. Eating habits. History of falls. Memory and ability to understand (cognition). Work and work Astronomer. Reproductive health. Screening  You may have the following tests or measurements: Height, weight, and BMI. Blood pressure. Lipid and cholesterol levels. These may be checked every 5 years, or more frequently if you are over 63 years old. Skin check. Lung cancer screening. You may have this screening every year starting at age 107 if you have a 30-pack-year history of smoking and currently smoke or have quit within the past 15 years. Fecal occult blood test (FOBT) of the stool. You may have this test every year starting at age 59. Flexible sigmoidoscopy or colonoscopy. You may have a sigmoidoscopy every 5 years or a colonoscopy every 10 years starting at age 64. Hepatitis C blood test. Hepatitis B blood test. Sexually transmitted disease (STD) testing. Diabetes screening. This is done by checking your blood sugar (glucose) after you have not eaten for a while (fasting). You may have this done every 1-3 years. Bone density scan. This is done to screen for osteoporosis. You may have this done starting at age 98. Mammogram. This may be done every 1-2 years. Talk to your health care provider about how often you should have regular mammograms. Talk with your health care provider about your test results, treatment options, and if necessary, the need for more tests. Vaccines  Your health care provider may recommend certain vaccines, such as: Influenza vaccine. This is recommended every year. Tetanus, diphtheria, and acellular pertussis (Tdap, Td) vaccine.  You may need a Td booster every 10 years. Zoster vaccine. You may need this after  age 39. Pneumococcal 13-valent conjugate (PCV13) vaccine. One dose is recommended after age 61. Pneumococcal polysaccharide (PPSV23) vaccine. One dose is recommended after age 33. Talk to your health care provider about which screenings and vaccines you need and how often you need them. This information is not intended to replace advice given to you by your health care provider. Make sure you discuss any questions you have with your health care provider. Document Released: 09/02/2015 Document Revised: 04/25/2016 Document Reviewed: 06/07/2015 Elsevier Interactive Patient Education  2017 [employer] Prevention in the Home Falls can cause injuries. They can happen to people of all ages. There are many things you can do to make your home safe and to help prevent falls. What can I do on the outside of my home? Regularly fix the edges of walkways and driveways and fix any cracks. Remove anything that might make you trip as you walk through a door, such as a raised step or threshold. Trim any bushes or trees on the path to your home. Use bright outdoor lighting. Clear any walking paths of anything that might make someone trip, such as rocks or tools. Regularly check to see if handrails are loose or broken. Make sure that both sides of any steps have handrails. Any raised decks and porches should have guardrails on the edges. Have any leaves, snow, or ice cleared regularly. Use sand or salt on walking paths during winter. Clean up any spills in your garage right away. This includes oil or grease spills. What can I do in the bathroom? Use night lights. Install grab bars by the toilet and in the tub and shower. Do not use towel bars as grab bars. Use non-skid mats or decals in the tub or shower. If you need to sit down in the shower, use a plastic, non-slip stool. Keep the floor dry. Clean up any water that spills on the floor as soon as it happens. Remove soap buildup in the tub or shower  regularly. Attach bath mats securely with double-sided non-slip rug tape. Do not have throw rugs and other things on the floor that can make you trip. What can I do in the bedroom? Use night lights. Make sure that you have a light by your bed that is easy to reach. Do not use any sheets or blankets that are too big for your bed. They should not hang down onto the floor. Have a firm chair that has side arms. You can use this for support while you get dressed. Do not have throw rugs and other things on the floor that can make you trip. What can I do in the kitchen? Clean up any spills right away. Avoid walking on wet floors. Keep items that you use a lot in easy-to-reach places. If you need to reach something above you, use a strong step stool that has a grab bar. Keep electrical cords out of the way. Do not use floor polish or wax that makes floors slippery. If you must use wax, use non-skid floor wax. Do not have throw rugs and other things on the floor that can make you trip. What can I do with my stairs? Do not leave any items on the stairs. Make sure that there are handrails on both sides of the stairs and use them. Fix handrails that are broken or loose. Make sure that handrails are as long as  the stairways. Check any carpeting to make sure that it is firmly attached to the stairs. Fix any carpet that is loose or worn. Avoid having throw rugs at the top or bottom of the stairs. If you do have throw rugs, attach them to the floor with carpet tape. Make sure that you have a light switch at the top of the stairs and the bottom of the stairs. If you do not have them, ask someone to add them for you. What else can I do to help prevent falls? Wear shoes that: Do not have high heels. Have rubber bottoms. Are comfortable and fit you well. Are closed at the toe. Do not wear sandals. If you use a stepladder: Make sure that it is fully opened. Do not climb a closed stepladder. Make sure that  both sides of the stepladder are locked into place. Ask someone to hold it for you, if possible. Clearly mark and make sure that you can see: Any grab bars or handrails. First and last steps. Where the edge of each step is. Use tools that help you move around (mobility aids) if they are needed. These include: Canes. Walkers. Scooters. Crutches. Turn on the lights when you go into a dark area. Replace any light bulbs as soon as they burn out. Set up your furniture so you have a clear path. Avoid moving your furniture around. If any of your floors are uneven, fix them. If there are any pets around you, be aware of where they are. Review your medicines with your doctor. Some medicines can make you feel dizzy. This can increase your chance of falling. Ask your doctor what other things that you can do to help prevent falls. This information is not intended to replace advice given to you by your health care provider. Make sure you discuss any questions you have with your health care provider. Document Released: 06/02/2009 Document Revised: 01/12/2016 Document Reviewed: 09/10/2014 Elsevier Interactive Patient Education  2017 [employer].

## 2021-10-13 NOTE — Telephone Encounter (Signed)
Next Visit: 11/09/2021  Last Visit: 05/11/2021  Last Fill: 04/28/2021  DX: Age-related osteoporosis without current pathological fracture   Current Dose per office note 05/11/2021: She will remain on Fosamax as prescribed.   Labs: 09/13/2021 WBC 3.4,   Okay to refill Fosamax?

## 2021-10-25 ENCOUNTER — Ambulatory Visit: Payer: Medicare Other

## 2021-10-26 NOTE — Progress Notes (Deleted)
? ?Office Visit Note ? ?Patient: Cheryl Austin             ?Date of Birth: 24-Feb-1941           ?MRN: [id number]             ?PCP: Leamon Arnt, MD ?Referring: Leamon Arnt, MD ?Visit Date: 11/09/2021 ?Occupation: @GUAROCC @ ? ?Subjective:  ?No chief complaint on file. ? ? ?History of Present Illness: Tanina Rakoczy is a 81 y.o. female ***  ? ?Activities of Daily Living:  ?Patient reports morning stiffness for *** {minute/hour:19697}.   ?Patient {ACTIONS;DENIES/REPORTS:21021675::"Denies"} nocturnal pain.  ?Difficulty dressing/grooming: {ACTIONS;DENIES/REPORTS:21021675::"Denies"} ?Difficulty climbing stairs: {ACTIONS;DENIES/REPORTS:21021675::"Denies"} ?Difficulty getting out of chair: {ACTIONS;DENIES/REPORTS:21021675::"Denies"} ?Difficulty using hands for taps, buttons, cutlery, and/or writing: {ACTIONS;DENIES/REPORTS:21021675::"Denies"} ? ?No Rheumatology ROS completed.  ? ?PMFS History:  ?Patient Active Problem List  ? Diagnosis Date Noted  ? GAD (generalized anxiety disorder) 10/26/2020  ? 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  ? Polymyalgia rheumatica ([place]) 10/23/2019  ? Spinal stenosis, lumbar region without neurogenic claudication 10/23/2019  ? Lumbar facet arthropathy 10/23/2019  ? 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 Spondylosis of cervical region without myelopathy or radiculopathy 05/29/2018  ? Family history of Parkinson disease 05/21/2016  ? Seborrheic dermatitis 11/02/2014  ? 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 Moderate episode of recurrent major depressive disorder ([place]) 06/22/2013  ? Dermatitis, atopic 01/30/2013  ? Menopausal hot flushes 01/30/2013  ? MRSA cellulitis 01/30/2013  ? 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 Insomnia 03/18/2012  ? Hypothyroidism 12/18/2010  ?  ?Past Medical History:  ?Diagnosis Date  ? Anxiety   ? 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 Family history of Parkinson  disease 05/21/2016  ? Mother  ? GERD (gastroesophageal reflux disease)   ? Kidney failure   ? blood transfusion  ? Moderate episode of recurrent major depressive disorder ([place])   ? Osteoporosis   ? Polymyalgia rheumatica ([place]) 10/23/2019  ? Aug 2020, Dr. Estanislado Pandy  ? Restless legs syndrome (RLS) 08/02/2017  ? Spinal stenosis, lumbar region without neurogenic claudication 10/23/2019  ? By lumbar MRI 06/2019, mild  ? Thyroid disease   ? Hypothyroid  ?  ?Family History  ?Problem Relation Age of Onset  ? Lupus Mother   ? Asthma Mother   ? Parkinson's disease Mother   ? Depression Mother   ? Heart disease Father   ? Hyperlipidemia Father   ? Stroke Father   ? Alcohol abuse Daughter   ? Depression Daughter   ? ?Past Surgical History:  ?Procedure Laterality Date  ? APPENDECTOMY  1961  ? BREAST SURGERY Bilateral 1987 1998  ? Lumpectomy  ? EYE SURGERY    ? TOTAL ABDOMINAL HYSTERECTOMY  1986  ? BSO/Fibroids  ? ?Social History  ? ?Social History Narrative  ? Drinks 2-3 caffeine drinks a day   ? ?Immunization History  ?Administered Date(s) Administered  ? DT (Pediatric) 05/21/2008  ? Fluad Quad(high Dose 65+) 04/27/2020, 06/08/2021  ? Influenza Split 06/20/2010, 05/14/2011, 06/18/2012, 06/22/2013, 07/02/2014, 07/05/2015, 05/21/2016, 05/29/2018  ? Influenza, High Dose Seasonal PF 06/18/2012, 06/18/2012, 06/22/2013, 06/22/2013, 07/02/2014, 07/02/2014, 07/05/2015, 07/05/2015, 05/21/2016, 05/21/2016, 08/02/2017, 04/27/2020  ? Influenza, Seasonal, Injecte, Preservative Fre 05/14/2011  ? Influenza,inj,Quad PF,6+ Mos 05/21/2016, 05/29/2018  ? Influenza,trivalent, recombinat, inj, PF 05/14/2011  ? Influenza-Unspecified 05/14/2011, 06/18/2012, 06/22/2013, 07/02/2014, 07/05/2015  ? PFIZER Comirnaty(Gray Top)Covid-19 Tri-Sucrose Vaccine 10/11/2019, 11/04/2019  ? PFIZER(Purple Top)SARS-COV-2 Vaccination 10/11/2019, 11/04/2019  ?  Pneumococcal Conjugate-13 07/04/2014, 07/04/2014  ? Pneumococcal Polysaccharide-23 03/20/2005, 06/22/2011  ?  Pneumococcal-Unspecified 03/20/2005, 06/22/2011  ? Td 05/21/2008  ? Tdap 05/21/2008, 06/22/2011, 06/19/2013  ?  ? ?Objective: ?Vital Signs: There were no vitals taken for this visit.  ? ?Physical Exam  ? ?Musculoskeletal Exam: *** ? ?CDAI Exam: ?CDAI Score: -- ?Patient Global: --; Provider Global: -- ?Swollen: --; Tender: -- ?Joint Exam 11/09/2021  ? ?No joint exam has been documented for this visit  ? ?There is currently no information documented on the homunculus. Go to the Rheumatology activity and complete the homunculus joint exam. ? ?Investigation: ?No additional findings. ? ?Imaging: ?No results found. ? ?Recent Labs: ?Lab Results  ?Component Value Date  ? WBC 3.4 (L) 09/13/2021  ? HGB 13.5 09/13/2021  ? PLT 232.0 09/13/2021  ? NA 135 09/13/2021  ? K 4.4 09/13/2021  ? CL 100 09/13/2021  ? CO2 28 09/13/2021  ? GLUCOSE 90 09/13/2021  ? BUN 10 09/13/2021  ? CREATININE 0.81 09/13/2021  ? BILITOT 0.6 09/13/2021  ? ALKPHOS 53 09/13/2021  ? AST 24 09/13/2021  ? ALT 16 09/13/2021  ? PROT 6.6 09/13/2021  ? ALBUMIN 4.4 09/13/2021  ? CALCIUM 9.4 09/13/2021  ? GFRAA 95 11/10/2020  ? QFTBGOLDPLUS NEGATIVE 07/06/2019  ? ? ?Speciality Comments: No specialty comments available. ? ?Procedures:  ?No procedures performed ?Allergies: Patient has no known allergies.  ? ?Assessment / Plan:     ?Visit Diagnoses: No diagnosis found. ? ?Orders: ?No orders of the defined types were placed in this encounter. ? ?No orders of the defined types were placed in this encounter. ? ? ?Face-to-face time spent with patient was *** minutes. Greater than 50% of time was spent in counseling and coordination of care. ? ?Follow-Up Instructions: No follow-ups on file. ? ? ?Earnestine Mealing, CMA ? ?Note - This record has been created using [employer].  ?Chart creation errors have been sought, but may not always  ?have been located. Such creation errors do not reflect on  ?the standard of medical care.  ?

## 2021-11-08 NOTE — Progress Notes (Signed)
? ?Office Visit Note ? ?Patient: Cheryl Austin             ?Date of Birth: 01/31/1941           ?MRN: [id number]             ?PCP: [name], MD ?Referring: [name], MD ?Visit Date: 11/17/2021 ?Occupation: @GUAROCC @ ? ?Subjective:  ?Back pain ? ?History of Present Illness: Cheryl Austin is a 81 y.o. female with history of polymyalgia rheumatica, osteoarthritis, degenerative disc disease and osteoporosis.  She denies any muscular weakness or tenderness.  She states she continues to have some discomfort in her hands.  Her back has been causing a lot of discomfort.  She states she is scheduled for radiofrequency ablation.  She has some stiffness in her cervical region.  She has been taking Fosamax on a weekly basis for osteoporosis.  She also takes vitamin D supplement. ? ?Activities of Daily Living:  ?Patient reports morning stiffness for 2 hours.   ?Patient Denies nocturnal pain.  ?Difficulty dressing/grooming: Denies ?Difficulty climbing stairs: Denies ?Difficulty getting out of chair: Denies ?Difficulty using hands for taps, buttons, cutlery, and/or writing: Denies ? ?Review of Systems  ?Constitutional:  Positive for fatigue.  ?HENT:  Negative for mouth dryness.   ?Eyes:  Positive for dryness.  ?Respiratory:  Negative for shortness of breath.   ?Cardiovascular:  Negative for swelling in legs/feet.  ?Gastrointestinal:  Negative for constipation.  ?Endocrine: Negative for increased urination.  ?Genitourinary:  Negative for difficulty urinating.  ?Musculoskeletal:  Positive for morning stiffness.  ?Skin:  Negative for rash.  ?Allergic/Immunologic: Negative for susceptible to infections.  ?Neurological:  Negative for numbness.  ?Hematological:  Negative for bruising/bleeding tendency.  ?Psychiatric/Behavioral:  Negative for sleep disturbance.   ? ?PMFS History:  ?Patient Active Problem List  ? Diagnosis Date Noted  ? GAD (generalized anxiety disorder) 10/26/2020  ? MRSA nasal colonization 10/26/2020   ? 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  ? Polymyalgia rheumatica ([facility]) 10/23/2019  ? Spinal stenosis, lumbar region without neurogenic claudication 10/23/2019  ? Lumbar facet arthropathy 10/23/2019  ? 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 Spondylosis of cervical region without myelopathy or radiculopathy 05/29/2018  ? Family history of Parkinson disease 05/21/2016  ? Seborrheic dermatitis 11/02/2014  ? 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 Moderate episode of recurrent major depressive disorder ([facility]) 06/22/2013  ? Dermatitis, atopic 01/30/2013  ? Menopausal hot flushes 01/30/2013  ? MRSA cellulitis 01/30/2013  ? 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 Insomnia 03/18/2012  ? Hypothyroidism 12/18/2010  ?  ?Past Medical History:  ?Diagnosis Date  ? Anxiety   ? 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 Family history of Parkinson disease 05/21/2016  ? Mother  ? GERD (gastroesophageal reflux disease)   ? Kidney failure   ? blood transfusion  ? Moderate episode of recurrent major depressive disorder ([facility])   ? Osteoporosis   ? Polymyalgia rheumatica ([facility]) 10/23/2019  ? Aug 2020, Dr. 10-18-1977  ? Restless legs syndrome (RLS) 08/02/2017  ? Spinal stenosis, lumbar region without neurogenic claudication 10/23/2019  ? By lumbar MRI 06/2019, mild  ? Thyroid disease   ? Hypothyroid  ?  ?Family History  ?Problem Relation Age of Onset  ? Lupus Mother   ? Asthma Mother   ? Parkinson's disease Mother   ? Depression Mother   ? Heart disease Father   ? Hyperlipidemia Father   ? Stroke Father   ?  Alcohol abuse Daughter   ? Depression Daughter   ? ?Past Surgical History:  ?Procedure Laterality Date  ? APPENDECTOMY  1961  ? BREAST SURGERY Bilateral 1987 1998  ? Lumpectomy  ? EYE SURGERY    ? TOTAL ABDOMINAL HYSTERECTOMY  1986  ? BSO/Fibroids  ? ?Social History  ? ?Social History Narrative  ? Drinks 2-3 caffeine drinks a day   ? ?Immunization History  ?Administered Date(s) Administered  ?  DT (Pediatric) 05/21/2008  ? Fluad Quad(high Dose 65+) 04/27/2020, 06/08/2021  ? Influenza Split 06/20/2010, 05/14/2011, 06/18/2012, 06/22/2013, 07/02/2014, 07/05/2015, 05/21/2016, 05/29/2018  ? Influenza, High Dose Seasonal PF 06/18/2012, 06/18/2012, 06/22/2013, 06/22/2013, 07/02/2014, 07/02/2014, 07/05/2015, 07/05/2015, 05/21/2016, 05/21/2016, 08/02/2017, 04/27/2020  ? Influenza, Seasonal, Injecte, Preservative Fre 05/14/2011  ? Influenza,inj,Quad PF,6+ Mos 05/21/2016, 05/29/2018  ? Influenza,trivalent, recombinat, inj, PF 05/14/2011  ? Influenza-Unspecified 05/14/2011, 06/18/2012, 06/22/2013, 07/02/2014, 07/05/2015  ? PFIZER Comirnaty(Gray Top)Covid-19 Tri-Sucrose Vaccine 10/11/2019, 11/04/2019  ? PFIZER(Purple Top)SARS-COV-2 Vaccination 10/11/2019, 11/04/2019  ? Pneumococcal Conjugate-13 07/04/2014, 07/04/2014  ? Pneumococcal Polysaccharide-23 03/20/2005, 06/22/2011  ? Pneumococcal-Unspecified 03/20/2005, 06/22/2011  ? Td 05/21/2008  ? Tdap 05/21/2008, 06/22/2011, 06/19/2013  ?  ? ?Objective: ?Vital Signs: BP (!) 146/80 (BP Location: Left Arm, Patient Position: Sitting, Cuff Size: Normal)   Pulse 71   Resp 15   Ht 5' 3.5" (1.613 m)   Wt 118 lb (53.5 kg)   BMI 20.57 kg/m?   ? ?Physical Exam ?Vitals and nursing note reviewed.  ?Constitutional:   ?   Appearance: She is well-developed.  ?HENT:  ?   Head: Normocephalic and atraumatic.  ?Eyes:  ?   Conjunctiva/sclera: Conjunctivae normal.  ?Cardiovascular:  ?   Rate and Rhythm: Normal rate and regular rhythm.  ?   Heart sounds: Normal heart sounds.  ?Pulmonary:  ?   Effort: Pulmonary effort is normal.  ?   Breath sounds: Normal breath sounds.  ?Abdominal:  ?   General: Bowel sounds are normal.  ?   Palpations: Abdomen is soft.  ?Musculoskeletal:  ?   Cervical back: Normal range of motion.  ?Lymphadenopathy:  ?   Cervical: No cervical adenopathy.  ?Skin: ?   General: Skin is warm and dry.  ?   Capillary Refill: Capillary refill takes less than 2 seconds.   ?Neurological:  ?   Mental Status: She is alert and oriented to person, place, and time.  ?Psychiatric:     ?   Behavior: Behavior normal.  ?  ? ?Musculoskeletal Exam: She had limited range of motion of her cervical spine.  She had painful limited range of motion of the lumbar spine.  Shoulder joints, elbow joints, wrist joints with good range of motion.  She had bilateral PIP and DIP thickening.  Hip joints and knee joints in good range of motion.  There was no tenderness over ankles or MTPs. ? ?CDAI Exam: ?CDAI Score: -- ?Patient Global: --; Provider Global: -- ?Swollen: 0 ; Tender: 0  ?Joint Exam 11/17/2021  ? ?No joint exam has been documented for this visit  ? ?There is currently no information documented on the homunculus. Go to the Rheumatology activity and complete the homunculus joint exam. ? ?Investigation: ?No additional findings. ? ?Imaging: ?No results found. ? ?Recent Labs: ?Lab Results  ?Component Value Date  ? WBC 3.4 (L) 09/13/2021  ? HGB 13.5 09/13/2021  ? PLT 232.0 09/13/2021  ? NA 135 09/13/2021  ? K 4.4 09/13/2021  ? CL 100 09/13/2021  ? CO2 28 09/13/2021  ? GLUCOSE 90  09/13/2021  ? BUN 10 09/13/2021  ? CREATININE 0.81 09/13/2021  ? BILITOT 0.6 09/13/2021  ? ALKPHOS 53 09/13/2021  ? AST 24 09/13/2021  ? ALT 16 09/13/2021  ? PROT 6.6 09/13/2021  ? ALBUMIN 4.4 09/13/2021  ? CALCIUM 9.4 09/13/2021  ? GFRAA 95 11/10/2020  ? QFTBGOLDPLUS NEGATIVE 07/06/2019  ? ? ?Speciality Comments: Fosamax started 03/022 ? ?Procedures:  ?No procedures performed ?Allergies: Patient has no known allergies.  ? ?Assessment / Plan:     ?Visit Diagnoses: Polymyalgia rheumatica ([facility])-she had no muscular weakness or tenderness.  She had no difficulty getting up from the chair. ? ?High risk medication use - Patient discontinued Methotrexate on her own in August 2021.  She is not currently taking any immunosuppressive agents or prednisone ? ?Primary osteoarthritis of both hands-bilateral PIP and DIP thickening was noted.   No synovitis was noted.  Joint protection was discussed. ? ?Spondylosis of cervical region without myelopathy or radiculopathy-she had limited range of motion of her cervical spine without discomfort. ? ?Spondylosis

## 2021-11-09 ENCOUNTER — Ambulatory Visit: Payer: Medicare Other | Admitting: Rheumatology

## 2021-11-09 DIAGNOSIS — E559 Vitamin D deficiency, unspecified: Secondary | ICD-10-CM

## 2021-11-09 DIAGNOSIS — Z8269 Family history of other diseases of the musculoskeletal system and connective tissue: Secondary | ICD-10-CM

## 2021-11-09 DIAGNOSIS — M19041 Primary osteoarthritis, right hand: Secondary | ICD-10-CM

## 2021-11-09 DIAGNOSIS — M810 Age-related osteoporosis without current pathological fracture: Secondary | ICD-10-CM

## 2021-11-09 DIAGNOSIS — Z820 Family history of epilepsy and other diseases of the nervous system: Secondary | ICD-10-CM

## 2021-11-09 DIAGNOSIS — M353 Polymyalgia rheumatica: Secondary | ICD-10-CM

## 2021-11-09 DIAGNOSIS — Z8659 Personal history of other mental and behavioral disorders: Secondary | ICD-10-CM

## 2021-11-09 DIAGNOSIS — Z79899 Other long term (current) drug therapy: Secondary | ICD-10-CM

## 2021-11-09 DIAGNOSIS — E8809 Other disorders of plasma-protein metabolism, not elsewhere classified: Secondary | ICD-10-CM

## 2021-11-09 DIAGNOSIS — M47812 Spondylosis without myelopathy or radiculopathy, cervical region: Secondary | ICD-10-CM

## 2021-11-09 DIAGNOSIS — Z8639 Personal history of other endocrine, nutritional and metabolic disease: Secondary | ICD-10-CM

## 2021-11-09 DIAGNOSIS — M47816 Spondylosis without myelopathy or radiculopathy, lumbar region: Secondary | ICD-10-CM

## 2021-11-09 DIAGNOSIS — L219 Seborrheic dermatitis, unspecified: Secondary | ICD-10-CM

## 2021-11-09 DIAGNOSIS — G4709 Other insomnia: Secondary | ICD-10-CM

## 2021-11-09 DIAGNOSIS — Z8614 Personal history of Methicillin resistant Staphylococcus aureus infection: Secondary | ICD-10-CM

## 2021-11-10 ENCOUNTER — Ambulatory Visit (INDEPENDENT_AMBULATORY_CARE_PROVIDER_SITE_OTHER): Payer: Medicare Other

## 2021-11-10 ENCOUNTER — Encounter: Payer: Self-pay | Admitting: Family Medicine

## 2021-11-10 ENCOUNTER — Ambulatory Visit: Payer: Medicare Other | Admitting: Family Medicine

## 2021-11-10 VITALS — BP 135/85 | HR 87 | Temp 97.8°F | Ht 63.0 in | Wt 118.8 lb

## 2021-11-10 DIAGNOSIS — R002 Palpitations: Secondary | ICD-10-CM

## 2021-11-10 DIAGNOSIS — M48061 Spinal stenosis, lumbar region without neurogenic claudication: Secondary | ICD-10-CM | POA: Diagnosis not present

## 2021-11-10 DIAGNOSIS — R3589 Other polyuria: Secondary | ICD-10-CM | POA: Diagnosis not present

## 2021-11-10 DIAGNOSIS — R42 Dizziness and giddiness: Secondary | ICD-10-CM | POA: Diagnosis not present

## 2021-11-10 DIAGNOSIS — H9113 Presbycusis, bilateral: Secondary | ICD-10-CM

## 2021-11-10 DIAGNOSIS — H9313 Tinnitus, bilateral: Secondary | ICD-10-CM

## 2021-11-10 LAB — URINALYSIS, ROUTINE W REFLEX MICROSCOPIC
Bilirubin Urine: NEGATIVE
Hgb urine dipstick: NEGATIVE
Ketones, ur: NEGATIVE
Leukocytes,Ua: NEGATIVE
Nitrite: NEGATIVE
RBC / HPF: NONE SEEN (ref 0–?)
Specific Gravity, Urine: <=1.005 — AB (ref 1.000–1.030)
Total Protein, Urine: NEGATIVE
Urine Glucose: NEGATIVE
Urobilinogen, UA: 0.2 (ref 0.0–1.0)
pH: 5.5 (ref 5.0–8.0)

## 2021-11-10 MED ORDER — SERTRALINE HCL 50 MG PO TABS: 75.0000 mg | ORAL_TABLET | Freq: Every day | ORAL | 3 refills | Status: DC

## 2021-11-10 NOTE — Patient Instructions (Signed)
Please return in 3 months to recheck mood ? ?Increase your zoloft to 75mg  daily. This should help with managing your stress levels.  ?We will set you up to wear a heart monitor for 28 hours.  ?After that, please decrease your caffeine intake and keep well hydrated.  ? ?Your EKG today looks stable.  ? ?If you have any questions or concerns, please don't hesitate to send me a message via MyChart or call the office at [phone]. Thank you for visiting with [place] today! It's our pleasure caring for you.  ? ?Palpitations ?Palpitations are feelings that your heartbeat is irregular or is faster than normal. It may feel like your heart is fluttering or skipping a beat. Palpitations may be caused by many things, including smoking, caffeine, alcohol, stress, and certain medicines or drugs. Most causes of palpitations are not serious.  ?However, some palpitations can be a sign of a serious problem. Further tests and a thorough medical history will be done to find the cause of your palpitations. Your provider may order tests such as an ECG, labs, an echocardiogram, or an ambulatory continuous ECG monitor. ?Follow these instructions at home: ?Pay attention to any changes in your symptoms. Let your health care provider know about them. Take these actions to help manage your symptoms: ?Eating and drinking ?Follow instructions from your health care provider about eating or drinking restrictions. You may need to avoid foods and drinks that may cause palpitations. These may include: ?Caffeinated coffee, tea, soft drinks, and energy drinks. ?Chocolate. ?Alcohol. ?Diet pills. ?Lifestyle ?  ?Take steps to reduce your stress and anxiety. Things that can help you relax include: ?Yoga. ?Mind-body activities, such as deep breathing, meditation, or using words and images to create positive thoughts (guided imagery). ?Physical activity, such as swimming, jogging, or walking. Tell your health care provider if your palpitations increase with  activity. If you have chest pain or shortness of breath with activity, do not continue the activity until you are seen by your health care provider. ?Biofeedback. This is a method that helps you learn to use your mind to control things in your body, such as your heartbeat. ?Get plenty of rest and sleep. Keep a regular bed time. ?Do not use drugs, including cocaine or ecstasy. Do not use marijuana. ?Do not use any products that contain nicotine or tobacco. These products include cigarettes, chewing tobacco, and vaping devices, such as e-cigarettes. If you need help quitting, ask your health care provider. ?General instructions ?Take over-the-counter and prescription medicines only as told by your health care provider. ?Keep all follow-up visits. This is important. These may include visits for further testing if palpitations do not go away or get worse. ?Contact a health care provider if: ?You continue to have a fast or irregular heartbeat for a long period of time. ?You notice that your palpitations occur more often. ?Get help right away if: ?You have chest pain or shortness of breath. ?You have a severe headache. ?You feel dizzy or you faint. ?These symptoms may represent a serious problem that is an emergency. Do not wait to see if the symptoms will go away. Get medical help right away. Call your local emergency services (911 in the [place] Do not drive yourself to the hospital. ?Summary ?Palpitations are feelings that your heartbeat is irregular or is faster than normal. It may feel like your heart is fluttering or skipping a beat. ?Palpitations may be caused by many things, including smoking, caffeine, alcohol, stress, certain medicines, and drugs. ?  Further tests and a thorough medical history may be done to find the cause of your palpitations. ?Get help right away if you faint or have chest pain, shortness of breath, severe headache, or dizziness. ?This information is not intended to replace advice given to you  by your health care provider. Make sure you discuss any questions you have with your health care provider. ?Document Revised: 12/28/2020 Document Reviewed: 12/28/2020 ?Elsevier Patient Education ? 2022 [place]. ? ? ?

## 2021-11-10 NOTE — Progress Notes (Unsigned)
Enrolled patient for a 2-3 day Zio XT monitor to be mailed to patients home ?

## 2021-11-10 NOTE — Progress Notes (Signed)
NSR, similar to 2019 ekg, inverted lateral T waves. No acute changes

## 2021-11-13 DIAGNOSIS — R42 Dizziness and giddiness: Secondary | ICD-10-CM | POA: Diagnosis not present

## 2021-11-13 DIAGNOSIS — R002 Palpitations: Secondary | ICD-10-CM | POA: Diagnosis not present

## 2021-11-17 ENCOUNTER — Ambulatory Visit: Payer: Medicare Other | Admitting: Rheumatology

## 2021-11-17 ENCOUNTER — Encounter: Payer: Self-pay | Admitting: Rheumatology

## 2021-11-17 VITALS — BP 146/80 | HR 71 | Resp 15 | Ht 63.5 in | Wt 118.0 lb

## 2021-11-17 DIAGNOSIS — Z8269 Family history of other diseases of the musculoskeletal system and connective tissue: Secondary | ICD-10-CM

## 2021-11-17 DIAGNOSIS — Z8639 Personal history of other endocrine, nutritional and metabolic disease: Secondary | ICD-10-CM

## 2021-11-17 DIAGNOSIS — M47812 Spondylosis without myelopathy or radiculopathy, cervical region: Secondary | ICD-10-CM | POA: Diagnosis not present

## 2021-11-17 DIAGNOSIS — M810 Age-related osteoporosis without current pathological fracture: Secondary | ICD-10-CM

## 2021-11-17 DIAGNOSIS — M19041 Primary osteoarthritis, right hand: Secondary | ICD-10-CM | POA: Diagnosis not present

## 2021-11-17 DIAGNOSIS — E8809 Other disorders of plasma-protein metabolism, not elsewhere classified: Secondary | ICD-10-CM

## 2021-11-17 DIAGNOSIS — Z8614 Personal history of Methicillin resistant Staphylococcus aureus infection: Secondary | ICD-10-CM

## 2021-11-17 DIAGNOSIS — Z79899 Other long term (current) drug therapy: Secondary | ICD-10-CM

## 2021-11-17 DIAGNOSIS — M47816 Spondylosis without myelopathy or radiculopathy, lumbar region: Secondary | ICD-10-CM

## 2021-11-17 DIAGNOSIS — Z820 Family history of epilepsy and other diseases of the nervous system: Secondary | ICD-10-CM

## 2021-11-17 DIAGNOSIS — M353 Polymyalgia rheumatica: Secondary | ICD-10-CM

## 2021-11-17 DIAGNOSIS — L219 Seborrheic dermatitis, unspecified: Secondary | ICD-10-CM

## 2021-11-17 DIAGNOSIS — Z8659 Personal history of other mental and behavioral disorders: Secondary | ICD-10-CM

## 2021-11-17 DIAGNOSIS — M19042 Primary osteoarthritis, left hand: Secondary | ICD-10-CM

## 2021-11-17 DIAGNOSIS — G4709 Other insomnia: Secondary | ICD-10-CM

## 2021-11-17 DIAGNOSIS — E559 Vitamin D deficiency, unspecified: Secondary | ICD-10-CM

## 2021-11-27 NOTE — Progress Notes (Signed)
? ?Subjective  ?CC:  ?Chief Complaint  ?Patient presents with  ? Dizziness  ?  Pt stated that she has been experiencing some dizzy spells for the past 3 months with buzzing in the ears, low energy and on/off with heart palpitations Pt also c/o urination a lot over the last month with no burning.   ? ? ?HPI: Cheryl Austin is a 81 y.o. female who presents to the office today to address the problems listed above in the chief complaint. ?81 yo with multiple concerns as listed above.  C/o palpitations w/o sxs of angina. Worsening over the last 3 months.  ?Also w/ polyuria and ringing of the ears ?Has chronic low back pain.  ? ? ?Lab Results  ?Component Value Date  ? CHOL 196 09/13/2021  ? HDL 87.20 09/13/2021  ? LDLCALC 98 09/13/2021  ? TRIG 52.0 09/13/2021  ? CHOLHDL 2 09/13/2021  ? ?Lab Results  ?Component Value Date  ? CREATININE 0.81 09/13/2021  ? BUN 10 09/13/2021  ? NA 135 09/13/2021  ? K 4.4 09/13/2021  ? CL 100 09/13/2021  ? CO2 28 09/13/2021  ? ?EKG: NSR w/o acute changes. Inverted t waves ? ?Assessment  ?1. Palpitations   ?2. Lightheadedness   ?3. Tinnitus of both ears   ?4. Spinal stenosis, lumbar region without neurogenic claudication   ?5. Presbycusis of both ears   ?6. Polyuria   ? ?  ?Plan  ?Palpitations w/ lightheadedness:  check holter and echo.  ?R/o uti ?Close f/u ? ?Follow up: No follow-ups on file.  ?02/12/2022 ? ?Orders Placed This Encounter  ?Procedures  ? Urinalysis, Routine w reflex microscopic  ? LONG TERM MONITOR (3-14 DAYS)  ? EKG 12-Lead  ? ECHOCARDIOGRAM COMPLETE  ? ?Meds ordered this encounter  ?Medications  ? sertraline (ZOLOFT) 50 MG tablet  ?  Sig: Take 1.5 tablets (75 mg total) by mouth daily.  ?  Dispense:  90 tablet  ?  Refill:  3  ? ?  ? ?I reviewed the patients updated PMH, FH, and SocHx.  ?  ?Patient Active Problem List  ? Diagnosis Date Noted  ? Polymyalgia rheumatica (HCC) 10/23/2019  ?  Priority: High  ? Seborrheic dermatitis 11/02/2014  ?  Priority: High  ? Moderate episode  of recurrent major depressive disorder (HCC) 06/22/2013  ?  Priority: High  ? MRSA cellulitis 01/30/2013  ?  Priority: High  ? Hypothyroidism 12/18/2010  ?  Priority: High  ? 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  Priority: Medium   ? Spinal stenosis, lumbar region without neurogenic claudication 10/23/2019  ?  Priority: Medium   ? Lumbar facet arthropathy 10/23/2019  ?  Priority: Medium   ? Spondylosis of cervical region without myelopathy or radiculopathy 05/29/2018  ?  Priority: Medium   ? 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  Priority: Medium   ? Dermatitis, atopic 01/30/2013  ?  Priority: Medium   ? 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  Priority: Medium   ? Insomnia 03/18/2012  ?  Priority: Medium   ? GAD (generalized anxiety disorder) 10/26/2020  ?  Priority: Low  ? 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  ?  Priority: Low  ? Family history of Parkinson disease 05/21/2016  ?  Priority: Low  ? Menopausal hot flushes 01/30/2013  ?  Priority: Low  ? 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 ?Current Meds  ?Medication Sig  ? alendronate (FOSAMAX) 70 MG tablet TAKE 1 TABLET ONCE A WEEK. TAKE WITH  A FULL GLASS OF WATER ON AN EMPTY STOMACH.  ? betamethasone dipropionate 0.05 % cream APPLY TO AFFECTED AREA TWICE A DAY (Patient taking differently: as needed. APPLY TO AFFECTED AREA TWICE A DAY)  ? levothyroxine (SYNTHROID) 50 MCG tablet TAKE 1 TABLET BY MOUTH EVERY DAY  ? LORazepam (ATIVAN) 0.5 MG tablet Take 1-2 tablets by mouth daily as needed.  ? meloxicam (MOBIC) 15 MG tablet Take 0.5-1 tablets by mouth daily as needed.  ? traZODone (DESYREL) 100 MG tablet Take 100 mg by mouth as needed for sleep.  ? [DISCONTINUED] azelastine (ASTELIN) 0.1 % nasal spray Place 2 sprays into both nostrils 2 (two) times daily. (Patient not taking: Reported on 11/17/2021)  ? [DISCONTINUED] mupirocin ointment (BACTROBAN) 2 % Apply to affected area 1-2 times daily (Patient not taking: Reported on 11/17/2021)  ? [DISCONTINUED]  sertraline (ZOLOFT) 50 MG tablet Take 1 tablet (50 mg total) by mouth daily.  ? ? ?Allergies: ?Patient has No Known Allergies. ?Family History: ?Patient family history includes Alcohol abuse in her daughter; Asthma in her mother; Depression in her daughter and mother; Heart disease in her father; Hyperlipidemia in her father; Lupus in her mother; Parkinson's disease in her mother; Stroke in her father. ?Social History:  ?Patient  reports that she has never smoked. She has never used smokeless tobacco. She reports current alcohol use of about 2.0 standard drinks per week. She reports that she does not use drugs. ? ?Review of Systems: ?Constitutional: Negative for fever malaise or anorexia ?Cardiovascular: negative for chest pain ?Respiratory: negative for SOB or persistent cough ?Gastrointestinal: negative for abdominal pain ? ?Objective  ?Vitals: BP 135/85   Pulse 87   Temp 97.8 ?F (36.6 ?C)   Ht 5\' 3"  (1.6 m)   Wt 118 lb 12.8 oz (53.9 kg)   SpO2 96%   BMI 21.04 kg/m?  ?General: no acute distress , A&Ox3 ?HEENT: PEERL, conjunctiva normal, neck is supple ?Cardiovascular:  RRR without murmur or gallop.  ?Respiratory:  Good breath sounds bilaterally, CTAB with normal respiratory effort ?Skin:  Warm, no rashes ? ? ? ?Commons side effects, risks, benefits, and alternatives for medications and treatment plan prescribed today were discussed, and the patient expressed understanding of the given instructions. Patient is instructed to call or message via MyChart if he/she has any questions or concerns regarding our treatment plan. No barriers to understanding were identified. We discussed Red Flag symptoms and signs in detail. Patient expressed understanding regarding what to do in case of urgent or emergency type symptoms.  ?Medication list was reconciled, printed and provided to the patient in AVS. Patient instructions and summary information was reviewed with the patient as documented in the AVS. ?This note was  prepared with assistance of . Occasional wrong-word or sound-a-like substitutions may have occurred due to the inherent limitations of voice recognition software ? ?This visit occurred during the SARS-CoV-2 public health emergency.  Safety protocols were in place, including screening questions prior to the visit, additional usage of staff PPE, and extensive cleaning of exam room while observing appropriate contact time as indicated for disinfecting solutions.  ? ? ?

## 2021-12-11 ENCOUNTER — Other Ambulatory Visit: Payer: Self-pay | Admitting: Family Medicine

## 2021-12-14 ENCOUNTER — Ambulatory Visit (HOSPITAL_COMMUNITY): Payer: Medicare Other | Attending: Cardiology

## 2021-12-14 DIAGNOSIS — R002 Palpitations: Secondary | ICD-10-CM | POA: Diagnosis not present

## 2021-12-14 DIAGNOSIS — E039 Hypothyroidism, unspecified: Secondary | ICD-10-CM | POA: Diagnosis not present

## 2021-12-14 DIAGNOSIS — R42 Dizziness and giddiness: Secondary | ICD-10-CM | POA: Diagnosis present

## 2021-12-14 DIAGNOSIS — R55 Syncope and collapse: Secondary | ICD-10-CM

## 2021-12-14 DIAGNOSIS — I340 Nonrheumatic mitral (valve) insufficiency: Secondary | ICD-10-CM | POA: Insufficient documentation

## 2021-12-14 LAB — ECHOCARDIOGRAM COMPLETE
Area-P 1/2: 3.99 cm2
MV M vel: 5.61 m/s
MV Peak grad: 125.7 mmHg
S' Lateral: 2.8 cm

## 2022-01-04 ENCOUNTER — Ambulatory Visit: Payer: Medicare Other | Admitting: Family Medicine

## 2022-01-08 ENCOUNTER — Ambulatory Visit: Payer: Medicare Other | Admitting: Family Medicine

## 2022-01-09 ENCOUNTER — Ambulatory Visit: Payer: Medicare Other | Admitting: Family Medicine

## 2022-01-11 ENCOUNTER — Encounter: Payer: Self-pay | Admitting: Family Medicine

## 2022-01-11 ENCOUNTER — Ambulatory Visit: Payer: Medicare Other | Admitting: Family Medicine

## 2022-01-11 VITALS — BP 120/72 | HR 64 | Temp 97.9°F | Ht 63.5 in | Wt 117.8 lb

## 2022-01-11 DIAGNOSIS — F331 Major depressive disorder, recurrent, moderate: Secondary | ICD-10-CM | POA: Diagnosis not present

## 2022-01-11 DIAGNOSIS — I471 Supraventricular tachycardia: Secondary | ICD-10-CM

## 2022-01-11 DIAGNOSIS — M48061 Spinal stenosis, lumbar region without neurogenic claudication: Secondary | ICD-10-CM

## 2022-01-11 DIAGNOSIS — R002 Palpitations: Secondary | ICD-10-CM | POA: Diagnosis not present

## 2022-01-11 MED ORDER — MELOXICAM 15 MG PO TABS: 15.0000 mg | ORAL_TABLET | Freq: Every day | ORAL | 2 refills | Status: DC | PRN

## 2022-01-11 MED ORDER — METOPROLOL SUCCINATE ER 25 MG PO TB24: 25.0000 mg | ORAL_TABLET | Freq: Every day | ORAL | 3 refills | Status: DC | PRN

## 2022-01-11 NOTE — Patient Instructions (Signed)
Please return in 3 months for recheck  Increase your zoloft to 75mg  daily; then 100mg  if needed.  Start using the metoprolol as needed for heart fluttering.  If you have any questions or concerns, please don't hesitate to send me a message via MyChart or call the office at [phone]. Thank you for visiting with [place] today! It's our pleasure caring for you.   Supraventricular Tachycardia, Adult Supraventricular tachycardia (SVT) is a type of abnormal heart rhythm. It causes the heart to beat very quickly. SVT can start suddenly and last for a short time, which is called paroxysmal SVT, or it may last longer and require specialized treatment to return the heart rhythm to normal. A normal resting heart rate is 60-100 beats per minute. During an episode of SVT, your heart rate may be higher than 150 beats per minute. Episodes of SVT can be frightening, but they are usually not dangerous. However, if episodes happen several times a day or last longer than a few seconds, they may lead to heart failure. What are the causes?  Usually, a normal heartbeat starts when an area called the sinoatrial node releases an electrical signal. In SVT, other areas of the heart send out electrical signals that interfere with the signal from the sinoatrial node. The cause of this abnormal electrical activity is not known. What increases the risk? You are more likely to develop this condition if you are: Middle aged or younger. Female. The following factors may also make you more likely to develop this condition: Stress or anxiety. Tiredness. Smoking. Stimulant drugs, such as cocaine and methamphetamine. Alcohol. Caffeine. Pregnancy. Having any of these conditions: A thyroid condition. Diabetes mellitus. Obstructive sleep apnea. What are the signs or symptoms? Symptoms of this condition include: A pounding heart. A feeling that the heart is skipping beats (palpitations). Weakness. Shortness of  breath. Tightness or pain in your chest. Light-headedness or dizziness. Anxiety. Sweating. Nausea. Fainting. Fatigue or tiredness. A mild episode may not cause symptoms. How is this diagnosed? This condition may be diagnosed based on: Your symptoms. A physical exam. If you have an episode of SVT during the exam, the health care provider may be able to diagnose SVT by listening to your heart and feeling your pulse. Tests. These may include: An electrocardiogram (ECG). This test is done to check for problems with electrical activity in the heart. A Holter monitor or event monitor test. This test involves wearing a portable device that monitors your heart rate over time. An echocardiogram. This test involves taking an image of your heart using sound waves. It is done to rule out other causes of a fast heart rate. A stress echocardiogram. This test involves doing an echocardiogram when you are at rest and after exercise. Blood tests. An electrophysiology study (EPS). This tests the electrical activity in your heart to find where the abnormal heart rhythm is coming from using cardiac catheters. How is this treated? This condition may be treated with: Vagal nerve stimulation. This involves stimulating your vagus nerve, which is a nerve that runs from the chest, through the neck, to the lower part of the brain. Stimulating this nerve can slow down the heart. It is often the first and only treatment that is needed for this condition. Work with your health care provider to find which technique works best for you. Ways to do this treatment include: Laying on your back, then holding your breath and pushing, as though you are having a bowel movement. Massaging an area  on one side of your neck, below your jaw. Do not try this yourself. Only a health care provider should do this. If done the wrong way, it can lead to a stroke. Bending forward with your head between your legs. Coughing while bending  forward with your head between your legs. Applying an ice-cold, wet towel to your face. Medicines that prevent attacks. Medicine to stop an attack. The medicine is given through an IV at the hospital. A small electric shock (cardioversion) that stops an attack. Before you get the shock, you will get medicine to make you fall asleep. Radiofrequency ablation. In this procedure, a small, thin tube (catheter) is used to send radiofrequency energy to the area of tissue that is causing the rapid heartbeats. The energy kills the cells and helps your heart keep a normal rhythm. You may have this treatment if you have symptoms of SVT often. If you do not have symptoms, you may not need treatment. Follow these instructions at home: Stress Avoid stressful situations when possible. Find healthy ways of managing stress, such as: Taking part in relaxing activities, such as yoga, meditation, or being out in nature. Listening to relaxing music. Practicing relaxation techniques, such as deep breathing. Leading a healthy lifestyle. This involves getting plenty of sleep, exercising, and eating a balanced diet. Attending counseling or talk therapy with a mental health professional. Lifestyle  Try to get at least 7 hours of sleep each night. Do not use any products that contain nicotine or tobacco. These products include cigarettes, chewing tobacco, and vaping devices, such as e-cigarettes. If you need help quitting, ask your health care provider. Do not drink alcohol if it triggers episodes of SVT. If alcohol does not seem to trigger episodes, limit your alcohol intake. If you drink alcohol: Limit how much you have to: 0-1 drink a day for women who are not pregnant. 0-2 drinks a day for men. Know how much alcohol is in your drink. In the [place] one drink equals one 12 oz bottle of beer (355 mL), one 5 oz glass of wine (148 mL), or one 1 oz glass of hard liquor (44 mL). Be aware of how caffeine affects your  condition. If caffeine: Triggers episodes of SVT, do not eat, drink, or use anything with caffeine in it. Does not seem to trigger episodes, consume caffeine in moderation. Do not use stimulant drugs. If you need help quitting, talk with your health care provider. General instructions Maintain a healthy weight. Exercise regularly. Ask your health care provider to suggest some good activities for you. Aim for one or a combination of the following: 150 minutes per week of moderate exercise, such as walking or yoga. 75 minutes per week of vigorous exercise, such as running or swimming. Perform vagus nerve stimulation as directed by your health care provider. Take over-the-counter and prescription medicines only as told by your health care provider. Keep all follow-up visits. This is important. Contact a health care provider if: You have episodes of SVT more often than before. Episodes of SVT last longer than before. Vagus nerve stimulation is no longer helping. You have new symptoms. Get help right away if: You have chest pain. Your symptoms get worse. You have trouble breathing. You have an episode of SVT that lasts longer than 20 minutes. You faint. These symptoms may represent a serious problem that is an emergency. Do not wait to see if the symptoms will go away. Get medical help right away. Call your local emergency services (  911 in the [place] Do not drive yourself to the hospital. Summary Supraventricular tachycardia (SVT) is a type of abnormal heart rhythm. During an episode of SVT, your heart rate may be higher than 150 beats per minute. If you do not have symptoms, you may not need treatment. This information is not intended to replace advice given to you by your health care provider. Make sure you discuss any questions you have with your health care provider. Document Revised: 03/19/2020 Document Reviewed: 03/19/2020 Elsevier Patient Education  [place].

## 2022-01-11 NOTE — Progress Notes (Signed)
Subjective  CC:  Chief Complaint  Patient presents with   heart fluttering    Pt stated that she has been dealing with heart fluttering for the past year and it had gotten worse.    HPI: Cheryl Austin is a 81 y.o. female who presents to the office today to address the problems listed above in the chief complaint. Follow-up mood: On Zoloft.  Continues to have stressful life.  See last note.  Recommended increasing Zoloft dose but she decided not to do that at that time.  No clear reasons.  Still understands stressors and anxieties are affecting the way she feels including increasing her heart fluttering Palpitations: Persistent.  Will last for moments to minutes.  She had a 3-day heart monitor that showed 2 runs of 9 beat SVT but otherwise normal.  However she reports that she had very few symptoms that week.  She has no new associated symptoms.  No chest pain, shortness of breath, lightheadedness or diaphoresis.  She does find it bothersome Chronic back pain spinal stenosis: Fortunately nerve ablation did not help her pain.  She is going to go see Dr. Abigail Miyamoto to discuss epidural steroid injection.  She is requesting meloxicam refill for pain.  Assessment  1. Moderate episode of recurrent major depressive disorder (HCC)   2. Spinal stenosis, lumbar region without neurogenic claudication   3. Palpitations   4. Paroxysmal SVT (supraventricular tachycardia) (HCC)      Plan  Depression/anxiety reaction: Recommend increasing Zoloft to 75 mg daily.  Can consider increasing to 100 mg daily if not yet effective enough.  Patient agrees Spinal stenosis: Meloxicam and follow-up with Ortho Palpitations due to PSVT most likely: Symptomatic.  Start metoprolol XL 25 daily as needed.  Education given.  Follow up: 3 months to recheck PSVT and mood. Visit date not found  No orders of the defined types were placed in this encounter.  Meds ordered this encounter  Medications   meloxicam (MOBIC) 15 MG  tablet    Sig: Take 1 tablet (15 mg total) by mouth daily as needed.    Dispense:  30 tablet    Refill:  2   metoprolol succinate (TOPROL-XL) 25 MG 24 hr tablet    Sig: Take 1 tablet (25 mg total) by mouth daily as needed.    Dispense:  30 tablet    Refill:  3      I reviewed the patients updated PMH, FH, and SocHx.    Patient Active Problem List   Diagnosis Date Noted   Paroxysmal SVT (supraventricular tachycardia) (HCC) 01/11/2022    Priority: High   Polymyalgia rheumatica (HCC) 10/23/2019    Priority: High   Seborrheic dermatitis 11/02/2014    Priority: High   Moderate episode of recurrent major depressive disorder (HCC) 06/22/2013    Priority: High   MRSA cellulitis 01/30/2013    Priority: High   Hypothyroidism 12/18/2010    Priority: High   MRSA nasal colonization 10/26/2020    Priority: Medium    Spinal stenosis, lumbar region without neurogenic claudication 10/23/2019    Priority: Medium    Lumbar facet arthropathy 10/23/2019    Priority: Medium    Spondylosis of cervical region without myelopathy or radiculopathy 05/29/2018    Priority: Medium    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    Priority: Medium    Dermatitis, atopic 01/30/2013    Priority: Medium    Osteoarthritis, 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 TAKE WITH A FULL GLASS OF WATER ON AN EMPTY STOMACH.   betamethasone dipropionate 0.05 % cream APPLY TO AFFECTED AREA TWICE A DAY (Patient taking differently: as needed. APPLY TO AFFECTED AREA TWICE A DAY)   diazepam (VALIUM)  5 MG tablet SMARTSIG:1-2 Tablet(s) By Mouth   levothyroxine (SYNTHROID) 50 MCG tablet TAKE 1 TABLET BY MOUTH EVERY DAY   LORazepam (ATIVAN) 0.5 MG tablet Take 1-2 tablets by mouth daily as needed.   metoprolol succinate (TOPROL-XL) 25 MG 24 hr tablet Take 1 tablet (25 mg total) by mouth daily as needed.   sertraline (ZOLOFT) 50 MG tablet Take 1.5 tablets (75 mg total) by mouth daily.   traZODone (DESYREL) 100 MG tablet Take 100 mg by mouth as needed for sleep.   [DISCONTINUED] meloxicam (MOBIC) 15 MG tablet Take 0.5-1 tablets by mouth daily as needed.    Allergies: Patient has No Known Allergies. Family History: Patient family history includes Alcohol abuse in her daughter; Asthma in her mother; Depression in her daughter and mother; Heart disease in her father; Hyperlipidemia in her father; Lupus in her mother; Parkinson's disease in her mother; Stroke in her father. Social History:  Patient  reports that she has never smoked. She has never used smokeless tobacco. She reports current alcohol use of about 2.0 standard drinks per week. She reports that she does not use drugs.  Review of Systems: Constitutional: Negative for fever malaise or anorexia Cardiovascular: negative for chest pain Respiratory: negative for SOB or persistent cough Gastrointestinal: negative for abdominal pain  Objective  Vitals: BP 120/72   Pulse 64   Temp 97.9 F (36.6 C)   Ht 5' 3.5" (1.613 m)   Wt 117 lb 12.8 oz (53.4 kg)   SpO2 98%   BMI 20.54 kg/m  General: no acute distress , A&Ox3, appears tired Cardiovascular:  RRR without murmur or gallop.  Respiratory:  Good breath sounds bilaterally, CTAB with normal respiratory effort Skin:  Warm, no rashes    Commons side effects, risks, benefits, and alternatives for medications and treatment plan prescribed today were discussed, and the patient expressed understanding of the given instructions. Patient is instructed to call or message via MyChart if he/she  has any questions or concerns regarding our treatment plan. No barriers to understanding were identified. We discussed Red Flag symptoms and signs in detail. Patient expressed understanding regarding what to do in case of urgent or emergency type symptoms.  Medication list was reconciled, printed and provided to the patient in AVS. Patient instructions and summary information was reviewed with the patient as documented in the AVS. This note was prepared with assistance of Dragon voice recognition software. Occasional wrong-word or sound-a-like substitutions may have occurred due to the inherent limitations of voice recognition software  This visit occurred during the SARS-CoV-2 public health emergency.  Safety protocols were in place, including screening questions prior to the visit, additional usage of staff PPE, and extensive cleaning of exam room while observing appropriate contact time as indicated for disinfecting solutions.

## 2022-01-12 ENCOUNTER — Ambulatory Visit: Payer: Medicare Other | Admitting: Family Medicine

## 2022-01-18 ENCOUNTER — Ambulatory Visit: Payer: Medicare Other | Admitting: Family Medicine

## 2022-01-22 ENCOUNTER — Ambulatory Visit: Payer: Medicare Other | Admitting: Family Medicine

## 2022-01-22 ENCOUNTER — Encounter: Payer: Self-pay | Admitting: Family Medicine

## 2022-01-22 VITALS — BP 112/74 | HR 71 | Temp 98.7°F | Ht 63.5 in | Wt 115.8 lb

## 2022-01-22 DIAGNOSIS — R1902 Left upper quadrant abdominal swelling, mass and lump: Secondary | ICD-10-CM

## 2022-01-22 DIAGNOSIS — M353 Polymyalgia rheumatica: Secondary | ICD-10-CM

## 2022-01-22 DIAGNOSIS — M48061 Spinal stenosis, lumbar region without neurogenic claudication: Secondary | ICD-10-CM | POA: Diagnosis not present

## 2022-01-22 DIAGNOSIS — F411 Generalized anxiety disorder: Secondary | ICD-10-CM

## 2022-01-22 DIAGNOSIS — R5383 Other fatigue: Secondary | ICD-10-CM

## 2022-01-22 DIAGNOSIS — R052 Subacute cough: Secondary | ICD-10-CM

## 2022-01-22 DIAGNOSIS — R1012 Left upper quadrant pain: Secondary | ICD-10-CM

## 2022-01-22 LAB — CBC WITH DIFFERENTIAL/PLATELET
Basophils Absolute: 0 10*3/uL (ref 0.0–0.1)
Basophils Relative: 0.7 % (ref 0.0–3.0)
Eosinophils Absolute: 0.1 10*3/uL (ref 0.0–0.7)
Eosinophils Relative: 2.5 % (ref 0.0–5.0)
HCT: 38.1 % (ref 36.0–46.0)
Hemoglobin: 12.9 g/dL (ref 12.0–15.0)
Lymphocytes Relative: 34.6 % (ref 12.0–46.0)
Lymphs Abs: 1.8 10*3/uL (ref 0.7–4.0)
MCHC: 33.9 g/dL (ref 30.0–36.0)
MCV: 87.5 fl (ref 78.0–100.0)
Monocytes Absolute: 0.4 10*3/uL (ref 0.1–1.0)
Monocytes Relative: 8.5 % (ref 3.0–12.0)
Neutro Abs: 2.7 10*3/uL (ref 1.4–7.7)
Neutrophils Relative %: 53.7 % (ref 43.0–77.0)
Platelets: 212 10*3/uL (ref 150.0–400.0)
RBC: 4.35 Mil/uL (ref 3.87–5.11)
RDW: 14 % (ref 11.5–15.5)
WBC: 5.1 10*3/uL (ref 4.0–10.5)

## 2022-01-22 LAB — SEDIMENTATION RATE: Sed Rate: 2 mm/hr (ref 0–30)

## 2022-01-22 LAB — VITAMIN B12: Vitamin B-12: 434 pg/mL (ref 211–911)

## 2022-01-22 LAB — TSH: TSH: 0.79 u[IU]/mL (ref 0.35–5.50)

## 2022-01-22 NOTE — Patient Instructions (Addendum)
I'd like you to get a CT scan of your abdomen and a chest xray. We will call you get you scheduled.   I will release your lab results to you on your MyChart account with further instructions. You may see the results before I do, but when I review them I will send you a message with my report or have my assistant call you if things need to be discussed. Please reply to my message with any questions. Thank you!

## 2022-01-22 NOTE — Progress Notes (Signed)
Subjective  CC:  Chief Complaint  Patient presents with   Fatigue    Pt stated that for the last or so she has noticed that she has been really fatigued and not feeling herself and no energy to get up and move.    HPI: Cheryl Austin is a 81 y.o. female who presents to the office today to address the problems listed above in the chief complaint. 81 year old female who complains of worsening and progressive fatigue over the last 4 to 6 weeks.  She was here a few weeks ago and we discussed her increasing anxiety, increased stress levels and need to increase her sertraline.  She also is suffering from chronic back pain from lumbar stenosis that hurts her daily.  However she reports her sleep is nonaffected.  She reports that her energy is very low and in the middle of the day she will feel like she can barely do anything.  She admits to a cough that is more annoying than anything else but this started about 4 to 6 weeks ago.  She denies hemoptysis or productive cough.  There is no pleuritic chest pain.  There is no shortness of breath.  Her palpitations are stable.  She took metoprolol just once.  She denies symptoms of lightheadedness or vertigo.  No headaches but she does feel like her head is buzzing.  No true tinnitus.  No neurologic symptoms.  She denies exertional dyspnea or chest pain.  Appetite is unchanged and she is a very light eater.  She denies constipation or change in bowel habits.  She is uncertain when she had her last colonoscopy although she thinks it was about 7 years ago.  She sees Dr. Ewing Schlein.  The last documented colonoscopy that I see 2011 denies melena.  She has a history of PMR but denies shoulder or pelvic girdle pain.  No fevers, chills or sweats.  No changes in urinary habits.  No rashes or lower extremity edema.  Assessment  1. Fatigue, unspecified type   2. Left upper quadrant abdominal mass   3. Polymyalgia rheumatica (HCC)   4. Spinal stenosis, lumbar region  without neurogenic claudication   5. GAD (generalized anxiety disorder)   6. Subacute cough   7. Abdominal pain, left upper quadrant      Plan  Fatigue, abdominal mass that is tender: Unclear etiology with broad differential.  Given abdominal tenderness with possible mass, will start work-up with abdominal pelvic CT scan and lab work.  Also will check chest x-ray given new cough.  Recommend improved nutrition.  Suspect fatigue could be multifactorial in part due to depression and chronic pain but need further evaluation given the above findings.  Patient understands and agrees care plan.  I spent a total of 41 minutes for this patient encounter. Time spent included preparation, face-to-face counseling with the patient and coordination of care, review of chart and records, and documentation of the encounter.  Follow up:  As scheduled 04/13/2022 On [place] Orders Placed This Encounter  Procedures   DG Chest 2 View   CT ABDOMEN PELVIS W CONTRAST   CBC with Differential/Platelet   Sedimentation rate   CEA   Iron, TIBC and Ferritin Panel   TSH   Vitamin B12   No orders of the defined types were placed in this encounter.     I reviewed the patients updated PMH, FH, and SocHx.    Patient Active Problem List   Diagnosis Date Noted  Paroxysmal SVT (supraventricular tachycardia) (HCC) 01/11/2022    Priority: High   Polymyalgia rheumatica (HCC) 10/23/2019    Priority: High   Seborrheic dermatitis 11/02/2014    Priority: High   Moderate episode of recurrent major depressive disorder (HCC) 06/22/2013    Priority: High   MRSA cellulitis 01/30/2013    Priority: High   Hypothyroidism 12/18/2010    Priority: High   MRSA nasal colonization 10/26/2020    Priority: Medium    Spinal stenosis, lumbar region without neurogenic claudication 10/23/2019    Priority: Medium    Lumbar facet arthropathy 10/23/2019    Priority: Medium    Spondylosis of cervical region without myelopathy or  radiculopathy 05/29/2018    Priority: Medium    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 TAKE WITH A FULL GLASS OF WATER ON AN EMPTY STOMACH.   levothyroxine (SYNTHROID) 50 MCG tablet TAKE 1 TABLET BY MOUTH EVERY DAY   LORazepam (ATIVAN) 0.5 MG tablet Take 1-2 tablets by mouth daily as needed.   meloxicam (MOBIC) 15 MG tablet Take 1 tablet (15 mg total) by mouth daily as needed.   metoprolol succinate (TOPROL-XL) 25 MG 24 hr tablet Take 1 tablet (25 mg total) by mouth daily as needed.   sertraline (ZOLOFT) 50 MG tablet Take 1.5 tablets (75 mg total) by mouth daily.   traZODone (DESYREL) 100 MG tablet Take 100 mg by mouth as needed for sleep.    Allergies: Patient has No Known Allergies. Family History: Patient family history includes Alcohol abuse in her daughter; Asthma in her mother; Depression in her daughter and mother; Heart disease in her father; Hyperlipidemia in her father; Lupus in her mother; Parkinson's disease in her mother; Stroke in her father. Social History:  Patient  reports that she has never smoked. She has never used smokeless tobacco. She reports current alcohol use of about 2.0 standard drinks per week. She reports that she does not use drugs.  Review of Systems: Constitutional: Negative for fever malaise or anorexia Cardiovascular: negative for chest pain Respiratory: negative for SOB or persistent  cough Gastrointestinal: negative for abdominal pain  Objective  Vitals: BP 112/74   Pulse 71   Temp 98.7 F (37.1 C)   Ht 5' 3.5" (1.613 m)   Wt 115 lb 12.8 oz (52.5 kg)   SpO2 95%   BMI 20.19 kg/m  General: no acute distress , A&Ox3, thin Psych: Normal sensorium.  Normal speech.   HEENT: PEERL, conjunctiva normal, neck is supple Cardiovascular:  RRR without murmur or gallop.  Respiratory:  Good breath sounds bilaterally, CTAB with normal respiratory effort Abdomen: Soft, there is left upper quadrant tenderness and a palpable mass, nonpulsatile it.  No rebound or guarding.  No other masses palpated.  No hepatosplenomegaly. Extremities: Without edema. Skin:  Warm, no rashes    Commons side effects, risks, benefits,  and alternatives for medications and treatment plan prescribed today were discussed, and the patient expressed understanding of the given instructions. Patient is instructed to call or message via MyChart if he/she has any questions or concerns regarding our treatment plan. No barriers to understanding were identified. We discussed Red Flag symptoms and signs in detail. Patient expressed understanding regarding what to do in case of urgent or emergency type symptoms.  Medication list was reconciled, printed and provided to the patient in AVS. Patient instructions and summary information was reviewed with the patient as documented in the AVS. This note was prepared with assistance of Dragon voice recognition software. Occasional wrong-word or sound-a-like substitutions may have occurred due to the inherent limitations of voice recognition software  This visit occurred during the SARS-CoV-2 public health emergency.  Safety protocols were in place, including screening questions prior to the visit, additional usage of staff PPE, and extensive cleaning of exam room while observing appropriate contact time as indicated for disinfecting solutions.

## 2022-01-23 LAB — IRON,TIBC AND FERRITIN PANEL
%SAT: 26 % (calc) (ref 16–45)
Ferritin: 28 ng/mL (ref 16–288)
Iron: 88 ug/dL (ref 45–160)
TIBC: 342 mcg/dL (calc) (ref 250–450)

## 2022-01-23 LAB — CEA: CEA: <2 ng/mL

## 2022-01-26 ENCOUNTER — Encounter: Payer: Self-pay | Admitting: Family Medicine

## 2022-01-26 ENCOUNTER — Telehealth: Payer: Self-pay

## 2022-01-26 NOTE — Telephone Encounter (Signed)
Pt has been made aware that she is due for a colonoscopy 01/26/2022. Her last one was 01/25/2010. Pt stated that she will call to get this scheduled.

## 2022-02-09 ENCOUNTER — Ambulatory Visit (INDEPENDENT_AMBULATORY_CARE_PROVIDER_SITE_OTHER)
Admission: RE | Admit: 2022-02-09 | Discharge: 2022-02-09 | Disposition: A | Discharge status: Home / Self Care | Payer: Medicare Other | Source: Ambulatory Visit | Attending: Family Medicine | Admitting: Family Medicine

## 2022-02-09 DIAGNOSIS — R5383 Other fatigue: Secondary | ICD-10-CM

## 2022-02-09 DIAGNOSIS — R052 Subacute cough: Secondary | ICD-10-CM

## 2022-02-12 ENCOUNTER — Ambulatory Visit: Payer: Medicare Other | Admitting: Family Medicine

## 2022-02-14 ENCOUNTER — Ambulatory Visit
Admission: RE | Admit: 2022-02-14 | Discharge: 2022-02-14 | Disposition: A | Discharge status: Home / Self Care | Payer: Medicare Other | Source: Ambulatory Visit | Attending: Family Medicine | Admitting: Family Medicine

## 2022-02-14 DIAGNOSIS — R1902 Left upper quadrant abdominal swelling, mass and lump: Secondary | ICD-10-CM

## 2022-02-14 DIAGNOSIS — R1012 Left upper quadrant pain: Secondary | ICD-10-CM

## 2022-02-14 MED ORDER — IOPAMIDOL (ISOVUE-300) INJECTION 61%
100.0000 mL | Freq: Once | INTRAVENOUS | Status: AC | PRN
  Administered 2022-02-14: 100 mL via INTRAVENOUS

## 2022-03-12 ENCOUNTER — Encounter: Payer: Self-pay | Admitting: Internal Medicine

## 2022-03-12 ENCOUNTER — Ambulatory Visit: Payer: Medicare Other | Admitting: Internal Medicine

## 2022-03-12 VITALS — BP 122/80 | HR 65 | Temp 97.8°F | Ht 63.5 in | Wt 114.0 lb

## 2022-03-12 DIAGNOSIS — M47816 Spondylosis without myelopathy or radiculopathy, lumbar region: Secondary | ICD-10-CM | POA: Diagnosis not present

## 2022-03-12 DIAGNOSIS — M48061 Spinal stenosis, lumbar region without neurogenic claudication: Secondary | ICD-10-CM

## 2022-03-12 DIAGNOSIS — G8929 Other chronic pain: Secondary | ICD-10-CM | POA: Diagnosis not present

## 2022-03-12 DIAGNOSIS — R413 Other amnesia: Secondary | ICD-10-CM

## 2022-03-12 NOTE — Progress Notes (Signed)
Cheryl Austin is a 81 y.o. female who presents today for an office visit.  Assessment/Plan:   Memory impairment/Fatigue/Dizziness: ordered MRI brain, neuro referral.  Cause not obvvious but fh parkinsons. Continue mvi/b12.  Tsh recent normal.  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  Advised her to follow up with Dr. Abigail Miyamoto, offered to get a neurosurg spinal opinion and she agreed.  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    Subjective:  HPI:  Patient of Dr. Mardelle Matte here for acute visit- but reason not very clear.  Probably because she is complaining of progressive memory loss and fatigue and dizziness over the last year or so.   Her reasons for wanting an acute visit seem more connected to all of this.  Appt was made for back pain initially.  However, she describes she has multiple other questions.   She is concerned her bp was 148/100 at home but this came down later.  She feels wiped out and wonders how all these things are connected.  Also notes dizzy and difficulty focusing.    When asked about the back pain, she stated it started 5 years ago, f/w Dr. Abigail Miyamoto who did nerve cautery 3 months ago but it hasnt helped.   She feels like there is one spot in her lumbar area feels like a knot- cortisone shot helped a little, made it livable.  Now in past week its gotten more painful in past weeks, stopped exercise.   Hurts to sit and stand.   The pain in her back comes and goes, never goes away but sometimes livable.   She is taking meloxicam 15 mg for it.   Wears brace. Puts cold pack on it.  Doesn't feel she can muster the energy to go to gym anymore and its too painful.  Dr. Mardelle Matte gave her something, but she stopped due to palpitations.   MRI  08/2021- showed L2-L3 nerve impingement which is where she complains of pain today that didn't respond- but she has pain mgmt doc for this.  She doesn't even recall having the MRI and seems to not have occurred to her that she should discuss this with the pain mgmt doc.  Dr. Mardelle Matte has worked up fatigue and dizziness with neg labs recently - patient just now reporting there is a memory issue.  The problem with focusing and feeling dizzy is chronic, ever since the beginning of this year. Denies any blackouts or passouts.  Does get some tremors when anxious.  First noticed a worsening memory issue this year.   Takes mvi and b12 and had normal tsh/cbc/cmp last month  Feel totally drained of all energy      Objective:  Physical Exam: BP 122/80   Pulse 65   Temp 97.8 F (36.6 C)   Ht 5' 3.5" (1.613 m)   Wt 114 lb (51.7 kg)   SpO2 97%   BMI 19.88 kg/m   Gen: No acute distress, resting comfortably-appears stressed and tired. CV: Regular rate and rhythm with no murmurs appreciated Pulm: Normal work of breathing, clear to auscultation bilaterally with no crackles, wheezes, or rhonchi Neuro: Grossly normal, moves all extremities Psych: Normal affect and thought content Memory is off, cant remember that she had MRI earlier this  year and much of her prior medical workup.   No tremor noted but she reports having them when anxious   Tenderness over L2-L3, slow slightly hunched gait

## 2022-03-12 NOTE — Patient Instructions (Signed)
We discussed the following:  Memory impairment/Fatigue/Dizziness:  we ordered MRI brain, neuro referral.  Cause not obvvious but with family history of parkinsons I would like neuro to evaluate and to rule out brain tumors and strokes with MRI. Continue multivitamin and b12.     Hypertension: controlled today in office.  Its sometimes a little high due to pain and anxiety and salt, but not to worry about urgently until its over 180 systolic.     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  I want you to  to follow up with Dr. Abigail Miyamoto and let him know, and we are also going to get a neurosurgery opinion.  I dont think the back pain is related to the memory issues, fatigue, or dizziness.     Diazepam:  You should know that diazepam has been associated with dementia AND is a strong indication to being prescribed opioid pain medicine- you should try to taper the dose down slowly if you think you can do so safely.  Discuss this further with Dr. Mardelle Matte before taking action.

## 2022-03-26 ENCOUNTER — Ambulatory Visit: Payer: Medicare Other | Admitting: Family Medicine

## 2022-03-26 VITALS — BP 138/80 | HR 66 | Temp 98.8°F | Ht 63.5 in | Wt 113.8 lb

## 2022-03-26 DIAGNOSIS — F331 Major depressive disorder, recurrent, moderate: Secondary | ICD-10-CM | POA: Diagnosis not present

## 2022-03-26 DIAGNOSIS — M48061 Spinal stenosis, lumbar region without neurogenic claudication: Secondary | ICD-10-CM

## 2022-03-26 DIAGNOSIS — F411 Generalized anxiety disorder: Secondary | ICD-10-CM | POA: Diagnosis not present

## 2022-03-26 NOTE — Progress Notes (Signed)
Subjective  CC:  Chief Complaint  Patient presents with   Follow-up    HPI: Cheryl Austin is a 81 y.o. female who presents to the office today to address the problems listed above in the chief complaint. Reviewed note from 2 weeks ago.  Patient comes in with a myriad of disjointed complaints.  Mostly does not feel well.  Admits to increased stressors, difficulty thinking and concentrating due to stress and anxiety symptoms.  She denies panic symptoms.  She does have some depressive symptoms.  She is currently on sertraline 75 mg daily.  She is no longer seeing Dr. Casimiro Needle her psychiatrist but is due to see him.  She saw another provider for an acute care visit and we discussed memory loss.  However she denies problems with orientation, memory, function.  She more describes difficulty concentrating.  She is very stressed because she is trying to get her house in order to sell but has not been successful due to many barriers.  She denies confusion, disorientation, problems with carrying out her ADLs or I ADLs.  She complains of feeling off in the head but denies true lightheadedness or vertigo.  She has intermittent palpitations and metoprolol is helpful for her back.  She has chronic back pain that persists.  She is seeing a specialist.  She has intermittent abdominal pain, has constipation and has not been successful with treatment.  She does have a follow-up appointment with GI in about 2 weeks.  This was also to follow-up on abnormal CT scan showing some small bowel thickening.  She is on Fosamax.  This could be contributing/some GERD symptoms.  She has been on diazepam daily, prescribed by Dr. Casimiro Needle.  She does feel that that helps her symptoms.  Assessment  1. GAD (generalized anxiety disorder)   2. Spinal stenosis, lumbar region without neurogenic claudication   3. Moderate episode of recurrent major depressive disorder (HCC)      Plan  Mood disorder: I suspect that most of her  symptomatology is stemming from mood and anxiety and stress.  She will make an appointment with Dr. Casimiro Needle to discuss further.  Will continue sertraline 75 daily and diazepam 5 mg daily. Fatigue, back pain, abdominal pain and feeling off: Likely unrelated.  Multifactorial.  Continue with back doctor.  To see GI for further workup of GI problems.  Once mood is addressed, will need to further workup these problems if needed.  Lab work today, abdominal pelvic CT and chest x-ray has been unremarkable. Memory concerns: Mainly in the setting of high stress and anxiety: Will treat mood first and then reevaluate.  Defer neurology and brain MRI at this time.  Follow up: Recheck 1 month 04/13/2022  No orders of the defined types were placed in this encounter.  No orders of the defined types were placed in this encounter.     I reviewed the patients updated PMH, FH, and SocHx.    Patient Active Problem List   Diagnosis Date Noted   Paroxysmal SVT (supraventricular tachycardia) ([place]) 01/11/2022    Priority: High   Polymyalgia rheumatica ([place]) 10/23/2019    Priority: High   Seborrheic dermatitis 11/02/2014    Priority: High   Moderate episode of recurrent major depressive disorder ([place]) 06/22/2013    Priority: High   MRSA cellulitis 01/30/2013    Priority: High   Hypothyroidism 12/18/2010    Priority: High   GAD (generalized anxiety disorder) 10/26/2020    Priority: Medium  MRSA nasal colonization 10/26/2020    Priority: Medium    Spinal stenosis, lumbar region without neurogenic claudication 10/23/2019    Priority: Medium    Lumbar facet arthropathy 10/23/2019    Priority: Medium    Spondylosis of cervical region without myelopathy or radiculopathy 05/29/2018    Priority: Medium    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 TAKE WITH A FULL GLASS OF WATER ON AN EMPTY STOMACH.   betamethasone dipropionate 0.05 % cream APPLY TO AFFECTED AREA TWICE A DAY   levothyroxine (SYNTHROID) 50 MCG tablet TAKE 1 TABLET BY MOUTH EVERY DAY   LORazepam (ATIVAN) 0.5 MG tablet Take 1-2 tablets by mouth daily as needed.   meloxicam (MOBIC) 15 MG tablet Take 1 tablet (15 mg total) by mouth daily as needed.   metoprolol succinate (TOPROL-XL) 25 MG 24 hr tablet Take 1 tablet (25 mg total) by mouth daily as needed.   sertraline (ZOLOFT) 50 MG tablet Take 1.5 tablets (75 mg total) by mouth daily.   traZODone (DESYREL) 100 MG tablet Take 100 mg by mouth as needed for sleep.    Allergies: Patient has No Known Allergies. Family History: Patient family history includes Alcohol abuse in her daughter; Asthma in her mother; Depression in her daughter and mother; Heart disease in her father; Hyperlipidemia in her father; Lupus in her mother; Parkinson's disease in her mother; Stroke in her father. Social History:  Patient  reports that she has never smoked. She has never used smokeless tobacco. She reports current alcohol use of about 2.0 standard drinks of alcohol per week. She reports that she does not use drugs.  Review of Systems: Constitutional: Negative for fever malaise or anorexia Cardiovascular: negative for chest pain Respiratory: negative for SOB or persistent cough Gastrointestinal: negative for abdominal pain  Objective  Vitals: BP 138/80   Pulse 66   Temp 98.8 F (37.1 C)   Ht 5' 3.5" (1.613 m)   Wt 113 lb 12.8 oz (51.6 kg)   SpO2 99%   BMI 19.84 kg/m   General: no acute distress  , A&Ox3, negative orthostatic blood pressures today Psych: Very anxious HEENT: PEERL, conjunctiva normal, neck is supple Cardiovascular:  RRR without murmur or gallop.  Respiratory:  Good breath sounds bilaterally, CTAB with normal respiratory effort Skin:  Warm, no rashes    Commons side effects, risks, benefits, and alternatives for medications and treatment plan prescribed today were discussed, and the patient expressed understanding of the given instructions. Patient is instructed to call or message via MyChart if he/she has any questions or concerns regarding our treatment plan. No barriers to understanding were identified. We discussed Red Flag symptoms and signs in detail. Patient expressed understanding regarding what to do in case of urgent or emergency type symptoms.  Medication list was reconciled, printed and provided to the patient in AVS. Patient instructions and summary information was reviewed with the patient as documented  in the AVS. This note was prepared with assistance of Dragon voice recognition software. Occasional wrong-word or sound-a-like substitutions may have occurred due to the inherent limitations of voice recognition software  This visit occurred during the SARS-CoV-2 public health emergency.  Safety protocols were in place, including screening questions prior to the visit, additional usage of staff PPE, and extensive cleaning of exam room while observing appropriate contact time as indicated for disinfecting solutions.

## 2022-04-01 ENCOUNTER — Other Ambulatory Visit: Payer: Self-pay | Admitting: Rheumatology

## 2022-04-01 DIAGNOSIS — M810 Age-related osteoporosis without current pathological fracture: Secondary | ICD-10-CM

## 2022-04-02 NOTE — Telephone Encounter (Signed)
Next Visit: 05/18/2022  Last Visit: 11/17/2021  Last Fill: 10/13/2021  DX: Age-related osteoporosis without current pathological fracture  Current Dose per office note 11/17/2021: Fosamax 70mg  by mouth once weekly.  Labs: 01/22/2022 CBC WNL, 09/13/2021 CMP WNL  Okay to refill Fosamax?

## 2022-04-03 ENCOUNTER — Ambulatory Visit: Payer: Medicare Other | Admitting: Family Medicine

## 2022-04-08 ENCOUNTER — Other Ambulatory Visit: Payer: Self-pay | Admitting: Family Medicine

## 2022-04-09 ENCOUNTER — Other Ambulatory Visit: Payer: Self-pay | Admitting: Family Medicine

## 2022-04-13 ENCOUNTER — Ambulatory Visit: Payer: Medicare Other | Admitting: Physician Assistant

## 2022-04-13 ENCOUNTER — Encounter: Payer: Self-pay | Admitting: Family Medicine

## 2022-04-13 ENCOUNTER — Encounter: Payer: Self-pay | Admitting: Physician Assistant

## 2022-04-13 ENCOUNTER — Ambulatory Visit: Payer: Medicare Other | Admitting: Family Medicine

## 2022-04-13 ENCOUNTER — Other Ambulatory Visit: Payer: Self-pay | Admitting: Physician Assistant

## 2022-04-13 VITALS — BP 140/80 | HR 62 | Temp 97.9°F | Ht 63.5 in | Wt 112.4 lb

## 2022-04-13 DIAGNOSIS — R350 Frequency of micturition: Secondary | ICD-10-CM

## 2022-04-13 DIAGNOSIS — E871 Hypo-osmolality and hyponatremia: Secondary | ICD-10-CM

## 2022-04-13 DIAGNOSIS — N37 Urethral disorders in diseases classified elsewhere: Secondary | ICD-10-CM | POA: Diagnosis not present

## 2022-04-13 LAB — BASIC METABOLIC PANEL
BUN: 12 mg/dL (ref 6–23)
CO2: 27 mEq/L (ref 19–32)
Calcium: 9.2 mg/dL (ref 8.4–10.5)
Chloride: 97 mEq/L (ref 96–112)
Creatinine, Ser: 0.75 mg/dL (ref 0.40–1.20)
GFR: 75.6 mL/min (ref >60.00)
Glucose, Bld: 93 mg/dL (ref 70–99)
Potassium: 4.7 mEq/L (ref 3.5–5.1)
Sodium: 129 mEq/L — ABNORMAL LOW (ref 135–145)

## 2022-04-13 LAB — POCT URINALYSIS DIPSTICK
Bilirubin, UA: NEGATIVE
Blood, UA: NEGATIVE
Glucose, UA: NEGATIVE
Ketones, UA: NEGATIVE
Leukocytes, UA: NEGATIVE
Nitrite, UA: NEGATIVE
Protein, UA: NEGATIVE
Spec Grav, UA: <=1.005 — AB (ref 1.010–1.025)
Urobilinogen, UA: 0.2 E.U./dL
pH, UA: 7 (ref 5.0–8.0)

## 2022-04-13 MED ORDER — CEPHALEXIN 500 MG PO CAPS: 500.0000 mg | ORAL_CAPSULE | Freq: Two times a day (BID) | ORAL | 0 refills | Status: DC

## 2022-04-13 NOTE — Patient Instructions (Signed)
It was great to see you!  I'm going to put in a referral to urology -- Dr. Estrellita Ludwig at [facility].  Please go ahead and start oral antibiotic -- I have sent this in.  We will check your kidney function today and also send your urine off for a culture - we will be in touch with these results.  Follow-up with Dr. Mardelle Matte if your symptoms persist while you await urology.  Take care,  Jarold Motto PA-C

## 2022-04-13 NOTE — Progress Notes (Signed)
Cheryl Austin is a 81 y.o. female here for a new problem.  History of Present Illness:   Chief Complaint  Patient presents with  . Urinary Frequency    Pt c/o urinary frequency x 3 weeks, also having Nocturia 7-10 x's a night. Some low back pain.    HPI  Urinary frequency She reports frequency of urination for 3 weeks. If she drinks any fluids, within 15-20 minutes she has to urinate and she is urinating a lot. Has pressure in her bladder if she pushes on it. Denies any other symptoms.   She mentions she had some kidney issues after the birth of daughter about 60 years ago. She had a hospital stay for at least 30 days where she almost "lost her kidney." Had severe fevers and was "packed in ice."   Every 3-4 months or so up until 15 years ago required urethral stretching. She is not sure if this is something that she needs to have again.  Denies concerns with constipation, burning, stinging with urination, kidney stones, hematuria, unusual back pain, n/v/d, malaise, vaginal discharge.    Past Medical History:  Diagnosis Date  . Anxiety   . 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 Family history of Parkinson disease 05/21/2016   Mother  . GERD (gastroesophageal reflux disease)   . Kidney failure    blood transfusion  . Moderate episode of recurrent major depressive disorder (HCC)   . Osteoporosis   . Polymyalgia rheumatica (HCC) 10/23/2019   Aug 2020, Dr. Corliss Skains  . Restless legs syndrome (RLS) 08/02/2017  . 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 Thyroid disease    Hypothyroid     Social History   Tobacco Use  . Smoking status: Never  . Smokeless tobacco: Never  Vaping Use  . Vaping Use: Never used  Substance Use Topics  . Alcohol use: Yes    Alcohol/week: 2.0 standard drinks of alcohol    Types: 2 Shots of liquor per week  . Drug use: No    Past  Surgical History:  Procedure Laterality Date  . APPENDECTOMY  1961  . BREAST SURGERY Bilateral 1987 1998   Lumpectomy  . EYE SURGERY    . TOTAL ABDOMINAL HYSTERECTOMY  1986   BSO/Fibroids    Family History  Problem Relation Age of Onset  . Lupus Mother   . Asthma Mother   . Parkinson's disease Mother   . Depression Mother   . Heart disease Father   . Hyperlipidemia Father   . Stroke Father   . Alcohol abuse Daughter   . Depression Daughter     No Known Allergies  Current Medications:   Current Outpatient Medications:  .  alendronate (FOSAMAX) 70 MG tablet, TAKE 1 TABLET ONCE A WEEK. TAKE WITH A FULL GLASS OF WATER ON AN EMPTY STOMACH., Disp: 12 tablet, Rfl: 1 .  betamethasone dipropionate 0.05 % cream, APPLY TO AFFECTED AREA TWICE A DAY, Disp: 45 g, Rfl: 0 .  levothyroxine (SYNTHROID) 50 MCG tablet, TAKE 1 TABLET BY MOUTH EVERY DAY, Disp: 90 tablet, Rfl: 3 .  LORazepam (ATIVAN) 0.5 MG tablet, Take 1-2 tablets by mouth daily as needed., Disp: 30 tablet, Rfl: 2 .  meloxicam (MOBIC) 15 MG tablet, TAKE 1 TABLET BY MOUTH DAILY AS NEEDED., Disp: 30 tablet, Rfl: 2 .  metoprolol succinate (TOPROL-XL) 25 MG 24 hr tablet, TAKE 1 TABLET  BY MOUTH DAILY AS NEEDED., Disp: 90 tablet, Rfl: 1 .  sertraline (ZOLOFT) 50 MG tablet, Take 1.5 tablets (75 mg total) by mouth daily., Disp: 90 tablet, Rfl: 3 .  traZODone (DESYREL) 100 MG tablet, Take 100 mg by mouth as needed for sleep., Disp: , Rfl:    Review of Systems:   ROS Negative unless otherwise specified per HPI.  Vitals:   Vitals:   04/13/22 1127  BP: (!) 140/80  Pulse: 62  Temp: 97.9 F (36.6 C)  TempSrc: Temporal  SpO2: 100%  Weight: 112 lb 6.1 oz (51 kg)  Height: 5' 3.5" (1.613 m)     Body mass index is 19.59 kg/m.  Physical Exam:   Physical Exam Vitals and nursing note reviewed.  Constitutional:      General: She is not in acute distress.    Appearance: She is well-developed. She is not ill-appearing or  toxic-appearing.  Cardiovascular:     Rate and Rhythm: Normal rate and regular rhythm.     Pulses: Normal pulses.     Heart sounds: Normal heart sounds, S1 normal and S2 normal.  Pulmonary:     Effort: Pulmonary effort is normal.     Breath sounds: Normal breath sounds.  Abdominal:     Tenderness: There is abdominal tenderness in the suprapubic area. There is no right CVA tenderness or left CVA tenderness.  Skin:    General: Skin is warm and dry.  Neurological:     Mental Status: She is alert.     GCS: GCS eye subscore is 4. GCS verbal subscore is 5. GCS motor subscore is 6.  Psychiatric:        Speech: Speech normal.        Behavior: Behavior normal. Behavior is cooperative.    Results for orders placed or performed in visit on 04/13/22  POCT urinalysis dipstick  Result Value Ref Range   Color, UA yellow    Clarity, UA clear    Glucose, UA Negative Negative   Bilirubin, UA Negative    Ketones, UA Negative    Spec Grav, UA <=1.005 (A) 1.010 - 1.025   Blood, UA Negative    pH, UA 7.0 5.0 - 8.0   Protein, UA Negative Negative   Urobilinogen, UA 0.2 0.2 or 1.0 E.U./dL   Nitrite, UA Negative    Leukocytes, UA Negative Negative   Appearance clear    Odor none     Assessment and Plan:   Frequency of urination No red flags on exam Will empirically treat with oral keflex 500 mg BID x 7 days Urine culture pending Rest and push fluids If new/worsening symptoms, she is to follow-up with PCP  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

## 2022-04-14 LAB — URINE CULTURE
MICRO NUMBER:: 13832979
Result:: NO GROWTH
SPECIMEN QUALITY:: ADEQUATE

## 2022-04-15 ENCOUNTER — Encounter: Payer: Self-pay | Admitting: Physician Assistant

## 2022-04-16 ENCOUNTER — Other Ambulatory Visit (INDEPENDENT_AMBULATORY_CARE_PROVIDER_SITE_OTHER): Payer: Medicare Other

## 2022-04-16 DIAGNOSIS — E871 Hypo-osmolality and hyponatremia: Secondary | ICD-10-CM | POA: Diagnosis not present

## 2022-04-16 LAB — COMPREHENSIVE METABOLIC PANEL
ALT: 16 U/L (ref 0–35)
AST: 21 U/L (ref 0–37)
Albumin: 4.3 g/dL (ref 3.5–5.2)
Alkaline Phosphatase: 72 U/L (ref 39–117)
BUN: 15 mg/dL (ref 6–23)
CO2: 25 mEq/L (ref 19–32)
Calcium: 9.2 mg/dL (ref 8.4–10.5)
Chloride: 98 mEq/L (ref 96–112)
Creatinine, Ser: 0.7 mg/dL (ref 0.40–1.20)
GFR: 82.12 mL/min (ref >60.00)
Glucose, Bld: 92 mg/dL (ref 70–99)
Potassium: 4.2 mEq/L (ref 3.5–5.1)
Sodium: 132 mEq/L — ABNORMAL LOW (ref 135–145)
Total Bilirubin: 0.4 mg/dL (ref 0.2–1.2)
Total Protein: 6.3 g/dL (ref 6.0–8.3)

## 2022-04-20 LAB — SODIUM, URINE, RANDOM: Sodium, Ur: 22 mmol/L — ABNORMAL LOW (ref 28–272)

## 2022-04-20 LAB — OSMOLALITY, URINE: Osmolality, Ur: 159 mOsm/kg (ref 50–1200)

## 2022-04-20 LAB — OSMOLALITY: Osmolality: 273 mOsm/kg — ABNORMAL LOW (ref 278–305)

## 2022-04-25 ENCOUNTER — Other Ambulatory Visit: Payer: Self-pay | Admitting: Family Medicine

## 2022-04-25 ENCOUNTER — Encounter: Payer: Self-pay | Admitting: Family Medicine

## 2022-04-25 ENCOUNTER — Ambulatory Visit: Payer: Medicare Other | Admitting: Family Medicine

## 2022-04-25 VITALS — BP 126/78 | HR 68 | Temp 98.0°F | Ht 63.5 in | Wt 111.8 lb

## 2022-04-25 DIAGNOSIS — E871 Hypo-osmolality and hyponatremia: Secondary | ICD-10-CM

## 2022-04-25 DIAGNOSIS — M818 Other osteoporosis without current pathological fracture: Secondary | ICD-10-CM

## 2022-04-25 DIAGNOSIS — N3941 Urge incontinence: Secondary | ICD-10-CM

## 2022-04-25 DIAGNOSIS — K2950 Unspecified chronic gastritis without bleeding: Secondary | ICD-10-CM | POA: Diagnosis not present

## 2022-04-25 DIAGNOSIS — T380X5A Adverse effect of glucocorticoids and synthetic analogues, initial encounter: Secondary | ICD-10-CM

## 2022-04-25 DIAGNOSIS — F411 Generalized anxiety disorder: Secondary | ICD-10-CM

## 2022-04-25 MED ORDER — OXYBUTYNIN CHLORIDE ER 5 MG PO TB24: 5.0000 mg | ORAL_TABLET | Freq: Every day | ORAL | 5 refills | Status: DC

## 2022-04-25 NOTE — Patient Instructions (Signed)
Please return in January 2024 for complete physical.   Hold your fosamax to see if that helps your upper GI symptoms.   If you have any questions or concerns, please don't hesitate to send me a message via MyChart or call the office at [phone]. Thank you for visiting with [place] today! It's our pleasure caring for you.   Overactive Bladder, Adult  Overactive bladder is a condition in which a person has a sudden and frequent need to urinate. A person might also leak urine if he or she cannot get to the bathroom fast enough (urinary incontinence). Sometimes, symptoms can interfere with work or social activities. What are the causes? Overactive bladder is associated with poor nerve signals between your bladder and your brain. Your bladder may get the signal to empty before it is full. You may also have very sensitive muscles that make your bladder squeeze too soon. This condition may also be caused by other factors, such as: Medical conditions: Urinary tract infection. Infection of nearby tissues. Prostate enlargement. Bladder stones, inflammation, or tumors. Diabetes. Muscle or nerve weakness, especially from these conditions: A spinal cord injury. Stroke. Multiple sclerosis. Parkinson's disease. Other causes: Surgery on the uterus or urethra. Drinking too much caffeine or alcohol. Certain medicines, especially those that eliminate extra fluid in the body (diuretics). Constipation. What increases the risk? You may be at greater risk for overactive bladder if you: Are an older adult. Smoke. Are going through menopause. Have prostate problems. Have a neurological disease, such as stroke, dementia, Parkinson's disease, or multiple sclerosis (MS). Eat or drink alcohol, spicy food, caffeine, and other things that irritate the bladder. Are overweight or obese. What are the signs or symptoms? Symptoms of this condition include a sudden, strong urge to urinate. Other symptoms  include: Leaking urine. Urinating 8 or more times a day. Waking up to urinate 2 or more times overnight. How is this diagnosed? This condition may be diagnosed based on: Your symptoms and medical history. A physical exam. Blood or urine tests to check for possible causes, such as infection. You may also need to see a health care provider who specializes in urinary tract problems. This is called a urologist. How is this treated? Treatment for overactive bladder depends on the cause of your condition and whether it is mild or severe. Treatment may include: Bladder training, such as: Learning to control the urge to urinate by following a schedule to urinate at regular intervals. Doing Kegel exercises to strengthen the pelvic floor muscles that support your bladder. Special devices, such as: Biofeedback. This uses sensors to help you become aware of your body's signals. Electrical stimulation. This uses electrodes placed inside the body (implanted) or outside the body. These electrodes send gentle pulses of electricity to strengthen the nerves or muscles that control the bladder. Women may use a plastic device, called a pessary, that fits into the vagina and supports the bladder. Medicines, such as: Antibiotics to treat bladder infection. Antispasmodics to stop the bladder from releasing urine at the wrong time. Tricyclic antidepressants to relax bladder muscles. Injections of botulinum toxin type A directly into the bladder tissue to relax bladder muscles. Surgery, such as: A device may be implanted to help manage the nerve signals that control urination. An electrode may be implanted to stimulate electrical signals in the bladder. A procedure may be done to change the shape of the bladder. This is done only in very severe cases. Follow these instructions at home: Eating and drinking  Make diet or lifestyle changes recommended by your health care provider. These may include: Drinking  fluids throughout the day and not only with meals. Cutting down on caffeine or alcohol. Eating a healthy and balanced diet to prevent constipation. This may include: Choosing foods that are high in fiber, such as beans, whole grains, and fresh fruits and vegetables. Limiting foods that are high in fat and processed sugars, such as fried and sweet foods. Lifestyle  Lose weight if needed. Do not use any products that contain nicotine or tobacco. These include cigarettes, chewing tobacco, and vaping devices, such as e-cigarettes. If you need help quitting, ask your health care provider. General instructions Take over-the-counter and prescription medicines only as told by your health care provider. If you were prescribed an antibiotic medicine, take it as told by your health care provider. Do not stop taking the antibiotic even if you start to feel better. Use any implants or pessary as told by your health care provider. If needed, wear pads to absorb urine leakage. Keep a log to track how much and when you drink, and when you need to urinate. This will help your health care provider monitor your condition. Keep all follow-up visits. This is important. Contact a health care provider if: You have a fever or chills. Your symptoms do not get better with treatment. Your pain and discomfort get worse. You have more frequent urges to urinate. Get help right away if: You are not able to control your bladder. Summary Overactive bladder refers to a condition in which a person has a sudden and frequent need to urinate. Several conditions may lead to an overactive bladder. Treatment for overactive bladder depends on the cause and severity of your condition. Making lifestyle changes, doing Kegel exercises, keeping a log, and taking medicines can help with this condition. This information is not intended to replace advice given to you by your health care provider. Make sure you discuss any questions you  have with your health care provider. Document Revised: 04/25/2020 Document Reviewed: 04/25/2020 Elsevier Patient Education  2023 [employer].

## 2022-04-25 NOTE — Progress Notes (Signed)
Subjective  CC: Follow-up  HPI: Cheryl Austin is a 81 y.o. female who presents to the office today to address the problems listed above in the chief complaint. Fortunately, she is doing much better.  I reviewed recent notes.  Forgetfulness memory concerns are much improved since she put her house was about ready to put her house on the market.  She feels less stressed.  She continues on Zoloft 75 mg daily.  She has not yet seen psychiatry but does not feel like she needs it at the moment. Gastritis: She did follow-up with GI.  Likely has multifactorial related gastritis, in part due to Fosamax and stress.  We will hold Fosamax and start PPI.  She is scheduled for upper endoscopy with GI. Anxiety disorder: Improved as noted above. Hypotonic hyponatremia: Reviewed notes and lab work.  We discussed her nutrition.  Eats mostly lettuce and salads.  Does not get much protein.  Does not drink much fluid during the day but drinks several cans of seltzer water at night.  No swelling. Complains of urge incontinence worse at night.  Does have overactive bladder symptoms.  Assessment  1. Hypo-osmolar hyponatremia   2. GAD (generalized anxiety disorder)   3. 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 Urge incontinence   5. Steroid-induced osteoporosis      Plan  Hypotonic hyponatremia: Likely due to poor nutrition, mild dehydration and possible SIADH.  Improved.  Recommend good hydration and need to eat a better diet.  Patient agrees.  Also could be related to her SSRI.  Will monitor GAD: Continue Zoloft 75 mg daily.  Will monitor.  To see psychiatry again if needed Gastritis: Stop Fosamax and start PPI.  Should improve.  We will await outcome results of the endoscopy.  May need to change osteoporosis medications Urge incontinence: Start anticholinergic.  Education given.  Follow up: January for complete physical. 08/24/2022  No orders of the defined types were placed in this  encounter.  Meds ordered this encounter  Medications   oxybutynin (DITROPAN XL) 5 MG 24 hr tablet    Sig: Take 1 tablet (5 mg total) by mouth at bedtime.    Dispense:  30 tablet    Refill:  5      I reviewed the patients updated PMH, FH, and SocHx.    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    Priority: Medium    Spinal stenosis, lumbar region without neurogenic claudication 10/23/2019    Priority: Medium    Lumbar facet arthropathy 10/23/2019    Priority: Medium    Spondylosis of cervical region without myelopathy or radiculopathy 05/29/2018    Priority: Medium    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    Allergies: Patient has No Known Allergies. Family History: Patient family history includes Alcohol abuse in her daughter; Asthma in her mother; Depression in her daughter and mother; Heart disease in her father; Hyperlipidemia in her father; Lupus in her  mother; Parkinson's disease in her mother; Stroke in her father. Social History:  Patient  reports that she has never smoked. She has never used smokeless tobacco. She reports current alcohol use of about 2.0 standard drinks of alcohol per week. She reports that she does not use drugs.  Review of Systems: Constitutional: Negative for fever malaise or anorexia Cardiovascular: negative for chest pain Respiratory: negative for SOB or persistent cough Gastrointestinal: negative for abdominal pain  Objective  Vitals: BP 126/78   Pulse 68   Temp 98 F (36.7 C)   Ht 5' 3.5" (1.613 m)   Wt 111 lb 12.8 oz (50.7 kg)   SpO2 97%   BMI 19.49 kg/m  General: no acute distress , A&Ox3, looks much better, happier. HEENT: PEERL, conjunctiva normal, neck is supple Cardiovascular:  RRR without murmur or gallop.  Respiratory:  Good breath sounds bilaterally, CTAB with normal respiratory effort Skin:  Warm, no rashes    Commons side effects, risks, benefits, and alternatives for medications and treatment plan prescribed today were discussed, and the patient expressed understanding of the given instructions. Patient is instructed to call or message via MyChart if he/she has any questions or concerns regarding our treatment plan. No barriers to understanding were identified. We discussed Red Flag symptoms and signs in detail. Patient expressed understanding regarding what to do in case of urgent or emergency type symptoms.  Medication list was reconciled, printed and provided to the patient in AVS. Patient instructions and summary information was reviewed with the patient as documented in the AVS. This note was prepared with assistance of Dragon voice recognition software. Occasional wrong-word or sound-a-like substitutions may have occurred due to the inherent limitations of voice recognition software  This visit occurred during the SARS-CoV-2 public health emergency.  Safety protocols were in place,  including screening questions prior to the visit, additional usage of staff PPE, and extensive cleaning of exam room while observing appropriate contact time as indicated for disinfecting solutions.

## 2022-05-02 ENCOUNTER — Encounter: Payer: Self-pay | Admitting: Family Medicine

## 2022-05-02 ENCOUNTER — Ambulatory Visit: Payer: Medicare Other | Admitting: Family Medicine

## 2022-05-02 VITALS — BP 130/74 | HR 73 | Temp 98.9°F | Ht 63.5 in | Wt 113.4 lb

## 2022-05-02 DIAGNOSIS — Z23 Encounter for immunization: Secondary | ICD-10-CM

## 2022-05-02 DIAGNOSIS — K219 Gastro-esophageal reflux disease without esophagitis: Secondary | ICD-10-CM | POA: Diagnosis not present

## 2022-05-02 DIAGNOSIS — R053 Chronic cough: Secondary | ICD-10-CM | POA: Insufficient documentation

## 2022-05-02 DIAGNOSIS — J302 Other seasonal allergic rhinitis: Secondary | ICD-10-CM | POA: Diagnosis not present

## 2022-05-02 MED ORDER — OMEPRAZOLE 20 MG PO CPDR: 20.0000 mg | DELAYED_RELEASE_CAPSULE | Freq: Every day | ORAL | 3 refills | Status: DC

## 2022-05-02 MED ORDER — FLUTICASONE PROPIONATE 50 MCG/ACT NA SUSP: 1.0000 | Freq: Every day | NASAL | 6 refills | Status: DC

## 2022-05-02 NOTE — Patient Instructions (Signed)
Please follow up as scheduled for your next visit with me: 08/24/2022   Start zyrtec 10 nighlty, flonase nasal spray daily and omeprazole daily. These things should help your allergies, cough and reflux symptoms.   Do not take the fosamax for now.   If you have any questions or concerns, please don't hesitate to send me a message via MyChart or call the office at [phone]. Thank you for visiting with Cheryl Austin today! It's our pleasure caring for you.

## 2022-05-02 NOTE — Progress Notes (Signed)
Subjective  CC:  Chief Complaint  Patient presents with   Cough    persistent cough before traveling, worse in the afternoons and evenings    HPI: Cheryl Austin is a 81 y.o. female who presents to the office today to address the problems listed above in the chief complaint. 81 year old with remote history of chronic cough evaluated back in 2019 treated with allergy and reflux medicines returns due to worsening of her cough for the last 2 weeks.  She has been cleaning her house and has been exposed to more dust.  She also is undergoing evaluation for gastritis, possibly worsened by biphosphonate's.  See last note.  She does admit to significant increase in reflux and heartburn symptoms.  No nausea vomiting or hematemesis.  No shortness of breath or wheezing.  She did have a recent chest x-ray that was within normal limits.  She had a mild chronic cough over the last beginning part of this year but it has worsened as noted above.  Nonproductive.  Mild PND.  No significant sneezing or eye symptoms.  No fevers chills malaise myalgias or symptoms of respiratory infection.  She is traveling on a cruise next week and wants to know if there are medicines that can help her cough.  She was not very responsive to [employer] in the past.  Assessment  1. Chronic cough   2. Gastroesophageal reflux disease, unspecified whether esophagitis present   3. Seasonal allergic rhinitis, unspecified trigger   4. Need for immunization against influenza      Plan  Chronic cough: Likely multifactorial due to worsening GERD from gastritis/Fosamax, and dust allergies.  Restart Zyrtec, Flonase and omeprazole daily.  Reassured.  No symptoms of infection.  Normal lung exam today. Flu shot updated. We will hold Fosamax until after GI evaluation.  Follow up: As scheduled 08/24/2022  Orders Placed This Encounter  Procedures   Flu Vaccine QUAD High Dose(Fluad)   Meds ordered this encounter  Medications    fluticasone (FLONASE) 50 MCG/ACT nasal spray    Sig: Place 1 spray into both nostrils daily.    Dispense:  16 g    Refill:  6   omeprazole (PRILOSEC) 20 MG capsule    Sig: Take 1 capsule (20 mg total) by mouth daily.    Dispense:  30 capsule    Refill:  3      I reviewed the patients updated PMH, FH, and SocHx.    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    Priority: Medium    Spinal stenosis, lumbar region without neurogenic claudication 10/23/2019    Priority: Medium    Lumbar facet arthropathy 10/23/2019    Priority: Medium    Spondylosis of cervical region without myelopathy or radiculopathy 05/29/2018    Priority: Medium    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   omeprazole (PRILOSEC) 20 MG capsule Take 1 capsule (20 mg total) by mouth daily.    Allergies: Patient has No Known Allergies. Family History: Patient family history  includes Alcohol abuse in her daughter; Asthma in her mother; Depression in her daughter and mother; Heart disease in her father; Hyperlipidemia in her father; Lupus in her mother; Parkinson's disease in her mother; Stroke in her father. Social History:  Patient  reports that she has never smoked. She has never used smokeless tobacco. She reports current alcohol use of about 2.0 standard drinks of alcohol per week. She reports that she does not use drugs.  Review of Systems: Constitutional: Negative for fever malaise or anorexia Cardiovascular: negative for chest pain Respiratory: negative for SOB or persistent cough Gastrointestinal: negative for abdominal pain  Objective  Vitals: BP 130/74   Pulse 73   Temp 98.9 F (37.2 C)   Ht 5' 3.5" (1.613 m)   Wt 113 lb 6.4 oz (51.4 kg)   SpO2 96%   BMI 19.77 kg/m  General: no acute distress , A&Ox3, dry irritant cough present HEENT: PEERL, conjunctiva normal, neck is supple Cardiovascular:  RRR without murmur or gallop.  Respiratory:  Good breath sounds bilaterally, CTAB with normal respiratory effort Skin:  Warm, no rashes    Commons side effects, risks, benefits, and alternatives for medications and treatment plan prescribed today were discussed, and the patient expressed understanding of the given instructions. Patient is instructed to call or message via MyChart if he/she has any questions or concerns regarding our treatment plan. No barriers to understanding were identified. We discussed Red Flag symptoms and signs in detail. Patient expressed understanding regarding what to do in case of urgent or emergency type symptoms.  Medication list was reconciled, printed and provided to the patient in AVS. Patient instructions and summary information was reviewed with the patient as documented in the AVS. This note was prepared with assistance of Dragon voice recognition software. Occasional wrong-word or sound-a-like substitutions may have  occurred due to the inherent limitations of voice recognition software  This visit occurred during the SARS-CoV-2 public health emergency.  Safety protocols were in place, including screening questions prior to the visit, additional usage of staff PPE, and extensive cleaning of exam room while observing appropriate contact time as indicated for disinfecting solutions.

## 2022-05-04 NOTE — Progress Notes (Deleted)
Office Visit Note  Patient: Cheryl Austin             Date of Birth: 05/17/1941           MRN: [id number]             PCP: [name], MD Referring: [name], MD Visit Date: 05/18/2022 Occupation: @GUAROCC @  Subjective:    History of Present Illness: Cheryl Austin is a 81 y.o. female with history of polymyalgia rheumatica and osteoarthritis.  Patient is not currently taking any prednisone or immunosuppressive agents.   Patient remains on Fosamax 70 mg 1 tablet by mouth once weekly for management of osteoporosis.  She will be due to update a bone density in March 2024.  Activities of Daily Living:  Patient reports morning stiffness for *** {minute/hour:19697}.   Patient {ACTIONS;DENIES/REPORTS:21021675::"Denies"} nocturnal pain.  Difficulty dressing/grooming: {ACTIONS;DENIES/REPORTS:21021675::"Denies"} Difficulty climbing stairs: {ACTIONS;DENIES/REPORTS:21021675::"Denies"} Difficulty getting out of chair: {ACTIONS;DENIES/REPORTS:21021675::"Denies"} Difficulty using hands for taps, buttons, cutlery, and/or writing: {ACTIONS;DENIES/REPORTS:21021675::"Denies"}  No Rheumatology ROS completed.   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   Spondylosis of cervical region without myelopathy or radiculopathy 05/29/2018   Family history of Parkinson disease 05/21/2016   Seborrheic dermatitis 11/02/2014   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   Moderate episode of recurrent major depressive disorder (HCC) 06/22/2013    Dermatitis, atopic 01/30/2013   Menopausal hot flushes 01/30/2013   MRSA cellulitis 01/30/2013   Osteoarthritis, 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 10-18-1977   Restless legs syndrome (RLS) 08/02/2017   Spinal stenosis, lumbar region without neurogenic claudication 10/23/2019   By lumbar MRI 06/2019, mild   Thyroid disease    Hypothyroid    Family History  Problem Relation Age of Onset   Lupus Mother    Asthma Mother    Parkinson's disease Mother    Depression Mother    Heart disease Father    Hyperlipidemia Father    Stroke Father    Alcohol abuse Daughter    Depression Daughter    Past Surgical History:  Procedure Laterality Date   APPENDECTOMY  1961   BREAST SURGERY Bilateral 1987 1998   Lumpectomy   EYE SURGERY     TOTAL ABDOMINAL HYSTERECTOMY  1986   BSO/Fibroids   Social History   Social History Narrative   Drinks 2-3 caffeine drinks a day    Immunization History  Administered Date(s) Administered   DT (Pediatric) 05/21/2008   Fluad Quad(high Dose 65+) 04/27/2020, 06/08/2021, 05/02/2022   Influenza Split 06/20/2010, 05/14/2011, 06/18/2012, 06/22/2013, 07/02/2014, 07/05/2015, 05/21/2016,  05/29/2018   Influenza, High Dose Seasonal PF 06/18/2012, 06/18/2012, 06/22/2013, 06/22/2013, 07/02/2014, 07/02/2014, 07/05/2015, 07/05/2015, 05/21/2016, 05/21/2016, 08/02/2017, 04/27/2020   Influenza, Seasonal, Injecte, Preservative Fre 05/14/2011   Influenza,inj,Quad PF,6+ Mos  05/21/2016, 05/29/2018   Influenza,trivalent, recombinat, inj, PF 05/14/2011   Influenza-Unspecified 05/14/2011, 06/18/2012, 06/22/2013, 07/02/2014, 07/05/2015   PFIZER Comirnaty(Gray Top)Covid-19 Tri-Sucrose Vaccine 10/11/2019, 11/04/2019   PFIZER(Purple Top)SARS-COV-2 Vaccination 10/11/2019, 11/04/2019   Pneumococcal Conjugate-13 07/04/2014, 07/04/2014   Pneumococcal Polysaccharide-23 03/20/2005, 06/22/2011   Pneumococcal-Unspecified 03/20/2005, 06/22/2011   Td 05/21/2008   Tdap 05/21/2008, 06/22/2011, 06/19/2013     Objective: Vital Signs: There were no vitals taken for this visit.   Physical Exam Vitals and nursing note reviewed.  Constitutional:      Appearance: She is well-developed.  HENT:     Head: Normocephalic and atraumatic.  Eyes:     Conjunctiva/sclera: Conjunctivae normal.  Cardiovascular:     Rate and Rhythm: Normal rate and regular rhythm.     Heart sounds: Normal heart sounds.  Pulmonary:     Effort: Pulmonary effort is normal.     Breath sounds: Normal breath sounds.  Abdominal:     General: Bowel sounds are normal.     Palpations: Abdomen is soft.  Musculoskeletal:     Cervical back: Normal range of motion.  Lymphadenopathy:     Cervical: No cervical adenopathy.  Skin:    General: Skin is warm and dry.     Capillary Refill: Capillary refill takes less than 2 seconds.  Neurological:     Mental Status: She is alert and oriented to person, place, and time.  Psychiatric:        Behavior: Behavior normal.      Musculoskeletal Exam: ***  CDAI Exam: CDAI Score: -- Patient Global: --; Provider Global: -- Swollen: --; Tender: -- Joint Exam 05/18/2022   No joint exam has been documented for this visit   There is currently no information documented on the homunculus. Go to the Rheumatology activity and complete the homunculus joint exam.  Investigation: No additional findings.  Imaging: No results found.  Recent Labs: Lab Results  Component  Value Date   WBC 5.1 01/22/2022   HGB 12.9 01/22/2022   PLT 212.0 01/22/2022   NA 132 (L) 04/16/2022   K 4.2 04/16/2022   CL 98 04/16/2022   CO2 25 04/16/2022   GLUCOSE 92 04/16/2022   BUN 15 04/16/2022   CREATININE 0.70 04/16/2022   BILITOT 0.4 04/16/2022   ALKPHOS 72 04/16/2022   AST 21 04/16/2022   ALT 16 04/16/2022   PROT 6.3 04/16/2022   ALBUMIN 4.3 04/16/2022   CALCIUM 9.2 04/16/2022   GFRAA 95 11/10/2020   QFTBGOLDPLUS NEGATIVE 07/06/2019    Speciality Comments: Fosamax started 03/022  Procedures:  No procedures performed Allergies: Patient has no known allergies.   Assessment / Plan:     Visit Diagnoses: No diagnosis found.  Orders: No orders of the defined types were placed in this encounter.  No orders of the defined types were placed in this encounter.   Face-to-face time spent with patient was *** minutes. Greater than 50% of time was spent in counseling and coordination of care.  Follow-Up Instructions: No follow-ups on file.   Ellen Henri, CMA  Note - This record has been created using Animal nutritionist.  Chart creation errors have been sought, but may not always  have been located. Such creation errors do not reflect on  the standard of medical care.

## 2022-05-18 ENCOUNTER — Ambulatory Visit: Payer: Medicare Other | Attending: Physician Assistant | Admitting: Physician Assistant

## 2022-05-18 DIAGNOSIS — M810 Age-related osteoporosis without current pathological fracture: Secondary | ICD-10-CM

## 2022-05-18 DIAGNOSIS — E8809 Other disorders of plasma-protein metabolism, not elsewhere classified: Secondary | ICD-10-CM

## 2022-05-18 DIAGNOSIS — Z8614 Personal history of Methicillin resistant Staphylococcus aureus infection: Secondary | ICD-10-CM

## 2022-05-18 DIAGNOSIS — E559 Vitamin D deficiency, unspecified: Secondary | ICD-10-CM

## 2022-05-18 DIAGNOSIS — Z8639 Personal history of other endocrine, nutritional and metabolic disease: Secondary | ICD-10-CM

## 2022-05-18 DIAGNOSIS — L219 Seborrheic dermatitis, unspecified: Secondary | ICD-10-CM

## 2022-05-18 DIAGNOSIS — Z820 Family history of epilepsy and other diseases of the nervous system: Secondary | ICD-10-CM

## 2022-05-18 DIAGNOSIS — M47816 Spondylosis without myelopathy or radiculopathy, lumbar region: Secondary | ICD-10-CM

## 2022-05-18 DIAGNOSIS — G4709 Other insomnia: Secondary | ICD-10-CM

## 2022-05-18 DIAGNOSIS — Z8269 Family history of other diseases of the musculoskeletal system and connective tissue: Secondary | ICD-10-CM

## 2022-05-18 DIAGNOSIS — M47812 Spondylosis without myelopathy or radiculopathy, cervical region: Secondary | ICD-10-CM

## 2022-05-18 DIAGNOSIS — Z79899 Other long term (current) drug therapy: Secondary | ICD-10-CM

## 2022-05-18 DIAGNOSIS — M19041 Primary osteoarthritis, right hand: Secondary | ICD-10-CM

## 2022-05-18 DIAGNOSIS — M353 Polymyalgia rheumatica: Secondary | ICD-10-CM

## 2022-05-18 DIAGNOSIS — Z8659 Personal history of other mental and behavioral disorders: Secondary | ICD-10-CM

## 2022-05-30 ENCOUNTER — Encounter: Payer: Self-pay | Admitting: Family Medicine

## 2022-05-30 ENCOUNTER — Ambulatory Visit: Payer: Medicare Other | Admitting: Family Medicine

## 2022-05-30 VITALS — BP 108/60 | HR 65 | Temp 98.7°F | Ht 63.5 in | Wt 113.6 lb

## 2022-05-30 DIAGNOSIS — R053 Chronic cough: Secondary | ICD-10-CM | POA: Diagnosis not present

## 2022-05-30 DIAGNOSIS — B9689 Other specified bacterial agents as the cause of diseases classified elsewhere: Secondary | ICD-10-CM

## 2022-05-30 DIAGNOSIS — U071 COVID-19: Secondary | ICD-10-CM | POA: Diagnosis not present

## 2022-05-30 DIAGNOSIS — J208 Acute bronchitis due to other specified organisms: Secondary | ICD-10-CM

## 2022-05-30 LAB — POC COVID19 BINAXNOW: SARS Coronavirus 2 Ag: POSITIVE — AB

## 2022-05-30 MED ORDER — GUAIFENESIN-CODEINE 100-10 MG/5ML PO SOLN: 5.0000 mL | Freq: Four times a day (QID) | ORAL | 0 refills | Status: DC | PRN

## 2022-05-30 MED ORDER — AZITHROMYCIN 250 MG PO TABS: ORAL_TABLET | ORAL | 0 refills | Status: DC

## 2022-05-30 NOTE — Addendum Note (Signed)
Addended by: Darlina Rumpf A on: 05/30/2022 03:05 PM   Modules accepted: Orders

## 2022-05-30 NOTE — Progress Notes (Addendum)
Subjective  CC:  Chief Complaint  Patient presents with   Cough    Pt has had a cough for the past weeks and it has not gotten better    HPI: SUBJECTIVE:  Cheryl Austin is a 81 y.o. female who complains of congestion, nasal blockage, post nasal drip, cough described as harsh and productive and denies sinus, high fevers, SOB, chest pain or significant GI symptoms. Symptoms have been present for almost 2 weeks and started when she was traveling to [place] on a cruise ship. Started with sxs of infection: st and congestion. But won't resolve. And has h/o chronic cough. She denies a history of anorexia, dizziness, vomiting and wheezing. She denies a history of asthma or COPD. Patient does not smoke cigarettes.  Assessment  1. Acute bacterial bronchitis   2. Chronic cough      Plan  Discussion:  Treat for bacterial bronchitis due to prolonged course and worsening symptoms. Education regarding differences between viral and bacterial infections and treatment options are discussed.  Supportive care measures are recommended.  We discussed the use of mucolytic's, decongestants, antihistamines and antitussives as needed.  Tylenol or Advil are recommended if needed.  After patient left: covid test turned positive. Called and notified. Outside of window for antiviral. Will treat as above to cover for bacterial infection, secondary.   Follow up: prn   No orders of the defined types were placed in this encounter.  Meds ordered this encounter  Medications   azithromycin (ZITHROMAX) 250 MG tablet    Sig: Take 2 tabs today, then 1 tab daily for 4 days    Dispense:  1 each    Refill:  0   guaiFENesin-codeine 100-10 MG/5ML syrup    Sig: Take 5 mLs by mouth every 6 (six) hours as needed for cough.    Dispense:  120 mL    Refill:  0      I reviewed the patients updated PMH, FH, and SocHx.  Social History: Patient  reports that she has never smoked. She has never used smokeless tobacco. She  reports current alcohol use of about 2.0 standard drinks of alcohol per week. She reports that she does not use drugs.  Patient Active Problem List   Diagnosis Date Noted   Paroxysmal SVT (supraventricular tachycardia) 01/11/2022    Priority: High   Polymyalgia rheumatica ([place]) 10/23/2019    Priority: High   Seborrheic dermatitis 11/02/2014    Priority: High   Moderate episode of recurrent major depressive disorder ([place]) 06/22/2013    Priority: High   MRSA cellulitis 01/30/2013    Priority: High   Hypothyroidism 12/18/2010    Priority: High   GAD (generalized anxiety disorder) 10/26/2020    Priority: Medium    MRSA nasal colonization 10/26/2020    Priority: Medium    Spinal stenosis, lumbar region without neurogenic claudication 10/23/2019    Priority: Medium    Lumbar facet arthropathy 10/23/2019    Priority: Medium    Spondylosis of cervical region without myelopathy or radiculopathy 05/29/2018    Priority: Medium    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    Priority: Medium    Dermatitis, atopic 01/30/2013    Priority: Medium    Osteoarthritis, 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    Priority: Medium    Insomnia 03/18/2012    Priority: Medium    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    Priority: Low   Family history of Parkinson disease 05/21/2016    Priority: Low   Menopausal hot  flushes 01/30/2013    Priority: Low   Chronic cough 05/02/2022   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    Objective  Vitals: BP 108/60   Pulse 65   Temp 98.7 F (37.1 C)    Ht 5' 3.5" (1.613 m)   Wt 113 lb 9.6 oz (51.5 kg)   SpO2 97%   BMI 19.81 kg/m  General: no acute distress  Psych:  Alert and oriented, normal mood and affect HEENT:  Normocephalic, atraumatic, supple neck, moist mucous membranes, mildly erythematous pharynx without exudate, mild lymphadenopathy, supple neck Cardiovascular:  RRR without murmur. no edema Respiratory:  Good breath sounds bilaterally, CTAB with normal respiratory effort with occasional rhonchi  Commons side effects, risks, benefits, and alternatives for medications and treatment plan prescribed today were discussed, and the patient expressed understanding of the given instructions. Patient is instructed to call or message via MyChart if he/she has any questions or concerns regarding our treatment plan. No barriers to understanding were identified. We discussed Red Flag symptoms and signs in detail. Patient expressed understanding regarding what to do in case of urgent or emergency type symptoms.  Medication list was reconciled, printed and provided to the patient in AVS. Patient instructions and summary information was reviewed with the patient as documented in the AVS. This note was prepared with assistance of Dragon voice recognition software. Occasional wrong-word or sound-a-like substitutions may have occurred due to the inherent limitations of voice recognition software

## 2022-07-25 ENCOUNTER — Ambulatory Visit: Payer: Medicare Other | Admitting: Physician Assistant

## 2022-07-25 ENCOUNTER — Encounter: Payer: Self-pay | Admitting: Physician Assistant

## 2022-07-25 VITALS — BP 130/80 | HR 70 | Temp 98.2°F | Ht 63.5 in | Wt 114.4 lb

## 2022-07-25 DIAGNOSIS — J029 Acute pharyngitis, unspecified: Secondary | ICD-10-CM

## 2022-07-25 LAB — POC COVID19 BINAXNOW: SARS Coronavirus 2 Ag: NEGATIVE

## 2022-07-25 LAB — POCT RAPID STREP A (OFFICE): Rapid Strep A Screen: POSITIVE — AB

## 2022-07-25 MED ORDER — AMOXICILLIN-POT CLAVULANATE 875-125 MG PO TABS: 1.0000 | ORAL_TABLET | Freq: Two times a day (BID) | ORAL | 0 refills | Status: DC

## 2022-07-25 MED ORDER — GUAIFENESIN-CODEINE 100-10 MG/5ML PO SYRP: 5.0000 mL | ORAL_SOLUTION | Freq: Three times a day (TID) | ORAL | 0 refills | Status: DC | PRN

## 2022-07-25 NOTE — Patient Instructions (Signed)
It was great to see you!  Strep test is positive  Please start augmentin antibiotic  Cough syrup also sent in  Follow-up if any new/worsening symptoms!

## 2022-07-25 NOTE — Progress Notes (Signed)
Cheryl Austin is a 81 y.o. female here for a new problem.  History of Present Illness:   Chief Complaint  Patient presents with   Cough    Pt c/o cough, expectorating yellow sputum, sore throat. No fever, started 5 days ago.    HPI  Cough Patient is complaining of a productive cough with yellow sputum and a sore throat occurring 5 days ago. She slept fine yesterday, but states that she has been having trouble sleeping because of her cough. Patient manages her symptoms with aspirin and cough syrup. The cough syrup doesn't help.   She denies ear pain, ear pressure, sinus pressure, and fever.  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 Corliss Skains   Restless legs syndrome (RLS) 08/02/2017   Spinal stenosis, lumbar region without neurogenic claudication 10/23/2019   By lumbar MRI 06/2019, mild   Thyroid disease    Hypothyroid     Social History   Tobacco Use   Smoking status: Never   Smokeless tobacco: Never  Vaping Use   Vaping Use: Never used  Substance Use Topics   Alcohol use: Yes    Alcohol/week: 2.0 standard drinks of alcohol    Types: 2 Shots of liquor per week   Drug use: No    Past Surgical History:  Procedure Laterality Date   APPENDECTOMY  1961   BREAST SURGERY Bilateral 1987 1998   Lumpectomy   EYE SURGERY     TOTAL ABDOMINAL HYSTERECTOMY  1986   BSO/Fibroids    Family History  Problem Relation Age of Onset   Lupus Mother    Asthma Mother    Parkinson's disease Mother    Depression Mother    Heart disease Father    Hyperlipidemia  Father    Stroke Father    Alcohol abuse Daughter    Depression Daughter     No Known Allergies  Current Medications:   Current Outpatient Medications:    amoxicillin-clavulanate (AUGMENTIN) 875-125 MG tablet, Take 1 tablet by mouth 2 (two) times daily., Disp: 20 tablet, Rfl: 0   betamethasone dipropionate 0.05 % cream, APPLY TO AFFECTED AREA TWICE A DAY, Disp: 45 g, Rfl: 0   guaiFENesin-codeine (ROBITUSSIN AC) 100-10 MG/5ML syrup, Take 5 mLs by mouth 3 (three) times daily as needed for cough., Disp: 120 mL, Rfl: 0   levothyroxine (SYNTHROID) 50 MCG tablet, TAKE 1 TABLET BY MOUTH EVERY DAY, Disp: 90 tablet, Rfl: 3   LORazepam (ATIVAN) 0.5 MG tablet, Take 1-2 tablets by mouth daily as needed., Disp: 30 tablet, Rfl: 2   sertraline (ZOLOFT) 50 MG tablet, Take 1.5 tablets (75 mg total) by mouth daily., Disp: 90 tablet, Rfl: 3   traZODone (DESYREL) 100 MG tablet, Take 100 mg by mouth as needed for sleep., Disp: , Rfl:    fluticasone (FLONASE) 50 MCG/ACT nasal spray, Place 1 spray into both nostrils daily. (Patient not taking: Reported on 07/25/2022), Disp: 16 g, Rfl: 6   Review of Systems:   Review of Systems  Constitutional:  Negative  for fever.  HENT:  Positive for sore throat. Negative for ear pain and sinus pain.   Respiratory:  Positive for cough (yellow sputum).     Vitals:   Vitals:   07/25/22 1143  BP: 130/80  Pulse: 70  Temp: 98.2 F (36.8 C)  TempSrc: Temporal  SpO2: 98%  Weight: 114 lb 6.1 oz (51.9 kg)  Height: 5' 3.5" (1.613 m)     Body mass index is 19.94 kg/m.  Physical Exam:   Physical Exam Constitutional:      General: She is not in acute distress.    Appearance: Normal appearance. She is not ill-appearing.  HENT:     Head: Normocephalic and atraumatic.     Right Ear: External ear normal.     Left Ear: External ear normal.     Mouth/Throat:     Pharynx: Posterior oropharyngeal erythema present.  Eyes:     Extraocular Movements: Extraocular movements  intact.     Pupils: Pupils are equal, round, and reactive to light.  Cardiovascular:     Rate and Rhythm: Normal rate and regular rhythm.     Heart sounds: Normal heart sounds. No murmur heard.    No gallop.  Pulmonary:     Effort: Pulmonary effort is normal. No respiratory distress.     Breath sounds: Normal breath sounds. No wheezing or rales.  Skin:    General: Skin is warm and dry.  Neurological:     Mental Status: She is alert and oriented to person, place, and time.  Psychiatric:        Judgment: Judgment normal.    Results for orders placed or performed in visit on 07/25/22  POCT rapid strep A  Result Value Ref Range   Rapid Strep A Screen Positive (A) Negative  POC COVID-19  Result Value Ref Range   SARS Coronavirus 2 Ag Negative Negative    Assessment and Plan:   Sore throat Strep test positive, COVID test negative No red flags on exam.  Will initiate Augmentin given increasingly worsening sinusitis symptoms per orders. Discussed taking medications as prescribed. Reviewed return precautions including worsening fever, SOB, worsening cough or other concerns. Push fluids and rest. I recommend that patient follow-up if symptoms worsen or persist despite treatment x 7-10 days, sooner if needed.  I,Verona Buck,acting as a Neurosurgeon for [employer], PA.,have documented all relevant documentation on the behalf of Jarold Motto, PA,as directed by  Jarold Motto, PA while in the presence of Jarold Motto, [place].  I, Jarold Motto, [place], have reviewed all documentation for this visit. The documentation on 07/25/22 for the exam, diagnosis, procedures, and orders are all accurate and complete.   Jarold Motto, PA-C

## 2022-07-30 ENCOUNTER — Telehealth: Payer: Self-pay | Admitting: Family Medicine

## 2022-07-30 NOTE — Telephone Encounter (Signed)
Patient states: -Following 12/06 OV and strept dx she has been having diarrhea for the past week  - She is wondering if it's related to the strept diagnosis   Patient has been scheduled for an OV on 12/13 @ 9:30am. She would like to know if this could be virtually. Please advise.

## 2022-07-31 ENCOUNTER — Ambulatory Visit: Payer: Medicare Other | Admitting: Family Medicine

## 2022-07-31 ENCOUNTER — Other Ambulatory Visit: Payer: Self-pay

## 2022-07-31 NOTE — Telephone Encounter (Signed)
Spoke with pt regarding diarrhea and she has been advised of new recommendations and new Rx has been sent to pharmacy.

## 2022-08-01 ENCOUNTER — Ambulatory Visit: Payer: Medicare Other | Admitting: Family Medicine

## 2022-08-24 ENCOUNTER — Encounter: Payer: Medicare Other | Admitting: Family Medicine

## 2022-09-22 IMAGING — MR MR LUMBAR SPINE W/O CM
4 of 5 series · 18 of 48 positions shown · non-contrast
Comparison: 06/08/2019

CLINICAL DATA: Lumbar spinal stenosis without neurogenic
claudication. Low back pain. Pain radiates to the right hip.

EXAM:
MRI LUMBAR SPINE WITHOUT CONTRAST
TECHNIQUE: Multiplanar, multisequence MR imaging of the lumbar spine was
performed. No intravenous contrast was administered.

[Series 5: T2 · sagittal · 4.0mm · 0.73mm/px · 6 of 15 slices shown (1 of 2)]
[im 1/15]
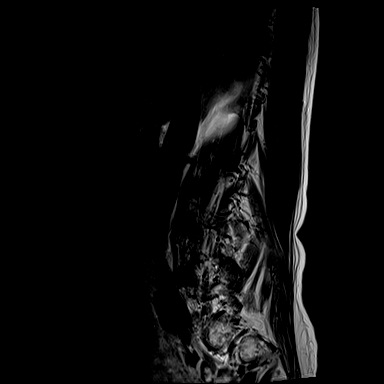
[im 3/15]
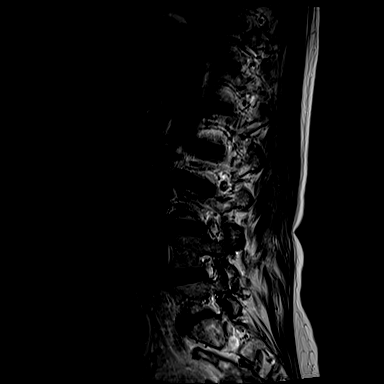
[im 6/15]
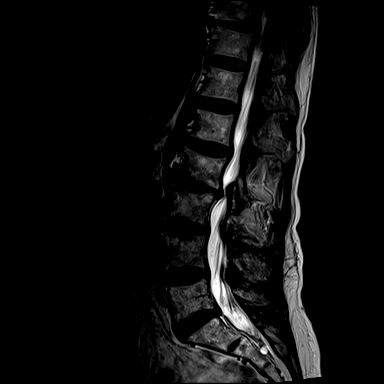
[im 9/15]
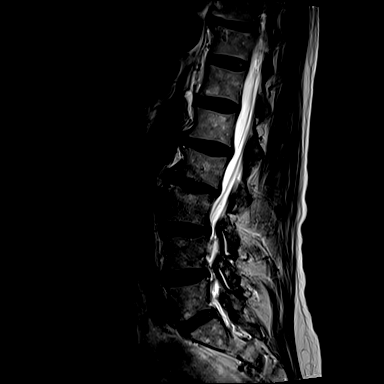
[im 12/15]
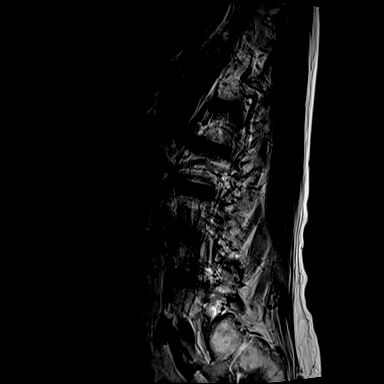
[im 15/15]
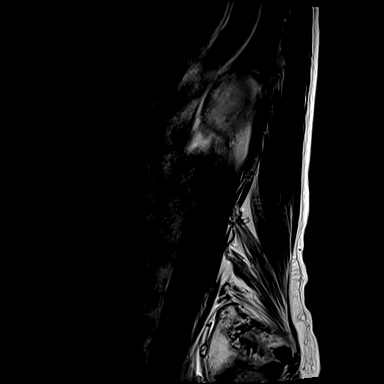

[Series 6: T1 · sagittal · 4.0mm · 0.73mm/px · 3 of 15 slices shown (1 of 2)]
[im 3/15]
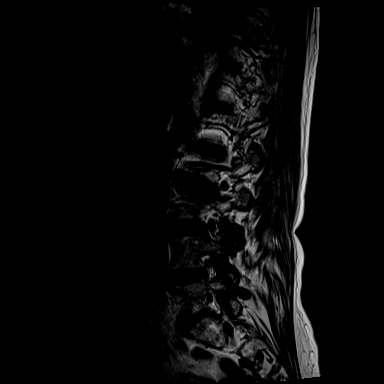
[im 9/15]
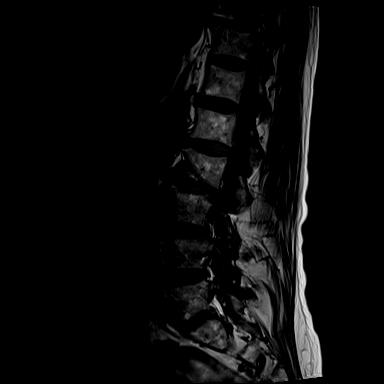
[im 15/15]
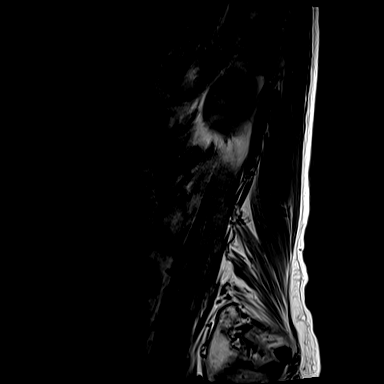

[Series 10: T1 · axial · 4.0mm · 0.28mm/px · z∈[-116,+40]mm · 3 of 41 slices shown (2 of 2)]
[im 6/41]
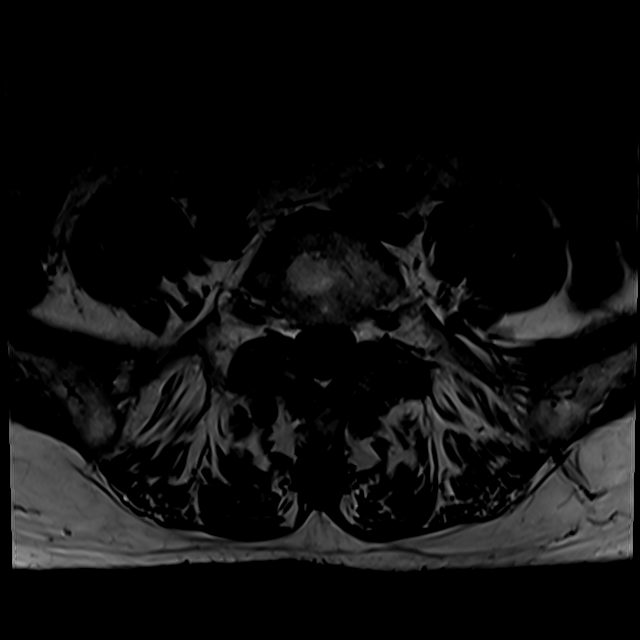
[im 21/41]
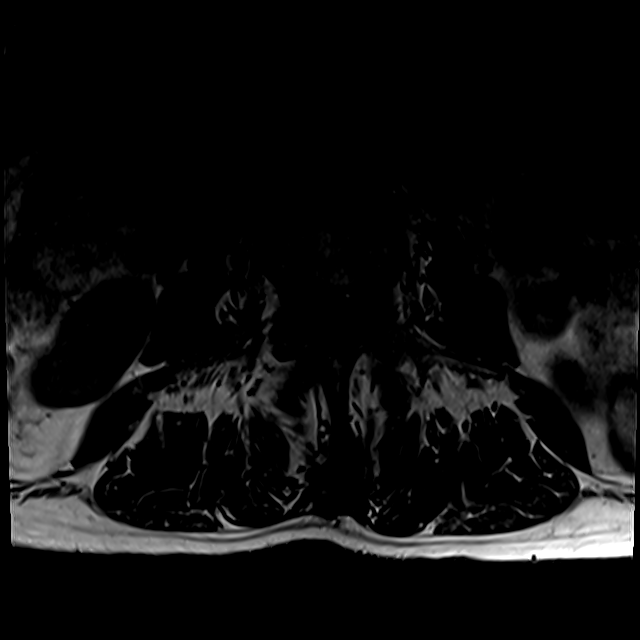
[im 35/41]
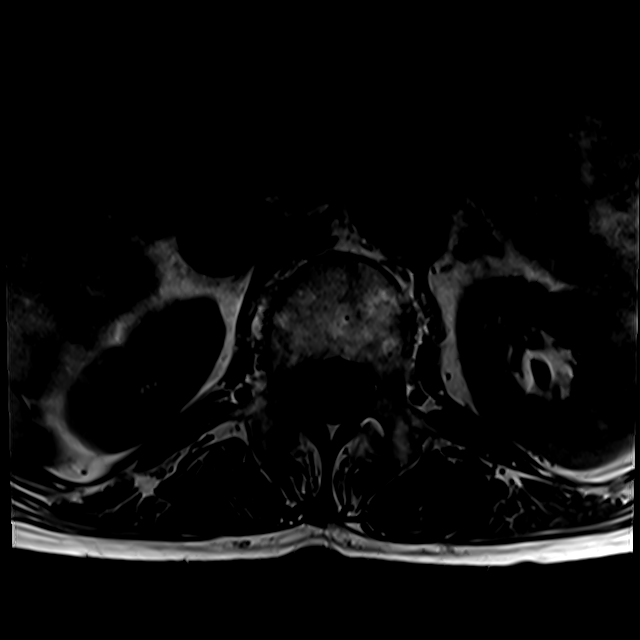

[Series 13: T2 · axial · 4.0mm · 0.28mm/px · z∈[-141,+40]mm · 6 of 41 slices shown (2 of 2)]
[im 1/41]
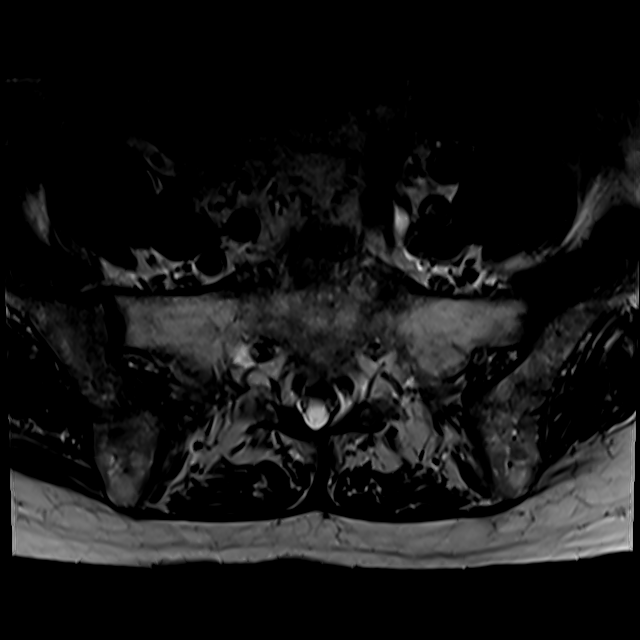
[im 6/41]
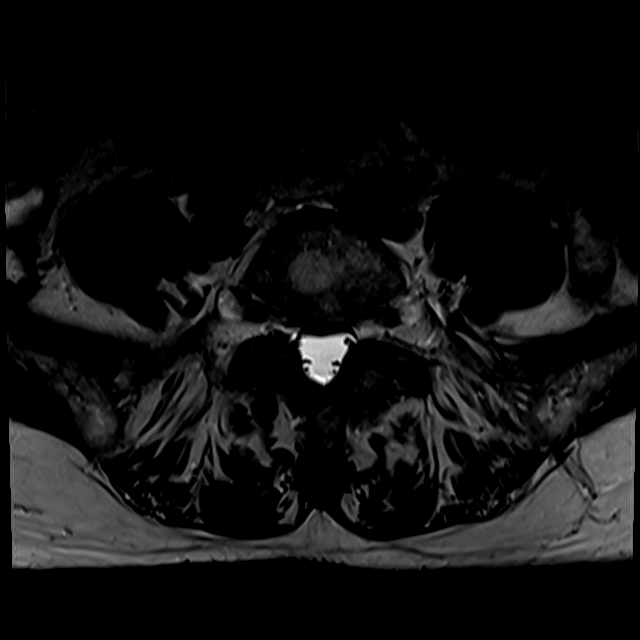
[im 12/41]
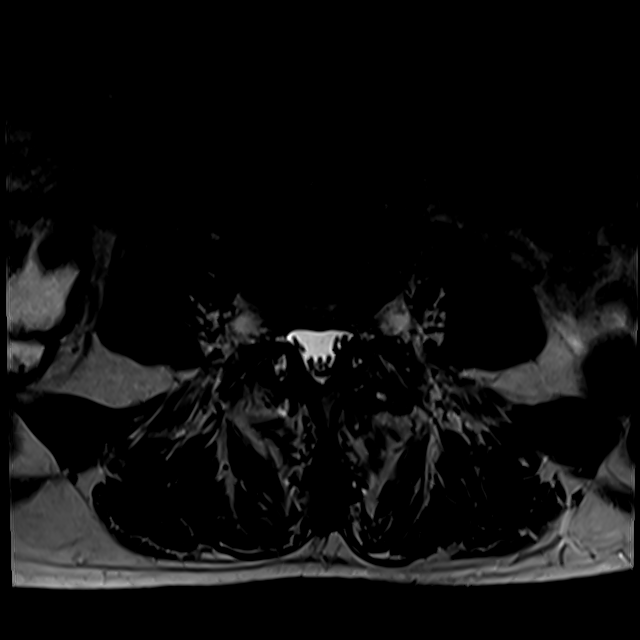
[im 18/41]
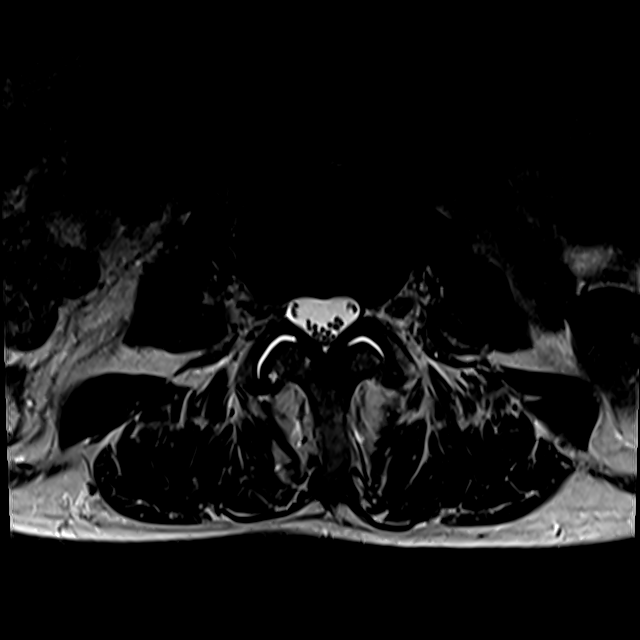
[im 21/41]
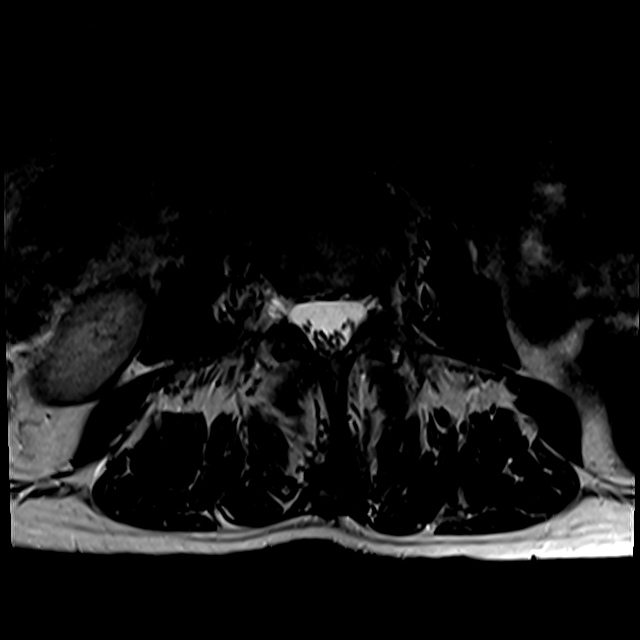
[im 35/41]
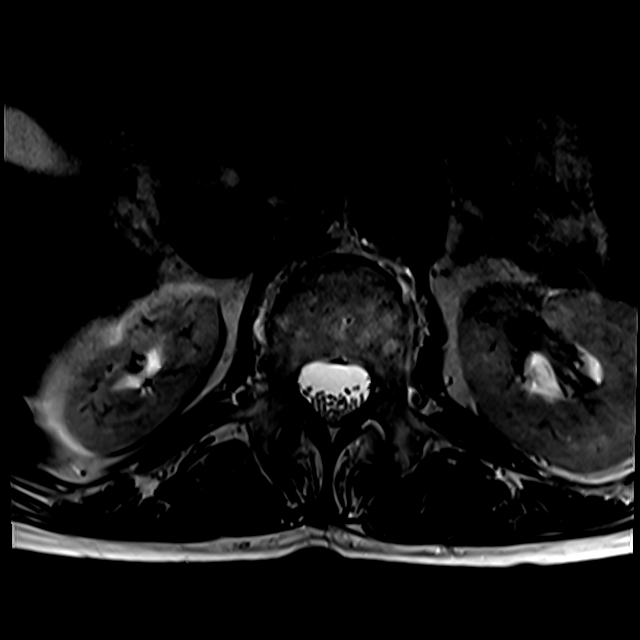

[18 of 48 positions shown; findings below may reference images not displayed]

FINDINGS: Segmentation:  5 lumbar type vertebral bodies.

Alignment:  2 mm degenerative anterolisthesis L2-3.

Vertebrae: Discogenic endplate changes at L2-3 with fatty change and
some edema on the right. Findings could relate to regional back
pain.

Conus medullaris and cauda equina: Conus extends to the L1 level.
Conus and cauda equina appear normal.

Paraspinal and other soft tissues: Negative

Disc levels:

Minimal non-compressive disc bulges at T10-11, T12-L1 and L1-2. No
likely symptomatic finding at those levels.

L2-3: Chronic and progressive disc degeneration. 2 mm of
retrolisthesis. Desiccation of the disc. Loss of disc height.
Circumferential protrusion of the disc. Bilateral facet effusions.
Stenosis of the right lateral recess and intervertebral foramen on
the right that could possibly cause right-sided neural compression.

L3-4: Mild bulging of the disc. Bilateral facet degeneration and
hypertrophy. No compressive stenosis. The facet arthritis could be
painful.

L4-5: Mild bulging of the disc. Bilateral facet degeneration and
hypertrophy. No compressive stenosis. The facet arthritis could be
painful.

L5-S1: Mild bulging of the disc. Mild facet osteoarthritis. No
stenosis.
IMPRESSION: Progressive disc degeneration at L2-3. Further loss of disc height.
Endplate irregularity and edema. Findings at this level could
certainly relate to regional back pain. There is circumferential
protrusion of the disc more prominent towards the right. There is
facet and ligamentous hypertrophy with 2 mm of retrolisthesis. There
is stenosis of the right lateral recess and foramen on the right
that could cause right-sided neural compression.

Facet osteoarthritis at L3-4 and L4-5 which could be a cause of back
pain or referred facet syndrome pain. No visible neural compression
at those levels. This has worsened since 5151 as well.

## 2022-10-04 ENCOUNTER — Encounter: Payer: Self-pay | Admitting: Family Medicine

## 2022-10-04 ENCOUNTER — Ambulatory Visit (INDEPENDENT_AMBULATORY_CARE_PROVIDER_SITE_OTHER): Payer: Medicare Other | Admitting: Family Medicine

## 2022-10-04 VITALS — BP 128/70 | HR 67 | Temp 97.8°F | Ht 63.5 in | Wt 120.2 lb

## 2022-10-04 DIAGNOSIS — M48061 Spinal stenosis, lumbar region without neurogenic claudication: Secondary | ICD-10-CM

## 2022-10-04 DIAGNOSIS — Z0000 Encounter for general adult medical examination without abnormal findings: Secondary | ICD-10-CM | POA: Diagnosis not present

## 2022-10-04 DIAGNOSIS — Z780 Asymptomatic menopausal state: Secondary | ICD-10-CM

## 2022-10-04 DIAGNOSIS — M818 Other osteoporosis without current pathological fracture: Secondary | ICD-10-CM

## 2022-10-04 DIAGNOSIS — F331 Major depressive disorder, recurrent, moderate: Secondary | ICD-10-CM | POA: Diagnosis not present

## 2022-10-04 DIAGNOSIS — M353 Polymyalgia rheumatica: Secondary | ICD-10-CM | POA: Diagnosis not present

## 2022-10-04 DIAGNOSIS — T380X5A Adverse effect of glucocorticoids and synthetic analogues, initial encounter: Secondary | ICD-10-CM

## 2022-10-04 DIAGNOSIS — E039 Hypothyroidism, unspecified: Secondary | ICD-10-CM

## 2022-10-04 DIAGNOSIS — I471 Supraventricular tachycardia, unspecified: Secondary | ICD-10-CM | POA: Diagnosis not present

## 2022-10-04 LAB — COMPREHENSIVE METABOLIC PANEL
ALT: 18 U/L (ref 0–35)
AST: 26 U/L (ref 0–37)
Albumin: 4.2 g/dL (ref 3.5–5.2)
Alkaline Phosphatase: 76 U/L (ref 39–117)
BUN: 14 mg/dL (ref 6–23)
CO2: 26 mEq/L (ref 19–32)
Calcium: 9.1 mg/dL (ref 8.4–10.5)
Chloride: 102 mEq/L (ref 96–112)
Creatinine, Ser: 0.74 mg/dL (ref 0.40–1.20)
GFR: 76.57 mL/min (ref >60.00)
Glucose, Bld: 84 mg/dL (ref 70–99)
Potassium: 4 mEq/L (ref 3.5–5.1)
Sodium: 136 mEq/L (ref 135–145)
Total Bilirubin: 0.4 mg/dL (ref 0.2–1.2)
Total Protein: 6.4 g/dL (ref 6.0–8.3)

## 2022-10-04 LAB — LIPID PANEL
Cholesterol: 202 mg/dL — ABNORMAL HIGH (ref 0–200)
HDL: 83.1 mg/dL (ref >39.00)
LDL Cholesterol: 105 mg/dL — ABNORMAL HIGH (ref 0–99)
NonHDL: 119
Total CHOL/HDL Ratio: 2
Triglycerides: 69 mg/dL (ref 0.0–149.0)
VLDL: 13.8 mg/dL (ref 0.0–40.0)

## 2022-10-04 LAB — CBC WITH DIFFERENTIAL/PLATELET
Basophils Absolute: 0 10*3/uL (ref 0.0–0.1)
Basophils Relative: 0.7 % (ref 0.0–3.0)
Eosinophils Absolute: 0.1 10*3/uL (ref 0.0–0.7)
Eosinophils Relative: 2.2 % (ref 0.0–5.0)
HCT: 39.5 % (ref 36.0–46.0)
Hemoglobin: 13.3 g/dL (ref 12.0–15.0)
Lymphocytes Relative: 32.4 % (ref 12.0–46.0)
Lymphs Abs: 1.3 10*3/uL (ref 0.7–4.0)
MCHC: 33.7 g/dL (ref 30.0–36.0)
MCV: 86.6 fl (ref 78.0–100.0)
Monocytes Absolute: 0.3 10*3/uL (ref 0.1–1.0)
Monocytes Relative: 8.2 % (ref 3.0–12.0)
Neutro Abs: 2.2 10*3/uL (ref 1.4–7.7)
Neutrophils Relative %: 56.5 % (ref 43.0–77.0)
Platelets: 228 10*3/uL (ref 150.0–400.0)
RBC: 4.55 Mil/uL (ref 3.87–5.11)
RDW: 14.3 % (ref 11.5–15.5)
WBC: 3.9 10*3/uL — ABNORMAL LOW (ref 4.0–10.5)

## 2022-10-04 LAB — SEDIMENTATION RATE: Sed Rate: 5 mm/hr (ref 0–30)

## 2022-10-04 LAB — TSH: TSH: 0.61 u[IU]/mL (ref 0.35–5.50)

## 2022-10-04 LAB — HEMOGLOBIN A1C: Hgb A1c MFr Bld: 5.6 % (ref 4.6–6.5)

## 2022-10-04 MED ORDER — IBANDRONATE SODIUM 150 MG PO TABS: 150.0000 mg | ORAL_TABLET | ORAL | 3 refills | Status: DC

## 2022-10-04 NOTE — Patient Instructions (Signed)
Please return in 12 months for your annual complete physical; please come fasting.   I will release your lab results to you on your MyChart account with further instructions. You may see the results before I do, but when I review them I will send you a message with my report or have my assistant call you if things need to be discussed. Please reply to my message with any questions. Thank you!   If you have any questions or concerns, please don't hesitate to send me a message via MyChart or call the office at [phone]. Thank you for visiting with [place] today! It's our pleasure caring for you.   I have ordered a mammogram and/or bone density for you as we discussed today: please start the boniva and let me know if it bothers your stomach. []$   Mammogram  [x]$   Bone Density  Please call the office checked below to schedule your appointment:  [x]$   [facility]      [address] [place], [place]         []$   [facility]  [address] [place], [place]

## 2022-10-04 NOTE — Progress Notes (Signed)
Subjective  Chief Complaint  Patient presents with   Annual Exam    HPI: Cheryl Austin is a 82 y.o. female who presents to [facility] [place] today for a Female Wellness Visit. She also has the concerns and/or needs as listed above in the chief complaint. These will be addressed in addition to the Health Maintenance Visit.   Wellness Visit: annual visit with health maintenance review and exam without Pap  HM: 82 yo... no further mammogram nor colonoscopy needed. Due for dexa, see below. Has sold her home and now in an apartment: had a difficult December due to the stress of it all but is now managing better. Back to exercising. Less anxiety sxs. Chronic disease f/u and/or acute problem visit: (deemed necessary to be done in addition to the wellness visit): Low thyroid on levothyroxine 80mg daily and feels well. No sxs of high or low thyroid.  Depression remains well controlled on sertraline 50daily PMR: remains in remission. No pred for > 160yrhronic back pain persists Osteoporosis: due dexa; took fosamax until about 2-3 months ago due to GI upset.   Assessment  1. Annual physical exam   2. Acquired hypothyroidism   3. Moderate episode of recurrent major depressive disorder ([facility] [place]  4. Polymyalgia rheumatica (HCC)   5. Paroxysmal SVT (supraventricular tachycardia)   6. Steroid-induced osteoporosis   7. Spinal stenosis, lumbar region without neurogenic claudication   8. Asymptomatic menopausal state      Plan  Female Wellness Visit: Age appropriate Health Maintenance and Prevention measures were discussed with patient. Included topics are cancer screening recommendations, ways to keep healthy (see AVS) including dietary and exercise recommendations, regular eye and dental care, use of seat belts, and avoidance of moderate alcohol use and tobacco use.  BMI: discussed patient's BMI and encouraged positive lifestyle modifications to help get to or maintain a  target BMI. HM needs and immunizations were addressed and ordered. See below for orders. See HM and immunization section for updates. Routine labs and screening tests ordered including cmp, cbc and lipids where appropriate. Discussed recommendations regarding Vit D and calcium supplementation (see AVS)  Chronic disease management visit and/or acute problem visit: Low thyroid for recheck today. Clinically stable. On levothyroxine 5065mdaily Depression remains well controlled on sertraline 79m19mily PSVT w/o sxs.  Osteoporosis: repeat dexa; will try boniva 150 q 30 days to see if will tolerate it. Continue d and calcium. Change to prolia if gets GI upset with boniva. Education given.  Low back pain persists; managed by NS PMR: stable off meds x 1 year.   Follow up: 12 mo for cpe  Orders Placed This Encounter  Procedures   DG Bone Density   CBC with Differential/Platelet   Comprehensive metabolic panel   Hemoglobin A1c   Lipid panel   TSH   Sedimentation rate   Meds ordered this encounter  Medications   ibandronate (BONIVA) 150 MG tablet    Sig: Take 1 tablet (150 mg total) by mouth every 30 (thirty) days. Take in the morning with a full glass of water, on an empty stomach, and do not take anything else by mouth or lie down for the next 30 min.    Dispense:  3 tablet    Refill:  3      Body mass index is 20.96 kg/m. Wt Readings from Last 3 Encounters:  10/04/22 120 lb 3.2 oz (54.5 kg)  07/25/22 114 lb 6.1 oz (51.9 kg)  05/30/22 113 lb 9.6 oz (51.5 kg)     Patient Active Problem List   Diagnosis Date Noted   Paroxysmal SVT (supraventricular tachycardia) 01/11/2022    Priority: High    zio monitor 2023, nl echo; prn bb    Polymyalgia rheumatica ([place]) 10/23/2019    Priority: High    Aug 2020, Dr. 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 Worsening. rec restarting. deveshwar eval as well. On fosamax DEXA 07/2018 T = - 2.3 lowest; mild worsening. Started fosamax due to chronic pred 2021. DEXA 07/2105: T =  -1.2, 06/2013; stable from prior. Continue cal and vit d.     Dermatitis, atopic 01/30/2013    Priority: Medium     Dr. 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   Health Maintenance  Topic Date Due   Zoster Vaccines- Shingrix (1 of 2) Never done   Medicare Annual Wellness (AWV)  10/13/2022   DEXA SCAN  10/26/2022   DTaP/Tdap/Td (6 - Td or Tdap) 06/20/2023   Pneumonia Vaccine 29+ Years old  Completed   INFLUENZA VACCINE  Completed   HPV VACCINES  Aged Out   MAMMOGRAM  Discontinued   COVID-19 Vaccine  Discontinued   Immunization History  Administered Date(s) Administered   DT (Pediatric)  05/21/2008   Fluad Quad(high Dose 65+) 04/27/2020, 06/08/2021, 05/02/2022   Influenza Split 06/20/2010, 05/14/2011, 06/18/2012, 06/22/2013, 07/02/2014, 07/05/2015, 05/21/2016, 05/29/2018   Influenza, High Dose Seasonal PF 06/18/2012, 06/18/2012, 06/22/2013, 06/22/2013, 07/02/2014, 07/02/2014, 07/05/2015, 07/05/2015, 05/21/2016, 05/21/2016, 08/02/2017, 04/27/2020   Influenza, Seasonal, Injecte, Preservative Fre 05/14/2011   Influenza,inj,Quad PF,6+ Mos 05/21/2016, 05/29/2018   Influenza,trivalent, recombinat, inj, PF 05/14/2011   Influenza-Unspecified 05/14/2011, 06/18/2012, 06/22/2013, 07/02/2014, 07/05/2015   PFIZER Comirnaty(Gray Top)Covid-19 Tri-Sucrose Vaccine 10/11/2019, 11/04/2019   PFIZER(Purple Top)SARS-COV-2 Vaccination 10/11/2019, 11/04/2019   Pneumococcal Conjugate-13 07/04/2014, 07/04/2014   Pneumococcal Polysaccharide-23 03/20/2005, 06/22/2011   Pneumococcal-Unspecified 03/20/2005, 06/22/2011   Td 05/21/2008   Tdap 05/21/2008, 06/22/2011, 06/19/2013   We updated and reviewed the patient's past history in detail and it is documented  below. Allergies: Patient has No Known Allergies. Past Medical History Patient  has a past medical history of Anxiety, Chronic cough (05/02/2022), Cutaneous lupus erythematosus, Family history of Parkinson disease (05/21/2016), GERD (gastroesophageal reflux disease), Kidney failure, Moderate episode of recurrent major depressive disorder ([place]), Osteoporosis, Polymyalgia rheumatica ([place]) (10/23/2019), Restless legs syndrome (RLS) (08/02/2017), Spinal stenosis, lumbar region without neurogenic claudication (10/23/2019), and Thyroid disease. Past Surgical History Patient  has a past surgical history that includes Total abdominal hysterectomy (1986); Appendectomy (1961); Eye surgery; and Breast surgery (Bilateral, 1987 1998). Family History: Patient family history includes Alcohol abuse in her daughter; Asthma in her mother; Depression in her daughter and  mother; Heart disease in her father; Hyperlipidemia in her father; Lupus in her mother; Parkinson's disease in her mother; Stroke in her father. Social History:  Patient  reports that she has never smoked. She has never used smokeless tobacco. She reports current alcohol use of about 2.0 standard drinks of alcohol per week. She reports that she does not use drugs.  Review of Systems: Constitutional: negative for fever or malaise Ophthalmic: negative for photophobia, double vision or loss of vision Cardiovascular: negative for chest pain, dyspnea on exertion, or new LE swelling Respiratory: negative for SOB or persistent cough Gastrointestinal: negative for abdominal pain, change in bowel habits or melena Genitourinary: negative for dysuria or gross hematuria, no abnormal uterine bleeding or disharge Musculoskeletal: negative for new gait disturbance or muscular weakness Integumentary: negative for new or persistent rashes, no breast lumps Neurological: negative for TIA or stroke symptoms Psychiatric: negative for SI or delusions Allergic/Immunologic: negative for hives  Patient Care Team    Relationship Specialty Notifications Start End  Leamon Arnt, MD PCP - General Family Medicine  06/11/16   Lyndee Hensen, PT Physical Therapist Physical Therapy  03/31/19     Objective  Vitals: BP 128/70   Pulse 67   Temp 97.8 F (36.6 C) (Temporal)   Ht 5' 3.5" (1.613 m)   Wt 120 lb 3.2 oz (54.5 kg)   SpO2 97%   BMI 20.96 kg/m  General:  Well developed, well nourished, no acute distress  Psych:  Alert and orientedx3,normal mood and affect HEENT:  Normocephalic, atraumatic, non-icteric sclera,  supple neck without adenopathy, mass or thyromegaly Cardiovascular:  Normal S1, S2, RRR without gallop, rub or murmur Respiratory:  Good breath sounds bilaterally, CTAB with normal respiratory effort Gastrointestinal: normal bowel sounds, soft, non-tender, no noted masses. No HSM MSK: no  deformities, contusions. Joints are without erythema or swelling.  Skin:  Warm, no rashes or suspicious lesions noted Neurologic:    Mental status is normal. CN 2-11 are normal. Gross motor and sensory exams are normal. Normal gait. No tremor   Commons side effects, risks, benefits, and alternatives for medications and treatment plan prescribed today were discussed, and the patient expressed understanding of the given instructions. Patient is instructed to call or message via MyChart if he/she has any questions or concerns regarding our treatment plan. No barriers to understanding were identified. We discussed Red Flag symptoms and signs in detail. Patient expressed understanding regarding what to do in case of urgent or emergency type symptoms.  Medication list was reconciled, printed and provided to the patient in AVS. Patient instructions and summary information was reviewed with the patient as documented in the AVS. This note was prepared with assistance of Dragon voice recognition software. Occasional wrong-word or sound-a-like substitutions may have occurred due to the inherent limitations of voice recognition software

## 2022-10-09 ENCOUNTER — Telehealth: Payer: Self-pay | Admitting: Family Medicine

## 2022-10-09 NOTE — Telephone Encounter (Signed)
Contacted [facility] Pulley to schedule their annual wellness visit. Patient declined to schedule AWV at this time.  Per patient someone from [facility] is doing a home AWV.    [place] Direct Dial [phone]

## 2022-10-11 ENCOUNTER — Encounter: Payer: Medicare Other | Admitting: Family Medicine

## 2022-12-12 ENCOUNTER — Other Ambulatory Visit: Payer: Self-pay | Admitting: Family Medicine

## 2022-12-12 DIAGNOSIS — Z780 Asymptomatic menopausal state: Secondary | ICD-10-CM

## 2022-12-12 DIAGNOSIS — M818 Other osteoporosis without current pathological fracture: Secondary | ICD-10-CM

## 2022-12-17 ENCOUNTER — Other Ambulatory Visit: Payer: Medicare Other

## 2022-12-26 ENCOUNTER — Ambulatory Visit: Payer: Medicare Other | Admitting: Family Medicine

## 2022-12-26 ENCOUNTER — Encounter: Payer: Self-pay | Admitting: Family Medicine

## 2022-12-26 VITALS — BP 138/88 | HR 69 | Temp 98.4°F | Ht 63.5 in | Wt 119.0 lb

## 2022-12-26 DIAGNOSIS — M47816 Spondylosis without myelopathy or radiculopathy, lumbar region: Secondary | ICD-10-CM

## 2022-12-26 DIAGNOSIS — R030 Elevated blood-pressure reading, without diagnosis of hypertension: Secondary | ICD-10-CM

## 2022-12-26 DIAGNOSIS — G44209 Tension-type headache, unspecified, not intractable: Secondary | ICD-10-CM | POA: Diagnosis not present

## 2022-12-26 DIAGNOSIS — H9313 Tinnitus, bilateral: Secondary | ICD-10-CM | POA: Diagnosis not present

## 2022-12-26 NOTE — Progress Notes (Signed)
Subjective  CC:  Chief Complaint  Patient presents with   Blood pressure issues    Pt stated that she has been monitoring her Bp and have received some very high numbers which has been causing some headaches and, head ringing along with some dizziness and stomach pain    HPI: Cheryl Austin is a 82 y.o. female who presents to the office today to address the problems listed above in the chief complaint. Pt felt poorly 2 days ago: left temporal headache, stomach ache and low back pain flare; also took bp and it was very high: 180s/90s. Over the next 3-4 hours, her symptoms resolved on their own and bp normalized to 140/88. Had a few more high readings yesterday and today was 140/90. No fevers, chills, cough; but has had high stress and some intermittent headaches; frontal w/o associated neurologic sxs. Hasn't needed pain meds. No h/o htn. Is living in an apartment and looking for a new home; also had dealt with loss of brother due to cirrhosis, alcohol related, in January and brother was recently diagnosed with parkinsons. Also, had 81th birthday ... Aging is stressful. She lives alone and worries about her future.  Ros is negative for pain with chewing, or brushing hair. No headache now. No chest pain.  + for tinnitus and ? Hearing loss Reports mood is stable  Assessment  1. Elevated blood pressure reading without diagnosis of hypertension   2. Tension headache   3. Lumbar facet arthropathy   4. Tinnitus of both ears      Plan  Elevated bp:  most likely related to headache and back pain response. Reassured will monitor. Stress mgt discussed Tylenol if recurs Pt to schedule hearing audiological evaluation.   Follow up: 3 mo for recheck bp/recheck Visit date not found  No orders of the defined types were placed in this encounter.  No orders of the defined types were placed in this encounter.     I reviewed the patients updated PMH, FH, and SocHx.    Patient Active Problem List    Diagnosis Date Noted   Paroxysmal SVT (supraventricular tachycardia) 01/11/2022    Priority: High   Polymyalgia rheumatica (HCC) 10/23/2019    Priority: High   Seborrheic dermatitis 11/02/2014    Priority: High   Moderate episode of recurrent major depressive disorder (HCC) 06/22/2013    Priority: High   MRSA cellulitis 01/30/2013    Priority: High   Hypothyroidism 12/18/2010    Priority: High   GAD (generalized anxiety disorder) 10/26/2020    Priority: Medium    MRSA nasal colonization 10/26/2020    Priority: Medium    Spinal stenosis, lumbar region without neurogenic claudication 10/23/2019    Priority: Medium    Lumbar facet arthropathy 10/23/2019    Priority: Medium    Spondylosis of cervical region without myelopathy or radiculopathy 05/29/2018    Priority: Medium    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    Priority: Medium    Dermatitis, atopic 01/30/2013    Priority: Medium    Osteoarthritis, 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    Priority: Medium    Insomnia 03/18/2012    Priority: Medium    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    Priority: Low   Family history of Parkinson disease 05/21/2016    Priority: Low   Menopausal hot flushes 01/30/2013    Priority: Low   Tinnitus of both ears 12/26/2022   Chronic cough 05/02/2022   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 Take in the morning with a full glass of water, on an empty stomach, and do not take anything else by mouth or lie down for the next 30 min.   levothyroxine (SYNTHROID) 50 MCG tablet TAKE 1 TABLET BY MOUTH EVERY DAY   LORazepam (ATIVAN) 0.5 MG tablet Take 1-2 tablets by mouth daily as needed.   sertraline (ZOLOFT) 50 MG tablet Take 1.5 tablets (75 mg total) by mouth daily.   traZODone (DESYREL) 100 MG tablet Take 100 mg by mouth as needed for sleep.    Allergies: Patient has No  Known Allergies. Family History: Patient family history includes Alcohol abuse in her daughter; Asthma in her mother; Depression in her daughter and mother; Heart disease in her father; Hyperlipidemia in her father; Lupus in her mother; Parkinson's disease in her mother; Stroke in her father. Social History:  Patient  reports that she has never smoked. She has never used smokeless tobacco. She reports current alcohol use of about 2.0 standard drinks of alcohol per week. She reports that she does not use drugs.  Review of Systems: Constitutional: Negative for fever malaise or anorexia Cardiovascular: negative for chest pain Respiratory: negative for SOB or persistent cough Gastrointestinal: negative for abdominal pain  Objective  Vitals: BP 138/88   Pulse 69   Temp 98.4 F (36.9 C)   Ht 5' 3.5" (1.613 m)   Wt 119 lb (54 kg)   SpO2 99%   BMI 20.75 kg/m  General: no acute distress , A&Ox3 HEENT: PEERL, conjunctiva normal, neck is supple, Tms clear bilaterally Cardiovascular:  RRR without murmur or gallop.  Respiratory:  Good breath sounds bilaterally, CTAB with normal respiratory effort Skin:  Warm, no rashes  Commons side effects, risks, benefits, and alternatives for medications and treatment plan prescribed today were discussed, and the patient expressed understanding of the given instructions. Patient is instructed to call or message via MyChart if he/she has any questions or concerns regarding our treatment plan. No barriers to understanding were identified. We discussed Red Flag symptoms and signs in detail. Patient expressed understanding regarding what to do in case of urgent or emergency type symptoms.  Medication list was reconciled, printed and provided to the patient in AVS. Patient instructions and summary information was reviewed with the patient as documented in the AVS. This note was prepared with assistance of Dragon voice recognition software. Occasional wrong-word or  sound-a-like substitutions may have occurred due to the inherent limitations of voice recognition software

## 2022-12-26 NOTE — Patient Instructions (Signed)
Please return in 3 months for hypertension recheck   Have your hearing checked.   If you have any questions or concerns, please don't hesitate to send me a message via MyChart or call the office at [phone]. Thank you for visiting with [place] today! It's our pleasure caring for you.   Tinnitus Tinnitus refers to hearing a sound when there is no actual source for that sound. This is often described as ringing in the ears. However, people with this condition may hear a variety of noises, in one ear or in both ears. The sounds of tinnitus can be soft, loud, or somewhere in between. Tinnitus can last for a few seconds or can be constant for days. It may go away without treatment and come back at various times. When tinnitus is constant or happens often, it can lead to other problems, such as trouble sleeping and trouble concentrating. Almost everyone experiences tinnitus at some point. Tinnitus is not the same as hearing loss. Tinnitus that is long-lasting (chronic) or comes back often (recurs) may require medical attention. What are the causes? The cause of tinnitus is often not known. In some cases, it can result from: Exposure to loud noises from machinery, music, or other sources. An object (foreign body) stuck in the ear. Earwax buildup. Drinking alcohol or caffeine. Taking certain medicines. Age-related hearing loss. It may also be caused by medical conditions such as: Ear or sinus infections. Heart diseases or high blood pressure. Allergies. Mnire's disease. Thyroid problems. Tumors. A weak, bulging blood vessel (aneurysm) near the ear. What increases the risk? The following factors may make you more likely to develop this condition: Exposure to loud noises. Age. Tinnitus is more likely in older individuals. Using alcohol or tobacco. What are the signs or symptoms? The main symptom of tinnitus is hearing a sound when there is no source for that sound. It may sound  like: Buzzing. Sizzling. Ringing. Blowing air. Hissing. Whistling. Other sounds may include: Roaring. Running water. A musical note. Tapping. Humming. Symptoms may affect only one ear (unilateral) or both ears (bilateral). How is this diagnosed? Tinnitus is diagnosed based on your symptoms, your medical history, and a physical exam. Your health care provider may do a thorough hearing test (audiologic exam) if your tinnitus: Is unilateral. Causes hearing difficulties. Lasts 6 months or longer. You may work with a health care provider who specializes in hearing disorders (audiologist). You may be asked questions about your symptoms and how they affect your daily life. You may have other tests done, such as: CT scan. MRI. An imaging test of how blood flows through your blood vessels (angiogram). How is this treated? Treating an underlying medical condition can sometimes make tinnitus go away. If your tinnitus continues, other treatments may include: Therapy and counseling to help you manage the stress of living with tinnitus. Sound generators to mask the tinnitus. These include: Tabletop sound machines that play relaxing sounds to help you fall asleep. Wearable devices that fit in your ear and play sounds or music. Acoustic neural stimulation. This involves using headphones to listen to music that contains an auditory signal. Over time, listening to this signal may change some pathways in your brain and make you less sensitive to tinnitus. This treatment is used for very severe cases when no other treatment is working. Using hearing aids or cochlear implants if your tinnitus is related to hearing loss. Hearing aids are worn in the outer ear. Cochlear implants are surgically placed in the inner  ear. Follow these instructions at home: Managing symptoms     When possible, avoid being in loud places and being exposed to loud sounds. Wear hearing protection, such as earplugs, when you  are exposed to loud noises. Use a white noise machine, a humidifier, or other devices to mask the sound of tinnitus. Practice techniques for reducing stress, such as meditation, yoga, or deep breathing. Work with your health care provider if you need help with managing stress. Sleep with your head slightly raised. This may reduce the impact of tinnitus. General instructions Do not use stimulants, such as nicotine, alcohol, or caffeine. Talk with your health care provider about other stimulants to avoid. Stimulants are substances that can make you feel alert and attentive by increasing certain activities in the body (such as heart rate and blood pressure). These substances may make tinnitus worse. Take over-the-counter and prescription medicines only as told by your health care provider. Try to get plenty of sleep each night. Keep all follow-up visits. This is important. Contact a health care provider if: Your tinnitus continues for 3 weeks or longer without stopping. You develop sudden hearing loss. Your symptoms get worse or do not get better with home care. You feel you are not able to manage the stress of living with tinnitus. Get help right away if: You develop tinnitus after a head injury. You have tinnitus along with any of the following: Dizziness. Nausea and vomiting. Loss of balance. Sudden, severe headache. Vision changes. Facial weakness or weakness of arms or legs. These symptoms may represent a serious problem that is an emergency. Do not wait to see if the symptoms will go away. Get medical help right away. Call your local emergency services (911 in the [place] Do not drive yourself to the hospital. Summary Tinnitus refers to hearing a sound when there is no actual source for that sound. This is often described as ringing in the ears. Symptoms may affect only one ear (unilateral) or both ears (bilateral). Use a white noise machine, a humidifier, or other devices to mask the  sound of tinnitus. Do not use stimulants, such as nicotine, alcohol, or caffeine. These substances may make tinnitus worse. This information is not intended to replace advice given to you by your health care provider. Make sure you discuss any questions you have with your health care provider. Document Revised: 07/11/2020 Document Reviewed: 07/11/2020 Elsevier Patient Education  2023 [employer].

## 2023-04-20 ENCOUNTER — Other Ambulatory Visit: Payer: Self-pay | Admitting: Family Medicine

## 2023-06-25 ENCOUNTER — Other Ambulatory Visit: Payer: Self-pay

## 2023-06-25 ENCOUNTER — Telehealth: Payer: Self-pay | Admitting: Family Medicine

## 2023-06-25 DIAGNOSIS — M818 Other osteoporosis without current pathological fracture: Secondary | ICD-10-CM

## 2023-06-25 NOTE — Telephone Encounter (Signed)
Patient has scheduled Bone Density Scan at [facility] located on [address] Tomorrow (06/26/23) at 1 pm. Requests Order for Bone Density Scan be placed/faxed asap to [facility] at location above

## 2023-06-26 LAB — HM DEXA SCAN

## 2023-06-27 ENCOUNTER — Encounter: Payer: Self-pay | Admitting: Family Medicine

## 2023-06-28 ENCOUNTER — Other Ambulatory Visit: Payer: Medicare Other

## 2023-08-20 ENCOUNTER — Other Ambulatory Visit: Payer: Self-pay | Admitting: Family Medicine

## 2023-08-30 ENCOUNTER — Ambulatory Visit: Payer: Self-pay | Admitting: Family Medicine

## 2023-08-30 ENCOUNTER — Encounter: Payer: Self-pay | Admitting: Physician Assistant

## 2023-08-30 ENCOUNTER — Ambulatory Visit: Payer: Medicare Other | Admitting: Physician Assistant

## 2023-08-30 VITALS — BP 124/74 | HR 62 | Temp 97.1°F | Ht 63.5 in | Wt 124.2 lb

## 2023-08-30 DIAGNOSIS — M79652 Pain in left thigh: Secondary | ICD-10-CM

## 2023-08-30 MED ORDER — MELOXICAM 15 MG PO TABS: 15.0000 mg | ORAL_TABLET | Freq: Every day | ORAL | 0 refills | Status: DC

## 2023-08-30 NOTE — Telephone Encounter (Signed)
Please see triage note for patient 

## 2023-08-30 NOTE — Progress Notes (Signed)
 Patient ID: Cheryl Austin, female    DOB: 01/12/1941, 83 y.o.   MRN: [id number]   Assessment & Plan:  Left thigh pain  Other orders -     Meloxicam ; Take 1 tablet (15 mg total) by mouth daily.  Dispense: 30 tablet; Refill: 0    Assessment and Plan    Left Thigh Pain New onset of localized pain in the left thigh, described as pressure-like, worse with weight bearing and sitting. No associated rash, swelling, or weakness. No palpable lumps. Possible muscle strain or nerve irritation. -Prescribe Meloxicam  for 30 days. -Advise heat application a couple of times a day. -If no improvement in 10-14 days, consider imaging. -Pt to reach back out to PCP prn.    No follow-ups on file.    Subjective:    Chief Complaint  Patient presents with   Back Pain    Pt in office c/o left back pain radiating down back of thigh for over 1 wk;     Back Pain   Discussed the use of AI scribe software for clinical note transcription with the patient, who gave verbal consent to proceed.  History of Present Illness   Cheryl Austin, a patient with a history of back problems, presents with a new onset of left thigh pain that started approximately a week ago. The pain, described as pressure-like, is located at the back of the thigh and has been progressively worsening. The pain is particularly noticeable upon waking up and tends to improve slightly with movement, although it never completely resolves. The patient reports that the pain is exacerbated by weight-bearing activities such as standing or sitting. The patient denies any associated rash, swelling, or weakness in the affected leg. Cheryl Austin also denies any recent strenuous physical activity that could have potentially triggered the pain, although Cheryl Austin mentions a specific exercise that involves a stretching motion which Cheryl Austin suspects might have contributed to the onset of her symptoms. The patient has not taken any medications or applied any heat or ice to the  affected area for pain relief.       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/23/2019   By lumbar MRI 06/2019, mild   Thyroid  disease    Hypothyroid    Past Surgical History:  Procedure Laterality Date   APPENDECTOMY  1961   BREAST SURGERY Bilateral 1987 1998   Lumpectomy   EYE SURGERY     TOTAL ABDOMINAL HYSTERECTOMY  1986   BSO/Fibroids    Family History  Problem Relation Age of Onset   Lupus Mother    Asthma Mother    Parkinson's disease Mother    Depression Mother    Heart disease Father    Hyperlipidemia Father    Stroke Father    Alcohol abuse Daughter    Depression Daughter     Social History   Tobacco Use   Smoking status: Never   Smokeless  tobacco: Never  Vaping Use   Vaping status: Never Used  Substance Use Topics   Alcohol use: Yes    Alcohol/week: 2.0 standard drinks of alcohol    Types: 2 Shots of liquor per week   Drug use: No     No Known Allergies  Review of Systems  Musculoskeletal:  Positive for back pain.   NEGATIVE UNLESS OTHERWISE INDICATED IN HPI      Objective:     BP 124/74 (BP Location: Left Arm, Patient Position: Sitting, Cuff Size: Normal)   Pulse 62   Temp (!) 97.1 F (36.2 C) (Temporal)   Ht 5' 3.5 (1.613 m)   Wt 124 lb 3.2 oz (56.3 kg)   SpO2 96%   BMI 21.66 kg/m   Wt Readings from Last 3 Encounters:  08/30/23 124 lb 3.2 oz (56.3 kg)  12/26/22 119 lb (54 kg)  10/04/22 120 lb  3.2 oz (54.5 kg)    BP Readings from Last 3 Encounters:  08/30/23 124/74  12/26/22 138/88  10/04/22 128/70     Physical Exam Vitals and nursing note reviewed.  Constitutional:      Appearance: Normal appearance.  Musculoskeletal:     Right lower leg: No edema.     Left lower leg: No edema.       Legs:  Neurological:     General: No focal deficit present.     Mental Status: Cheryl Austin is alert and oriented to person, place, and time.  Psychiatric:        Mood and Affect: Mood normal.         Cheryl Jenifer M Shauni Henner, PA-C

## 2023-08-30 NOTE — Telephone Encounter (Signed)
 Copied from CRM [id number]. Topic: Clinical - Red Word Triage >> Aug 30, 2023  8:26 AM Gerardine PARAS wrote: Red Word that prompted transfer to Nurse Triage: pain started in leg, worsening; Patient called in regarding a recent pain in the back of left leg that has started over the last week and progressively worsened, stated the pain feels very deep inside leg and is spread out. Also making it difficult to walk and stand, leg is buckling as well as getting much worse at night time.    Chief Complaint: Left leg pain---possibly hamstring pain Symptoms: Pain, felt stiff Frequency: x 1 week Pertinent Negatives: Patient denies swelling, redness, heat on the area, chest pain, back pain, difficulty breathing, rash, fever, numbness, weakness Disposition: [] ED /[] Urgent Care (no appt availability in office) / [x] Appointment(In office/virtual)/ []  [place] [facility]/ [] Home Care/ [] Refused Recommended Disposition /[] [place] [place]/ []  Follow-up with PCP Additional Notes: Patient called and advised that about a week ago she had a pain start in the back of her left thigh that started in the middle of the night.  Patient states that it has gotten worse and she denies any swelling, redness, or heat to the area.  She denies any known trauma and states that the pain gets better with moving it around. She denies any chest pain, back pain, difficulty breathing, rashes, fevers, numbness, weakness, long car rides, major surgeries, prolonged bedrest for any reason, or a history of previous blood clots that she is aware of.  Patient states that the pain is spread out in the whole left hamstring area.  Appointment is made for today 08/30/2023 at 1pm at her PCP Office with Mardy Allwardt, PA.  Patient states she doesn't like to take a lot of medicine so she hasn't taken anything for the pain but she would like to get this checked out soon.  Patient is advised that if anything gets worse or she notices any  swelling/redness/warmth to the area/has severe pain/difficulty breathing/chest pain or any thing else that concerns her, to go immediately to the emergency room.  Patient verbalized understanding.  Reason for Disposition  [1] MODERATE pain (e.g., interferes with normal activities, limping) AND [2] present > 3 days  Answer Assessment - Initial Assessment Questions 1. ONSET: When did the pain start?      A week ago in the middle of the night 2. LOCATION: Where is the pain located?      Behind left thigh--hamstring area 3. PAIN: How bad is the pain?    (Scale 1-10; or mild, moderate, severe)   -  MILD (1-3): doesn't interfere with normal activities    -  MODERATE (4-7): interferes with normal activities (e.g., work or school) or awakens from sleep, limping    -  SEVERE (8-10): excruciating pain, unable to do any normal activities, unable to walk     7 4. WORK OR EXERCISE: Has there been any recent work or exercise that involved this part of the body?      Normal exercise 5. CAUSE: What do you think is causing the leg pain?     unknown 6. OTHER SYMPTOMS: Do you have any other symptoms? (e.g., chest pain, back pain, breathing difficulty, swelling, rash, fever, numbness, weakness)     Denies  Protocols used: Leg Pain-A-AH

## 2023-09-23 ENCOUNTER — Ambulatory Visit: Payer: Medicare Other | Admitting: Family Medicine

## 2023-09-25 ENCOUNTER — Encounter: Payer: Self-pay | Admitting: Family Medicine

## 2023-09-25 ENCOUNTER — Ambulatory Visit: Payer: Medicare Other | Admitting: Family Medicine

## 2023-09-25 VITALS — BP 159/81 | HR 68 | Temp 97.7°F | Ht 63.5 in | Wt 122.8 lb

## 2023-09-25 DIAGNOSIS — M48061 Spinal stenosis, lumbar region without neurogenic claudication: Secondary | ICD-10-CM

## 2023-09-25 DIAGNOSIS — M7062 Trochanteric bursitis, left hip: Secondary | ICD-10-CM

## 2023-09-25 DIAGNOSIS — M47816 Spondylosis without myelopathy or radiculopathy, lumbar region: Secondary | ICD-10-CM | POA: Diagnosis not present

## 2023-09-25 MED ORDER — TRIAMCINOLONE ACETONIDE 40 MG/ML IJ SUSP
40.0000 mg | Freq: Once | INTRAMUSCULAR | Status: AC
Start: 2023-09-25 — End: 2023-09-25
  Administered 2023-09-25: 40 mg via INTRAMUSCULAR

## 2023-09-25 NOTE — Progress Notes (Signed)
 Subjective  CC:  Chief Complaint  Patient presents with   Leg Pain    HPI: Cheryl Austin is a 83 y.o. female who presents to the office today to address the problems listed above in the chief complaint. I reviewed not from 08/30/23  Regarding lab posterior thigh pain.  She has improved significantly but still has some pain.  Points to the lateral thigh and down the left lateral leg.  She has chronic low back pain related to lumbar facet arthropathy and spinal stenosis.  She has a specialist that manages that.  No weakness in the leg.  Some pain with movement.  Hurts to lie on that side.  No weakness in the leg.  No lower extremity swelling or calf pain.  Meloxicam  is helpful but she likes to only take that intermittently.  Assessment  1. Trochanteric bursitis of left hip   2. Lumbar facet arthropathy   3. Spinal stenosis, lumbar region without neurogenic claudication      Plan  Hip bursitis: Status post steroid injection with routine post care instructions given.  Ice, gave handouts on hip bursitis and stretching exercises. Chronic back pain due to spinal stenosis and facet arthropathy: Tylenol, as needed Mobic  and recommended piriformis stretching given pain in the buttocks as well.  No current radicular symptoms present.  Follow up: Scheduled for CPE 10/08/2023  No orders of the defined types were placed in this encounter.  Meds ordered this encounter  Medications   triamcinolone  acetonide (KENALOG -40) injection 40 mg      I reviewed the patients updated PMH, FH, and SocHx.    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    Priority: Medium    Spinal stenosis, lumbar region without neurogenic claudication 10/23/2019    Priority: Medium    Lumbar facet arthropathy 10/23/2019    Priority: Medium    Spondylosis of cervical region without myelopathy or radiculopathy 05/29/2018    Priority: Medium    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    Priority: Medium    Dermatitis, atopic 01/30/2013    Priority: Medium    Osteoarthritis, 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 TAKE IN THE MORNING WITH A FULL GLASS OF WATER, ON AN EMPTY STOMACH, AND DO NOT TAKE ANYTHING ELSE BY MOUTH OR LIE DOWN FOR THE NEXT 30 MIN.   levothyroxine  (SYNTHROID ) 50 MCG tablet TAKE 1 TABLET BY MOUTH EVERY  DAY   LORazepam  (ATIVAN ) 0.5 MG tablet Take 1-2 tablets by mouth daily as needed.   meloxicam  (MOBIC ) 15 MG tablet Take 1 tablet (15 mg total) by mouth daily.   sertraline  (ZOLOFT ) 50 MG tablet Take 1.5 tablets (75 mg total) by mouth daily.   traZODone (DESYREL) 100 MG tablet Take 100 mg by mouth as needed for sleep.    Allergies: Patient has no known allergies. Family History: Patient family history includes Alcohol abuse in her daughter; Asthma in her mother; Depression in her daughter and mother; Heart disease in her father; Hyperlipidemia in her father; Lupus in her mother; Parkinson's disease in her  mother; Stroke in her father. Social History:  Patient  reports that she has never smoked. She has never used smokeless tobacco. She reports current alcohol use of about 2.0 standard drinks of alcohol per week. She reports that she does not use drugs.  Review of Systems: Constitutional: Negative for fever malaise or anorexia Cardiovascular: negative for chest pain Respiratory: negative for SOB or persistent cough Gastrointestinal: negative for abdominal pain  Objective  Vitals: BP (!) 159/81 Comment: machine  Pulse 68   Temp 97.7 F (36.5 C)   Ht 5' 3.5 (1.613 m)   Wt 122 lb 12.8 oz (55.7 kg)   SpO2 96%   BMI 21.41 kg/m  General: no acute distress , A&Ox3 Appears well, moves easily Left greater trochanteric bursa is tender, some lateral thigh tenderness over the fascia present as well.  Normal knee exam.  Normal gait. Left gluteus maximus tenderness present positive sciatic notch tenderness Negative straight leg raise  GR Trochanteric Bursa steroid injection  Procedure Note   Pre-operative Diagnosis: left hip bursitis   Post-operative Diagnosis: same   Indications: pain   Anesthesia: cold spray   Procedure Details    Verbal consent was obtained for the procedure. Universal time out done. The point of maximum tenderness was identified and marked over the hip bursa. The skin prepped with alcohol and cold spray used for anesthesia. A needle was advanced into the bursa and the steroid/lido (20mg  kenalog : 1.76ml lidocaine w/o epi) was administered easily.    Complications:  None; patient tolerated the procedure well.   Commons side effects, risks, benefits, and alternatives for medications and treatment plan prescribed today were discussed, and the patient expressed understanding of the given instructions. Patient is instructed to call or message via MyChart if he/she has any questions or concerns regarding our treatment plan. No barriers to understanding were identified. We  discussed Red Flag symptoms and signs in detail. Patient expressed understanding regarding what to do in case of urgent or emergency type symptoms.  Medication list was reconciled, printed and provided to the patient in AVS. Patient instructions and summary information was reviewed with the patient as documented in the AVS. This note was prepared with assistance of Dragon voice recognition software. Occasional wrong-word or sound-a-like substitutions may have occurred due to the inherent limitations of voice recognition software

## 2023-09-25 NOTE — Patient Instructions (Signed)
 Please follow up as scheduled for your next visit with me: 10/08/2023   If you have any questions or concerns, please don't hesitate to send me a message via MyChart or call the office at [phone]. Thank you for visiting with us  today! It's our pleasure caring for you.

## 2023-10-02 ENCOUNTER — Ambulatory Visit: Payer: Self-pay | Admitting: Family Medicine

## 2023-10-02 ENCOUNTER — Other Ambulatory Visit: Payer: Self-pay | Admitting: Physician Assistant

## 2023-10-02 NOTE — Telephone Encounter (Signed)
Chief Complaint: Dizziness Symptoms: Ringing in ears, feeling of "floating," weakness, lightheadedness Frequency: Intermittent Pertinent Negatives: Patient denies vertigo, fever, chest pain, diarrhea, vomiting, bleeding Disposition: [] ED /[] Urgent Care (no appt availability in office) / [x] Appointment(In office/virtual)/ []  [facility]/ [] Home Care/ [] Refused Recommended Disposition /[] [facility]/ []  Follow-up with PCP Additional Notes: Pt states she is experiencing dizziness, ringing in her ears, and a feeling like she is floating. Pt states this has been going on for a 3-4 months. Pt states the dizziness is usually mild. An appt was made for pt for Friday, 2/14. This RN educated pt on home care, new-worsening symptoms, when to call back/seek emergent care. Pt verbalized understanding and agrees to plan.   Copied from CRM [id number]. Topic: Clinical - Red Word Triage >> Oct 02, 2023 12:29 PM Theodis Sato wrote: Red Word that prompted transfer to Nurse Triage: Dizziness and floating feeling with ringing in ears. Reason for Disposition  [1] MILD dizziness (e.g., walking normally) AND [2] has NOT been evaluated by doctor (or NP/PA) for this  (Exception: Dizziness caused by heat exposure, sudden standing, or poor fluid intake.)  Answer Assessment - Initial Assessment Questions 1. DESCRIPTION: "Describe your dizziness."     Comes on late morning usually, yesterday it didn't happen until late afternoon, day before it was all day 2. LIGHTHEADED: "Do you feel lightheaded?" (e.g., somewhat faint, woozy, weak upon standing)     Yes,all of those 3. VERTIGO: "Do you feel like either you or the room is spinning or tilting?" (i.e. vertigo)     Denies 4. SEVERITY: "How bad is it?"  "Do you feel like you are going to faint?" "Can you stand and walk?"   - MILD: Feels slightly dizzy, but walking normally.   - MODERATE: Feels unsteady when walking, but not falling; interferes with normal  activities (e.g., school, work).   - SEVERE: Unable to walk without falling, or requires assistance to walk without falling; feels like passing out now.      Mild to moderate sometimes 5. ONSET:  "When did the dizziness begin?"     3-4 months ago 6. AGGRAVATING FACTORS: "Does anything make it worse?" (e.g., standing, change in head position)     Denies 7. HEART RATE: "Can you tell me your heart rate?" "How many beats in 15 seconds?"  (Note: not all patients can do this)       Denies 8. CAUSE: "What do you think is causing the dizziness?"     Not sure 9. RECURRENT SYMPTOM: "Have you had dizziness before?" If Yes, ask: "When was the last time?" "What happened that time?"     I think but not as severe as now 10. OTHER SYMPTOMS: "Do you have any other symptoms?" (e.g., fever, chest pain, vomiting, diarrhea, bleeding)       Denies  Protocols used: Dizziness - Lightheadedness-A-AH

## 2023-10-02 NOTE — Telephone Encounter (Signed)
Noted

## 2023-10-02 NOTE — Telephone Encounter (Signed)
Patient saw Allwardt for acute visit and requesting refill; please advise

## 2023-10-04 ENCOUNTER — Encounter: Payer: Self-pay | Admitting: Family Medicine

## 2023-10-04 ENCOUNTER — Ambulatory Visit: Payer: Medicare Other | Admitting: Family Medicine

## 2023-10-04 VITALS — BP 162/78 | HR 71 | Temp 98.6°F | Ht 63.5 in | Wt 121.6 lb

## 2023-10-04 DIAGNOSIS — M47816 Spondylosis without myelopathy or radiculopathy, lumbar region: Secondary | ICD-10-CM

## 2023-10-04 DIAGNOSIS — H9113 Presbycusis, bilateral: Secondary | ICD-10-CM | POA: Diagnosis not present

## 2023-10-04 DIAGNOSIS — R42 Dizziness and giddiness: Secondary | ICD-10-CM | POA: Diagnosis not present

## 2023-10-04 DIAGNOSIS — M7062 Trochanteric bursitis, left hip: Secondary | ICD-10-CM

## 2023-10-04 DIAGNOSIS — H9313 Tinnitus, bilateral: Secondary | ICD-10-CM

## 2023-10-04 DIAGNOSIS — I10 Essential (primary) hypertension: Secondary | ICD-10-CM | POA: Diagnosis not present

## 2023-10-04 MED ORDER — VALSARTAN 80 MG PO TABS: 80.0000 mg | ORAL_TABLET | Freq: Every day | ORAL | 3 refills | Status: DC

## 2023-10-04 NOTE — Progress Notes (Signed)
Subjective  CC:  Chief Complaint  Patient presents with   Dizziness    Pt stated that she has been feeling dizzy for the past 3/4 months   Tinnitus    HPI: Cheryl Austin is a 83 y.o. female who presents to the office today to address the problems listed above in the chief complaint. 83 year old presents with recurrent and intermittent tinnitus.  She also explains a feeling of disequilibrium.  Her lightheadedness or feeling "off" started many months ago it was worse over the last week for unclear reasons.  She also describes some hearing loss and feeling like her head is in a drum at times.  She denies vertigo, neurologic changes, headaches, imbalance or falls.  She has no vision changes.  She has been stressed and feels that this could be contributing to some of her symptoms.  No chest pain or palpitations.  She has a history of PSVT but denies recent symptoms from that. Elevated blood pressures: Her last 2 office visits show elevated blood pressure, patient confirms this at home as well.  No history of hypertension.  No chest pain or shortness of breath.  No lower extremity edema, no dyspnea on exertion. Hip bursitis status post your injection last week: Fortunately this has improved significantly. Low back pain persist: She is ready to have this reevaluated.  She has had a history of epidural steroid injection I believe in the past with Dr. Murray Hodgkins who is no longer practicing.  No radicular symptoms  Assessment  1. Essential hypertension   2. Dysequilibrium   3. Tinnitus of both ears   4. Presbycusis of both ears   5. Trochanteric bursitis of left hip   6. Lumbar facet arthropathy      Plan  Hypertension, new onset: Start valsartan 80 mg daily.  Education given.  She follows a low-salt diet.  She will start walking again.  Monitor effects on tinnitus and disequilibrium.  I believe these 2 are related.  She denies orthostatic hypotensive changes. Tinnitus: Likely presbycusis  related.  She will follow-up with audiology and get a hearing aid that was suggested last year. Hip bursitis is improved Will review chart and make new referral for chronic low back pain reevaluation.  Follow up: As scheduled for complete physical and follow-up of blood pressure 11/06/2023  No orders of the defined types were placed in this encounter.  Meds ordered this encounter  Medications   valsartan (DIOVAN) 80 MG tablet    Sig: Take 1 tablet (80 mg total) by mouth daily.    Dispense:  90 tablet    Refill:  3      I reviewed the patients updated PMH, FH, and SocHx.    Patient Active Problem List   Diagnosis Date Noted   Essential hypertension 10/04/2023    Priority: High   Paroxysmal SVT (supraventricular tachycardia) (HCC) 01/11/2022    Priority: High   Polymyalgia rheumatica (HCC) 10/23/2019    Priority: High   Seborrheic dermatitis 11/02/2014    Priority: High   Moderate episode of recurrent major depressive disorder (HCC) 06/22/2013    Priority: High   MRSA cellulitis 01/30/2013    Priority: High   Hypothyroidism 12/18/2010    Priority: High   Tinnitus of both ears 12/26/2022    Priority: Medium    Chronic cough 05/02/2022    Priority: Medium    GAD (generalized anxiety disorder) 10/26/2020    Priority: Medium    MRSA nasal colonization 10/26/2020  Priority: Medium    Spinal stenosis, lumbar region without neurogenic claudication 10/23/2019    Priority: Medium    Lumbar facet arthropathy 10/23/2019    Priority: Medium    Spondylosis of cervical region without myelopathy or radiculopathy 05/29/2018    Priority: Medium    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 TAKE IN THE MORNING WITH A FULL GLASS OF WATER, ON AN EMPTY STOMACH, AND DO NOT TAKE ANYTHING ELSE BY MOUTH OR LIE DOWN FOR THE NEXT 30 MIN.   levothyroxine (SYNTHROID) 50 MCG tablet TAKE 1 TABLET BY MOUTH EVERY DAY   LORazepam (ATIVAN) 0.5 MG tablet Take 1-2 tablets by mouth daily as needed.   meloxicam (MOBIC) 15 MG tablet Take 1 tablet (15 mg total) by mouth daily.   sertraline (ZOLOFT) 50 MG tablet Take 1.5 tablets (75 mg total) by mouth daily.   traZODone (DESYREL) 100 MG tablet Take 100 mg by mouth as needed for sleep.   valsartan (DIOVAN) 80 MG tablet Take 1 tablet (80 mg total) by mouth daily.    Allergies: Patient has no known allergies. Family History: Patient family history includes Alcohol abuse in her daughter; Asthma in her mother; Depression in her daughter and mother; Heart disease in her father; Hyperlipidemia in her father; Lupus in her mother; Parkinson's disease in her mother; Stroke in her father. Social History:  Patient  reports that she has never smoked. She has never used smokeless tobacco. She reports current alcohol use of about 2.0 standard drinks of alcohol per week. She reports that she does not use drugs.  Review of Systems: Constitutional: Negative for fever malaise or anorexia Cardiovascular: negative for chest pain Respiratory: negative for SOB or persistent cough Gastrointestinal: negative for abdominal pain  Objective  Vitals: BP (!) 162/78 (BP Location: Right Arm, Patient Position: Sitting, Cuff Size: Normal)   Pulse 71   Temp 98.6 F (37 C)   Ht 5' 3.5" (1.613 m)   Wt 121 lb 9.6 oz (55.2 kg)   SpO2 99%   BMI 21.20 kg/m  General: no acute distress , A&Ox3, appears well, moves easily to exam table HEENT: PEERL,  conjunctiva normal, neck is supple Cardiovascular:  RRR without murmur or gallop.  Respiratory:  Good breath sounds bilaterally, CTAB with normal respiratory effort Skin:  Warm, no rashes Neuro: Normal cranial nerves, can walk in tandem without problems.  No nystagmus.  Hallpike is negative  Commons side effects, risks, benefits, and alternatives for medications and treatment plan prescribed today were discussed, and the patient expressed understanding of the given instructions. Patient is instructed to call or message via MyChart if he/she has any questions or concerns regarding our treatment plan. No barriers to understanding were identified. We discussed Red Flag symptoms and signs in detail. Patient expressed understanding regarding what to do in case of urgent or emergency type symptoms.  Medication list was  reconciled, printed and provided to the patient in AVS. Patient instructions and summary information was reviewed with the patient as documented in the AVS. This note was prepared with assistance of Dragon voice recognition software. Occasional wrong-word or sound-a-like substitutions may have occurred due to the inherent limitations of voice recognition software

## 2023-10-08 ENCOUNTER — Encounter: Payer: Medicare Other | Admitting: Family Medicine

## 2023-10-15 ENCOUNTER — Encounter: Payer: Self-pay | Admitting: Physician Assistant

## 2023-10-15 ENCOUNTER — Ambulatory Visit: Payer: Medicare Other | Admitting: Physician Assistant

## 2023-10-15 VITALS — BP 110/66 | HR 76 | Temp 98.1°F | Ht 63.5 in | Wt 121.8 lb

## 2023-10-15 DIAGNOSIS — J029 Acute pharyngitis, unspecified: Secondary | ICD-10-CM | POA: Diagnosis not present

## 2023-10-15 LAB — POCT RAPID STREP A (OFFICE): Rapid Strep A Screen: POSITIVE — AB

## 2023-10-15 MED ORDER — AMOXICILLIN 500 MG PO CAPS
500.0000 mg | ORAL_CAPSULE | Freq: Two times a day (BID) | ORAL | 0 refills | Status: AC
Start: 2023-10-15 — End: 2023-10-25

## 2023-10-15 NOTE — Progress Notes (Signed)
 Cheryl Austin is a 83 y.o. female here for a new problem.  History of Present Illness:   Chief Complaint  Patient presents with   Sore Throat    Patient states sore throat, fatigue, little cough. Just came back from [place] from trip everyone was sick up there.   Sore Throat  Patient complains of a sore throat that has persisted for the past 3 days.  Associated symptoms include mild cough and fatigue. Symptoms are currently not improving since onset, but she states the sore throat is worse in the morning.  Reports taking OTC cough drops to relief symptoms  Denies any ear pain or urinary symptoms at this time.  Patient states that she recently returned from a trip up [place] where she was exposed to a viral sickness.     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 Continue cal and vit d.    DEXA -1.2, 06/2013; stable from prior. Continue cal and vit d.     Osteoporosis    Polymyalgia rheumatica (HCC) 10/23/2019   Aug 2020, Dr. Corliss Skains   Restless legs syndrome (RLS) 08/02/2017   Spinal stenosis, lumbar region without neurogenic claudication 10/23/2019   By lumbar MRI 06/2019, mild   Thyroid disease    Hypothyroid     Social History   Tobacco Use   Smoking status: Never   Smokeless tobacco: Never  Vaping Use   Vaping status: Never Used  Substance Use Topics   Alcohol use: Yes    Alcohol/week: 2.0 standard drinks of alcohol    Types: 2 Shots of liquor per week   Drug use: No    Past Surgical History:  Procedure Laterality Date   APPENDECTOMY  1961   BREAST  SURGERY Bilateral 1987 1998   Lumpectomy   EYE SURGERY     TOTAL ABDOMINAL HYSTERECTOMY  1986   BSO/Fibroids    Family History  Problem Relation Age of Onset   Lupus Mother    Asthma Mother    Parkinson's disease Mother    Depression Mother    Heart disease Father    Hyperlipidemia Father    Stroke Father    Alcohol abuse Daughter    Depression Daughter     No Known Allergies  Current Medications:   Current Outpatient Medications:    ibandronate (BONIVA) 150 MG tablet, TAKE 1 TABLET (150 MG TOTAL) BY MOUTH EVERY 30 (THIRTY) DAYS. TAKE IN THE MORNING WITH A FULL GLASS OF WATER, ON AN EMPTY STOMACH, AND DO NOT TAKE ANYTHING ELSE BY MOUTH OR LIE DOWN FOR THE NEXT 30 MIN., Disp: 3 tablet, Rfl: 3   levothyroxine (SYNTHROID) 50 MCG tablet, TAKE 1 TABLET BY MOUTH EVERY DAY, Disp: 90 tablet, Rfl: 3   LORazepam (ATIVAN) 0.5 MG tablet, Take 1-2 tablets by mouth daily as needed., Disp: 30 tablet, Rfl: 2   meloxicam (MOBIC) 15 MG tablet, TAKE 1 TABLET (15 MG TOTAL) BY MOUTH DAILY., Disp: 30 tablet, Rfl: 2   sertraline (ZOLOFT) 50 MG tablet, Take 1.5 tablets (75 mg  total) by mouth daily., Disp: 90 tablet, Rfl: 3   traZODone (DESYREL) 100 MG tablet, Take 100 mg by mouth as needed for sleep., Disp: , Rfl:    valsartan (DIOVAN) 80 MG tablet, Take 1 tablet (80 mg total) by mouth daily., Disp: 90 tablet, Rfl: 3   Review of Systems:   Review of Systems  Constitutional:  Positive for malaise/fatigue.  HENT:  Positive for sore throat. Negative for ear pain.   Respiratory:  Positive for cough.   Negative unless otherwise specified per HPI.  Vitals:   Vitals:   10/15/23 1126  BP: 110/66  Pulse: 76  Temp: 98.1 F (36.7 C)  TempSrc: Temporal  SpO2: 96%  Weight: 121 lb 12.8 oz (55.2 kg)  Height: 5' 3.5" (1.613 m)     Body mass index is 21.24 kg/m.  Physical Exam:   Physical Exam Vitals and nursing note reviewed.  Constitutional:      General: She is not in acute distress.     Appearance: She is well-developed. She is not ill-appearing or toxic-appearing.  HENT:     Mouth/Throat:     Pharynx: Posterior oropharyngeal erythema present.  Cardiovascular:     Rate and Rhythm: Normal rate and regular rhythm.     Pulses: Normal pulses.     Heart sounds: Normal heart sounds, S1 normal and S2 normal.  Pulmonary:     Effort: Pulmonary effort is normal.     Breath sounds: Normal breath sounds.  Skin:    General: Skin is warm and dry.  Neurological:     Mental Status: She is alert.     GCS: GCS eye subscore is 4. GCS verbal subscore is 5. GCS motor subscore is 6.  Psychiatric:        Speech: Speech normal.        Behavior: Behavior normal. Behavior is cooperative.    Results for orders placed or performed in visit on 10/15/23  POCT rapid strep A  Result Value Ref Range   Rapid Strep A Screen Positive (A) Negative     Assessment and Plan:   Sore throat No red flags on exam.   Will initiate amoxicillin per orders.  Discussed taking medications as prescribed.  Reviewed return precautions including new or worsening fever, SOB, new or worsening cough or other concerns.  Push fluids and rest.  I recommend that patient follow-up if symptoms worsen or persist despite treatment x 7-10 days, sooner if needed.     Jarold Motto, PA-C  I,Safa M Kadhim,acting as a scribe for Jarold Motto, PA.,have documented all relevant documentation on the behalf of Jarold Motto, PA,as directed by  Jarold Motto, PA while in the presence of Jarold Motto, [place].   I, Jarold Motto, [place], have reviewed all documentation for this visit. The documentation on 10/15/23 for the exam, diagnosis, procedures, and orders are all accurate and complete.

## 2023-10-28 ENCOUNTER — Encounter: Payer: Self-pay | Admitting: Family Medicine

## 2023-10-29 ENCOUNTER — Telehealth: Payer: Self-pay

## 2023-10-29 DIAGNOSIS — I10 Essential (primary) hypertension: Secondary | ICD-10-CM

## 2023-10-29 DIAGNOSIS — M353 Polymyalgia rheumatica: Secondary | ICD-10-CM

## 2023-10-29 DIAGNOSIS — F411 Generalized anxiety disorder: Secondary | ICD-10-CM

## 2023-10-29 DIAGNOSIS — E039 Hypothyroidism, unspecified: Secondary | ICD-10-CM

## 2023-10-29 DIAGNOSIS — G4709 Other insomnia: Secondary | ICD-10-CM

## 2023-10-29 DIAGNOSIS — I471 Supraventricular tachycardia, unspecified: Secondary | ICD-10-CM

## 2023-10-29 NOTE — Telephone Encounter (Signed)
 Copied from CRM [id number]. Topic: Clinical - Request for Lab/Test Order >> Oct 29, 2023  2:26 PM Sim Boast F wrote: Reason for CRM: Patient called to follow up on MyChart message that was sent yesterday 10/28/23, wants blood work and urinalysis (osmolalipy) ,  orders put in and lab appointment scheduled before her physical on 11/06/23  Please Advise

## 2023-10-29 NOTE — Addendum Note (Signed)
 Addended by: Asencion Partridge on: 10/29/2023 06:39 PM   Modules accepted: Orders

## 2023-10-30 NOTE — Telephone Encounter (Signed)
 Replied to her telephone call. See there. Orders placed

## 2023-10-31 ENCOUNTER — Other Ambulatory Visit (INDEPENDENT_AMBULATORY_CARE_PROVIDER_SITE_OTHER)

## 2023-10-31 DIAGNOSIS — M353 Polymyalgia rheumatica: Secondary | ICD-10-CM | POA: Diagnosis not present

## 2023-10-31 DIAGNOSIS — G4709 Other insomnia: Secondary | ICD-10-CM | POA: Diagnosis not present

## 2023-10-31 DIAGNOSIS — F411 Generalized anxiety disorder: Secondary | ICD-10-CM | POA: Diagnosis not present

## 2023-10-31 DIAGNOSIS — I10 Essential (primary) hypertension: Secondary | ICD-10-CM | POA: Diagnosis not present

## 2023-10-31 DIAGNOSIS — E039 Hypothyroidism, unspecified: Secondary | ICD-10-CM

## 2023-10-31 LAB — COMPREHENSIVE METABOLIC PANEL
ALT: 18 U/L (ref 0–35)
AST: 23 U/L (ref 0–37)
Albumin: 4.4 g/dL (ref 3.5–5.2)
Alkaline Phosphatase: 67 U/L (ref 39–117)
BUN: 14 mg/dL (ref 6–23)
CO2: 25 meq/L (ref 19–32)
Calcium: 9.1 mg/dL (ref 8.4–10.5)
Chloride: 102 meq/L (ref 96–112)
Creatinine, Ser: 0.73 mg/dL (ref 0.40–1.20)
GFR: 77.24 mL/min (ref >60.00)
Glucose, Bld: 98 mg/dL (ref 70–99)
Potassium: 4.1 meq/L (ref 3.5–5.1)
Sodium: 134 meq/L — ABNORMAL LOW (ref 135–145)
Total Bilirubin: 0.5 mg/dL (ref 0.2–1.2)
Total Protein: 6.4 g/dL (ref 6.0–8.3)

## 2023-10-31 LAB — LIPID PANEL
Cholesterol: 192 mg/dL (ref 0–200)
HDL: 83.6 mg/dL (ref >39.00)
LDL Cholesterol: 97 mg/dL (ref 0–99)
NonHDL: 108.21
Total CHOL/HDL Ratio: 2
Triglycerides: 56 mg/dL (ref 0.0–149.0)
VLDL: 11.2 mg/dL (ref 0.0–40.0)

## 2023-10-31 LAB — CBC WITH DIFFERENTIAL/PLATELET
Basophils Absolute: 0 10*3/uL (ref 0.0–0.1)
Basophils Relative: 0.4 % (ref 0.0–3.0)
Eosinophils Absolute: 0.1 10*3/uL (ref 0.0–0.7)
Eosinophils Relative: 2.9 % (ref 0.0–5.0)
HCT: 40.5 % (ref 36.0–46.0)
Hemoglobin: 13.5 g/dL (ref 12.0–15.0)
Lymphocytes Relative: 37.2 % (ref 12.0–46.0)
Lymphs Abs: 1.5 10*3/uL (ref 0.7–4.0)
MCHC: 33.4 g/dL (ref 30.0–36.0)
MCV: 89.5 fl (ref 78.0–100.0)
Monocytes Absolute: 0.3 10*3/uL (ref 0.1–1.0)
Monocytes Relative: 8.6 % (ref 3.0–12.0)
Neutro Abs: 2 10*3/uL (ref 1.4–7.7)
Neutrophils Relative %: 50.9 % (ref 43.0–77.0)
Platelets: 209 10*3/uL (ref 150.0–400.0)
RBC: 4.53 Mil/uL (ref 3.87–5.11)
RDW: 13.3 % (ref 11.5–15.5)
WBC: 4 10*3/uL (ref 4.0–10.5)

## 2023-10-31 LAB — URINALYSIS, ROUTINE W REFLEX MICROSCOPIC
Bilirubin Urine: NEGATIVE
Hgb urine dipstick: NEGATIVE
Ketones, ur: NEGATIVE
Leukocytes,Ua: NEGATIVE
Nitrite: NEGATIVE
RBC / HPF: NONE SEEN (ref 0–?)
Specific Gravity, Urine: <=1.005 — AB (ref 1.000–1.030)
Total Protein, Urine: NEGATIVE
Urine Glucose: NEGATIVE
Urobilinogen, UA: 0.2 (ref 0.0–1.0)
pH: 6.5 (ref 5.0–8.0)

## 2023-10-31 LAB — SEDIMENTATION RATE: Sed Rate: 4 mm/h (ref 0–30)

## 2023-10-31 LAB — TSH: TSH: 0.66 u[IU]/mL (ref 0.35–5.50)

## 2023-11-06 ENCOUNTER — Ambulatory Visit (INDEPENDENT_AMBULATORY_CARE_PROVIDER_SITE_OTHER): Payer: Medicare Other | Admitting: Family Medicine

## 2023-11-06 ENCOUNTER — Encounter: Payer: Self-pay | Admitting: Family Medicine

## 2023-11-06 VITALS — BP 148/79 | HR 66 | Temp 97.7°F | Ht 63.5 in | Wt 122.6 lb

## 2023-11-06 DIAGNOSIS — M353 Polymyalgia rheumatica: Secondary | ICD-10-CM | POA: Diagnosis not present

## 2023-11-06 DIAGNOSIS — Z0001 Encounter for general adult medical examination with abnormal findings: Secondary | ICD-10-CM

## 2023-11-06 DIAGNOSIS — E559 Vitamin D deficiency, unspecified: Secondary | ICD-10-CM

## 2023-11-06 DIAGNOSIS — J0100 Acute maxillary sinusitis, unspecified: Secondary | ICD-10-CM | POA: Diagnosis not present

## 2023-11-06 DIAGNOSIS — E538 Deficiency of other specified B group vitamins: Secondary | ICD-10-CM

## 2023-11-06 DIAGNOSIS — Z1231 Encounter for screening mammogram for malignant neoplasm of breast: Secondary | ICD-10-CM

## 2023-11-06 DIAGNOSIS — I471 Supraventricular tachycardia, unspecified: Secondary | ICD-10-CM

## 2023-11-06 DIAGNOSIS — F331 Major depressive disorder, recurrent, moderate: Secondary | ICD-10-CM | POA: Diagnosis not present

## 2023-11-06 DIAGNOSIS — E039 Hypothyroidism, unspecified: Secondary | ICD-10-CM

## 2023-11-06 DIAGNOSIS — M818 Other osteoporosis without current pathological fracture: Secondary | ICD-10-CM

## 2023-11-06 DIAGNOSIS — I10 Essential (primary) hypertension: Secondary | ICD-10-CM | POA: Diagnosis not present

## 2023-11-06 DIAGNOSIS — M48061 Spinal stenosis, lumbar region without neurogenic claudication: Secondary | ICD-10-CM

## 2023-11-06 LAB — VITAMIN D 25 HYDROXY (VIT D DEFICIENCY, FRACTURES): VITD: 34.56 ng/mL (ref 30.00–100.00)

## 2023-11-06 LAB — VITAMIN B12: Vitamin B-12: 511 pg/mL (ref 211–911)

## 2023-11-06 MED ORDER — AMOXICILLIN-POT CLAVULANATE 875-125 MG PO TABS: 1.0000 | ORAL_TABLET | Freq: Two times a day (BID) | ORAL | 0 refills | Status: AC

## 2023-11-06 MED ORDER — VALSARTAN 160 MG PO TABS
160.0000 mg | ORAL_TABLET | Freq: Every day | ORAL | 3 refills | Status: AC
Start: 2023-11-06 — End: ?

## 2023-11-06 NOTE — Progress Notes (Signed)
 Subjective  Chief Complaint  Patient presents with   Annual Exam    Pt here for Annual Exam and is not currently fasting    Hypertension    HPI: Cheryl Austin is a 83 y.o. female who presents to [facility] [place] today for a Female Wellness Visit. She also has the concerns and/or needs as listed above in the chief complaint. These will be addressed in addition to the Health Maintenance Visit.   Wellness Visit: annual visit with health maintenance review and exam  HM: pt requesting mammogram. Hasn't had screens in years but now would like to make sure screen is ok. Not exercising. See below. Declines vaccines.   Chronic disease f/u and/or acute problem visit: (deemed necessary to be done in addition to the wellness visit): Discussed the use of AI scribe software for clinical note transcription with the patient, who gave verbal consent to proceed.  History of Present Illness   Cheryl Austin "Cheryl Austin" is an 83 year old female who presents with fatigue, tinnitus, and disequilibrium.  She experiences a persistent buzzing sound in her ears, described as a low constant noise that sometimes increases in intensity. This is accompanied by a sensation of plugged ears and occasional crusty blood in her ear after cleaning with a Q-tip, though she is careful not to insert it deeply. No allergy symptoms, congestion, or drainage.  She reports episodes of fatigue and a feeling of weakness, particularly in the afternoons. These symptoms have been ongoing for about a year but have recently become more persistent. She describes her activity level as constantly moving and not sitting for long periods. However, she notes that when she does sit, such as during a recent episode on a Sunday, her blood pressure improves and she feels better. Her home blood pressure readings typically range from 135 to 145 mmHg systolic. No side effects from her current medication, and she does not believe it is  contributing to her symptoms.  She experiences a sensation of unsteadiness and occasional headaches located at the front of her head, described as a 'banging' sensation. She does not take medication for the headaches as they are not severe enough to warrant it.  She mentions soreness in her back and a history of severe end-stage back disease. She has previously undergone pain management procedures, including nerve ablation, but found them ineffective. She currently sees a chiropractor once a week.  She recently had a dental filling replaced and has experienced soreness in the area since, although she does not have dental pain.  Her current medications include Boniva once a month for bone health and sertraline for mood, which she continues to receive from her psychiatrist. She also uses a vitamin-rich shake supplement. She reports eating well but sometimes feels she eats too much. She describes her sleep as sometimes problematic but not abnormal. No significant changes in mood or depression, although she feels overwhelmed by ongoing life events.      Assessment  1. Encounter for well adult exam with abnormal findings   2. Essential hypertension   3. Moderate episode of recurrent major depressive disorder (HCC)   4. Polymyalgia rheumatica (HCC)   5. Spinal stenosis, lumbar region without neurogenic claudication   6. Steroid-induced osteoporosis   7. Acquired hypothyroidism   8. Paroxysmal SVT (supraventricular tachycardia) (HCC)   9. Vitamin D deficiency   10. Vitamin B12 deficiency   11. Screening mammogram for breast cancer   12. Subacute maxillary sinusitis  Plan  Female Wellness Visit: Age appropriate Health Maintenance and Prevention measures were discussed with patient. Included topics are cancer screening recommendations, ways to keep healthy (see AVS) including dietary and exercise recommendations, regular eye and dental care, use of seat belts, and avoidance of moderate alcohol  use and tobacco use.  BMI: discussed patient's BMI and encouraged positive lifestyle modifications to help get to or maintain a target BMI. HM needs and immunizations were addressed and ordered. See below for orders. See HM and immunization section for updates. Routine labs and screening tests ordered including cmp, cbc and lipids where appropriate. Discussed recommendations regarding Vit D and calcium supplementation (see AVS)  Chronic disease management visit and/or acute problem visit: Assessment and Plan    Tinnitus Worsening tinnitus with constant buzzing and episodes of fatigue and disequilibrium. Hearing loss checked. Possible inner ear issues affecting balance. - Consider further evaluation if symptoms persist.  Subacute sinus infection Facial soreness, clogged ears, and occasional epistaxis suggest subacute sinus infection contributing to fatigue and disequilibrium. - Prescribe antibiotics. - Recommend Mucinex for drainage.  Fatigue Persistent afternoon fatigue with normal blood work. Possible vitamin deficiencies, mood disorders, or sinus infection contributing. - Check vitamin B12 and vitamin D levels. - Encourage exercise and physical activity. - Consider mood evaluation if symptoms persist.  Hypertension Blood pressure 135/80 to 145/80 mmHg. Asymptomatic hypertension unlikely to cause fatigue. On medication without side effects. - increase valsartan to 160mg  daily - Monitor blood pressure regularly.  Chronic back pain Severe end-stage back disease with previous interventions. Focus on pain management strategies. - Continue chiropractic care. - Consider pain management options, including anti-inflammatories and pain medications. - Explore non-pharmacological interventions such as massage and meditation.  Osteoporosis is stable on boniva.   PMR remains in remission  Depression: active but stable on zoloft managed by Dr. Donell Beers.  Recommend exercise.   General  Health Maintenance Overdue for a mammogram. Encouraged exercise for overall health improvement. - Order mammogram. - Encourage exercise and consider alternative gym options.     Follow up: 6 mo for recheck and HTN  Orders Placed This Encounter  Procedures   MM DIGITAL SCREENING BILATERAL   VITAMIN D 25 Hydroxy (Vit-D Deficiency, Fractures)   Vitamin B12   Meds ordered this encounter  Medications   amoxicillin-clavulanate (AUGMENTIN) 875-125 MG tablet    Sig: Take 1 tablet by mouth 2 (two) times daily for 10 days.    Dispense:  20 tablet    Refill:  0   valsartan (DIOVAN) 160 MG tablet    Sig: Take 1 tablet (160 mg total) by mouth daily.    Dispense:  90 tablet    Refill:  3    Increasing dose up from 80mg . Thank you.      Body mass index is 21.38 kg/m. Wt Readings from Last 3 Encounters:  11/06/23 122 lb 9.6 oz (55.6 kg)  10/15/23 121 lb 12.8 oz (55.2 kg)  10/04/23 121 lb 9.6 oz (55.2 kg)     Patient Active Problem List   Diagnosis Date Noted Date Diagnosed   Essential hypertension 10/04/2023     Priority: High    New dx 09/2023. Started low dose valsartan.    Paroxysmal SVT (supraventricular tachycardia) (HCC) 01/11/2022     Priority: High    zio monitor 2023, nl echo; prn bb    Polymyalgia rheumatica (HCC) 10/23/2019     Priority: High    Aug 2020, Dr. 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 On boniva. DEXA 10/2020 T = -2.6, had been on fosamax short term. Worsening. rec restarting. deveshwar eval as well. On fosamax DEXA 07/2018 T = - 2.3 lowest; mild worsening. Started fosamax due to chronic pred 2021. DEXA 07/2105: T =  -1.2, 06/2013; stable from prior. Continue cal and vit d.        Dermatitis, atopic 01/30/2013     Priority: Medium     Dr. Koleen Nimrod, severe, failed Cellcept, on Muran Now seeing [facility], 05/2013- started on methotrexate  Dr. Koleen Nimrod, severe, failed Cellcept, on Muran Now seeing [facility], 05/2013- started on methotrexate   Dr. Koleen Nimrod, severe, failed Cellcept, on Muran Now seeing [facility], 05/2013- started on methotrexate  Dr. 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    Health Maintenance  Topic Date Due   INFLUENZA VACCINE  11/18/2023 (Originally 03/21/2023)   Zoster Vaccines- Shingrix (1 of 2) 11/28/2023 (Originally 08/16/1960)   Medicare Annual Wellness (AWV)  08/20/2024 (Originally 10/13/2022)   DTaP/Tdap/Td (7 - Td or Tdap) 08/29/2024 (Originally  06/20/2023)   DEXA SCAN  06/25/2025   Pneumonia Vaccine 20+ Years old  Completed   HPV VACCINES  Aged Out   MAMMOGRAM  Discontinued   COVID-19 Vaccine  Discontinued   Hepatitis C Screening  Discontinued   Immunization History  Administered Date(s) Administered   DT (Pediatric) 05/21/2008   Fluad Quad(high Dose 65+) 04/27/2020, 06/08/2021, 05/02/2022   Influenza Split 06/20/2010, 05/14/2011, 06/18/2012, 06/22/2013, 07/02/2014, 07/05/2015, 05/21/2016, 05/29/2018   Influenza, High Dose Seasonal PF 06/18/2012, 06/18/2012, 06/22/2013, 06/22/2013, 07/02/2014, 07/02/2014, 07/05/2015, 07/05/2015, 05/21/2016, 05/21/2016, 08/02/2017, 04/27/2020   Influenza, Seasonal, Injecte, Preservative Fre 05/14/2011   Influenza,inj,Quad PF,6+ Mos 05/21/2016, 05/29/2018   Influenza,trivalent, recombinat, inj, PF 05/14/2011  Influenza-Unspecified 05/14/2011, 06/18/2012, 06/22/2013, 07/02/2014, 07/05/2015   PFIZER Comirnaty(Gray Top)Covid-19 Tri-Sucrose Vaccine 10/11/2019, 11/04/2019   PFIZER(Purple Top)SARS-COV-2 Vaccination 10/11/2019, 11/04/2019   Pneumococcal Conjugate-13 07/04/2014, 07/04/2014   Pneumococcal Polysaccharide-23 03/20/2005, 06/22/2011   Pneumococcal-Unspecified 03/20/2005, 06/22/2011   Td 05/21/2008   Td (Adult),5 Lf Tetanus Toxid, Preservative Free 05/21/2008   Tdap 05/21/2008, 06/22/2011, 06/19/2013   We updated and reviewed the patient's past history in detail and it is documented below. Allergies: Patient has no known allergies. Past Medical History Patient  has a past medical history of Anxiety, Chronic cough (05/02/2022), Cutaneous lupus erythematosus, Family history of Parkinson disease (05/21/2016), GERD (gastroesophageal reflux disease), Kidney failure, Moderate episode of recurrent major depressive disorder (HCC), Osteopenia (07/13/2013), Osteoporosis, Polymyalgia rheumatica (HCC) (10/23/2019), Restless legs syndrome (RLS) (08/02/2017), Spinal stenosis, lumbar region without  neurogenic claudication (10/23/2019), and Thyroid disease. Past Surgical History Patient  has a past surgical history that includes Total abdominal hysterectomy (1986); Appendectomy (1961); Eye surgery; and Breast surgery (Bilateral, 1987 1998). Family History: Patient family history includes Alcohol abuse in her daughter; Asthma in her mother; Depression in her daughter and mother; Heart disease in her father; Hyperlipidemia in her father; Lupus in her mother; Parkinson's disease in her mother; Stroke in her father. Social History:  Patient  reports that she has never smoked. She has never used smokeless tobacco. She reports current alcohol use of about 2.0 standard drinks of alcohol per week. She reports that she does not use drugs.  Review of Systems: Constitutional: negative for fever or malaise Ophthalmic: negative for photophobia, double vision or loss of vision Cardiovascular: negative for chest pain, dyspnea on exertion, or new LE swelling Respiratory: negative for SOB or persistent cough Gastrointestinal: negative for abdominal pain, change in bowel habits or melena Genitourinary: negative for dysuria or gross hematuria, no abnormal uterine bleeding or disharge Musculoskeletal: negative for new gait disturbance or muscular weakness Integumentary: negative for new or persistent rashes, no breast lumps Neurological: negative for TIA or stroke symptoms Psychiatric: negative for SI or delusions Allergic/Immunologic: negative for hives  Patient Care Team    Relationship Specialty Notifications Start End  [name], MD PCP - General Family Medicine  06/11/16   Sedalia Muta, PT Physical Therapist Physical Therapy  03/31/19     Objective  Vitals: BP (!) 148/79   Pulse 66   Temp 97.7 F (36.5 C)   Ht 5' 3.5" (1.613 m)   Wt 122 lb 9.6 oz (55.6 kg)   SpO2 97%   BMI 21.38 kg/m  General:  Well developed, well nourished, no acute distress  Psych:  Alert and orientedx3,normal  mood and affect HEENT:  Normocephalic, atraumatic, non-icteric sclera,  supple neck without adenopathy, mass or thyromegaly, + left > right max sinus ttp Cardiovascular:  Normal S1, S2, RRR without gallop, rub or murmur Respiratory:  Good breath sounds bilaterally, CTAB with normal respiratory effort Gastrointestinal: normal bowel sounds, soft, non-tender, no noted masses. No HSM MSK: extremities without edema, joints without erythema or swelling Neurologic:    Mental status is normal.  Gross motor and sensory exams are normal.  No tremor  No visits with results within 1 Day(s) from this visit.  Latest known visit with results is:  Lab on 10/31/2023  Component Date Value Ref Range Status   Color, Urine 10/31/2023 YELLOW  Yellow;Lt. Yellow;Straw;Dark Yellow;Amber;Green;Red;Brown Final   APPearance 10/31/2023 CLEAR  Clear;Turbid;Slightly Cloudy;Cloudy Final   Specific Gravity, Urine 10/31/2023 <=1.005 (A)  1.000 - 1.030 Final   pH 10/31/2023  6.5  5.0 - 8.0 Final   Total Protein, Urine 10/31/2023 NEGATIVE  Negative Final   Urine Glucose 10/31/2023 NEGATIVE  Negative Final   Ketones, ur 10/31/2023 NEGATIVE  Negative Final   Bilirubin Urine 10/31/2023 NEGATIVE  Negative Final   Hgb urine dipstick 10/31/2023 NEGATIVE  Negative Final   Urobilinogen, UA 10/31/2023 0.2  0.0 - 1.0 Final   Leukocytes,Ua 10/31/2023 NEGATIVE  Negative Final   Nitrite 10/31/2023 NEGATIVE  Negative Final   WBC, UA 10/31/2023 0-2/hpf  0-2/hpf Final   RBC / HPF 10/31/2023 none seen  0-2/hpf Final   Squamous Epithelial / HPF 10/31/2023 Rare(0-4/hpf)  Rare(0-4/hpf) Final   TSH 10/31/2023 0.66  0.35 - 5.50 uIU/mL Final   Cholesterol 10/31/2023 192  0 - 200 mg/dL Final   Triglycerides 16/05/9603 56.0  0.0 - 149.0 mg/dL Final   HDL 54/04/8118 83.60  >39.00 mg/dL Final   VLDL 14/78/2956 11.2  0.0 - 40.0 mg/dL Final   LDL Cholesterol 10/31/2023 97  0 - 99 mg/dL Final   Total CHOL/HDL Ratio 10/31/2023 2   Final   NonHDL  10/31/2023 108.21   Final   Sed Rate 10/31/2023 4  0 - 30 mm/hr Final   Sodium 10/31/2023 134 (L)  135 - 145 mEq/L Final   Potassium 10/31/2023 4.1  3.5 - 5.1 mEq/L Final   Chloride 10/31/2023 102  96 - 112 mEq/L Final   CO2 10/31/2023 25  19 - 32 mEq/L Final   Glucose, Bld 10/31/2023 98  70 - 99 mg/dL Final   BUN 21/30/8657 14  6 - 23 mg/dL Final   Creatinine, Ser 10/31/2023 0.73  0.40 - 1.20 mg/dL Final   Total Bilirubin 10/31/2023 0.5  0.2 - 1.2 mg/dL Final   Alkaline Phosphatase 10/31/2023 67  39 - 117 U/L Final   AST 10/31/2023 23  0 - 37 U/L Final   ALT 10/31/2023 18  0 - 35 U/L Final   Total Protein 10/31/2023 6.4  6.0 - 8.3 g/dL Final   Albumin 84/69/6295 4.4  3.5 - 5.2 g/dL Final   GFR 28/41/3244 77.24  >60.00 mL/min Final   Calcium 10/31/2023 9.1  8.4 - 10.5 mg/dL Final   WBC 08/22/7251 4.0  4.0 - 10.5 K/uL Final   RBC 10/31/2023 4.53  3.87 - 5.11 Mil/uL Final   Hemoglobin 10/31/2023 13.5  12.0 - 15.0 g/dL Final   HCT 66/44/0347 40.5  36.0 - 46.0 % Final   MCV 10/31/2023 89.5  78.0 - 100.0 fl Final   MCHC 10/31/2023 33.4  30.0 - 36.0 g/dL Final   RDW 42/59/5638 13.3  11.5 - 15.5 % Final   Platelets 10/31/2023 209.0  150.0 - 400.0 K/uL Final   Neutrophils Relative % 10/31/2023 50.9  43.0 - 77.0 % Final   Lymphocytes Relative 10/31/2023 37.2  12.0 - 46.0 % Final   Monocytes Relative 10/31/2023 8.6  3.0 - 12.0 % Final   Eosinophils Relative 10/31/2023 2.9  0.0 - 5.0 % Final   Basophils Relative 10/31/2023 0.4  0.0 - 3.0 % Final   Neutro Abs 10/31/2023 2.0  1.4 - 7.7 K/uL Final   Lymphs Abs 10/31/2023 1.5  0.7 - 4.0 K/uL Final   Monocytes Absolute 10/31/2023 0.3  0.1 - 1.0 K/uL Final   Eosinophils Absolute 10/31/2023 0.1  0.0 - 0.7 K/uL Final   Basophils Absolute 10/31/2023 0.0  0.0 - 0.1 K/uL Final    Commons side effects, risks, benefits, and alternatives for medications and treatment plan  prescribed today were discussed, and the patient expressed understanding of the  given instructions. Patient is instructed to call or message via MyChart if he/she has any questions or concerns regarding our treatment plan. No barriers to understanding were identified. We discussed Red Flag symptoms and signs in detail. Patient expressed understanding regarding what to do in case of urgent or emergency type symptoms.  Medication list was reconciled, printed and provided to the patient in AVS. Patient instructions and summary information was reviewed with the patient as documented in the AVS. This note was prepared with assistance of Dragon voice recognition software. Occasional wrong-word or sound-a-like substitutions may have occurred due to the inherent limitations of voice recognition software

## 2023-11-06 NOTE — Patient Instructions (Signed)
 Please return in 6 months for hypertension follow up.   I will release your lab results to you on your MyChart account with further instructions. You may see the results before I do, but when I review them I will send you a message with my report or have my assistant call you if things need to be discussed. Please reply to my message with any questions. Thank you!   If you have any questions or concerns, please don't hesitate to send me a message via MyChart or call the office at [phone]. Thank you for visiting with [place] today! It's our pleasure caring for you.   VISIT SUMMARY:  Today, we discussed your symptoms of fatigue, tinnitus, and disequilibrium. We reviewed your current medications and lifestyle, and we have developed a plan to address your concerns and improve your overall health.  YOUR PLAN:  -TINNITUS: Tinnitus is a condition where you hear a constant buzzing or ringing sound in your ears. We will monitor your symptoms and consider further evaluation if they persist.  -SUBACUTE SINUS INFECTION: A subacute sinus infection can cause facial soreness, clogged ears, and occasional nosebleeds. We will treat this with antibiotics and recommend Mucinex to help with drainage.  -FATIGUE: Fatigue is a feeling of extreme tiredness. We will check your vitamin B12 and vitamin D levels, encourage regular exercise, and consider how much your mood is playing a role. Could also be worseningd due to the sinus infection.  -HYPERTENSION: Hypertension is high blood pressure. I have increased your dose of valsartan to 160mg  daily.   -CHRONIC BACK PAIN: Chronic back pain is long-lasting pain in your back. We will continue your chiropractic care and explore other pain management options, including anti-inflammatory medications, pain medications, massage, and meditation.  -GENERAL HEALTH MAINTENANCE: You are overdue for a mammogram. Regular exercise is encouraged to improve your overall health, and we can  consider alternative gym options.  INSTRUCTIONS:  Please follow up with the recommended blood tests for vitamin B12 and vitamin D levels. Continue monitoring your blood pressure at home and keep a record of your readings. Schedule a mammogram as soon as possible. If your symptoms of tinnitus or fatigue persist, please contact our office for further evaluation.

## 2023-11-07 ENCOUNTER — Ambulatory Visit: Payer: Self-pay

## 2023-11-07 NOTE — Telephone Encounter (Signed)
 Spoke with [name] and confirmed Diovan 160mg  tab increase. Patient also had questions about what pharmacy her Augmentin went  to. Confirmed pharmacy as [facility] on [address] and pharmacy receipt confirmation received yesterday morning. Patient stated she will call pharmacy back and see if Augmentin is available. Advised patient to call back with any further questions. Patient verbalized understanding.  Copied from CRM [id number]. Topic: Clinical - Medication Question >> Nov 07, 2023 11:26 AM Gurney Maxin H wrote: Reason for CRM: Patients medication valsartan (DIOVAN) 80 MG tablet increased to valsartan (DIOVAN) 160 MG tablet and patient just wants to clarify that she can still take the old ones and just double up since she has so many left until they are gone.  Kelby Lotspeich [phone] Reason for Disposition  [1] Prescription prescribed recently is not at pharmacy AND [2] triager has access to patient's EMR AND [3] prescription is recorded in the EMR  Answer Assessment - Initial Assessment Questions 1. NAME of MEDICINE: "What medicine(s) are you calling about?"    Spoke with [name] and confirmed Diovan 160mg  tab increase. Patient also had questions about what pharmacy her Augmentin went  to. Confirmed pharmacy as [facility] [address] and pharmacy receipt confirmation received yesterday morning. Patient stated she will call pharmacy back and see if Augmentin is available.  Protocols used: Medication Question Call-A-AH

## 2023-11-08 LAB — HM MAMMOGRAPHY

## 2023-11-12 ENCOUNTER — Encounter: Payer: Self-pay | Admitting: Family Medicine

## 2023-11-13 ENCOUNTER — Encounter: Payer: Self-pay | Admitting: Family Medicine

## 2023-11-13 NOTE — Progress Notes (Signed)
 See mychart note Dear Ms. Lusignan, I hope you are feeling better after taking the antibiotics.  Your vitamin levels are normal. Sincerely, Dr. Mardelle Matte

## 2023-12-05 ENCOUNTER — Emergency Department (HOSPITAL_BASED_OUTPATIENT_CLINIC_OR_DEPARTMENT_OTHER)
Admission: EM | Admit: 2023-12-05 | Discharge: 2023-12-05 | Disposition: A | Discharge status: Home / Self Care | Attending: Emergency Medicine | Admitting: Emergency Medicine

## 2023-12-05 ENCOUNTER — Other Ambulatory Visit: Payer: Self-pay

## 2023-12-05 DIAGNOSIS — S61211A Laceration without foreign body of left index finger without damage to nail, initial encounter: Secondary | ICD-10-CM | POA: Diagnosis not present

## 2023-12-05 DIAGNOSIS — Z23 Encounter for immunization: Secondary | ICD-10-CM | POA: Insufficient documentation

## 2023-12-05 DIAGNOSIS — E039 Hypothyroidism, unspecified: Secondary | ICD-10-CM | POA: Diagnosis not present

## 2023-12-05 DIAGNOSIS — W272XXA Contact with scissors, initial encounter: Secondary | ICD-10-CM | POA: Insufficient documentation

## 2023-12-05 DIAGNOSIS — S6992XA Unspecified injury of left wrist, hand and finger(s), initial encounter: Secondary | ICD-10-CM | POA: Diagnosis present

## 2023-12-05 MED ORDER — TETANUS-DIPHTH-ACELL PERTUSSIS 5-2.5-18.5 LF-MCG/0.5 IM SUSY
0.5000 mL | PREFILLED_SYRINGE | Freq: Once | INTRAMUSCULAR | Status: AC
  Administered 2023-12-05: 0.5 mL via INTRAMUSCULAR
  Filled 2023-12-05: qty 0.5

## 2023-12-05 NOTE — ED Provider Notes (Signed)
 [facility]ERGENC[facility] Emergency Department Provider Note MRN:  [id number]  Arrival date & time: 12/05/23     Chief Complaint   Laceration   History of Present Illness   Cheryl Austin is a 83 y.o. year-old female with no pertinent past medical history presenting to the ED with chief complaint of laceration.  Cut her finger with scissors last night about 8:30 PM, tried to put a dressing on it herself but it persistently bled throughout the night, here for assistance.  Denies any other injuries, does not take blood thinners, unsure of her last tetanus shot.  Review of Systems  A thorough review of systems was obtained and all systems are negative except as noted in the HPI and PMH.   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 Continue cal and vit d.    DEXA -1.2, 06/2013; stable from prior. Continue cal and vit d.     Osteoporosis    Polymyalgia rheumatica (HCC) 10/23/2019   Aug 2020, Dr. 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   By lumbar MRI 06/2019, mild   Thyroid disease    Hypothyroid    Past Surgical History:  Procedure Laterality Date   APPENDECTOMY  1961   BREAST SURGERY Bilateral 1987 1998   Lumpectomy   EYE SURGERY     TOTAL ABDOMINAL HYSTERECTOMY  1986   BSO/Fibroids    Family History  Problem Relation Age of Onset   Lupus Mother    Asthma Mother    Parkinson's disease Mother    Depression Mother    Heart disease Father     Hyperlipidemia Father    Stroke Father    Alcohol abuse Daughter    Depression Daughter     Social History   Socioeconomic History   Marital status: Widowed    Spouse name: Not on file   Number of children: 1   Years of education: 79   Highest education level: Not on file  Occupational History   Occupation: Retired  Tobacco Use   Smoking status: Never   Smokeless tobacco: Never  Vaping Use   Vaping status: Never Used  Substance and Sexual Activity   Alcohol use: Yes    Alcohol/week: 2.0 standard drinks of alcohol    Types: 2 Shots of liquor per week   Drug use: No   Sexual activity: Never    Partners: Male    Birth control/protection: Post-menopausal  Other Topics Concern   Not on file  Social History Narrative   Drinks 2-3 caffeine drinks a day    Social Drivers of Corporate investment banker Strain: Low Risk  (10/13/2021)   Overall Financial Resource Strain (CARDIA)    Difficulty of Paying Living Expenses: Not hard at all  Food Insecurity: No Food Insecurity (10/13/2021)   Hunger Vital Sign    Worried About Running Out of Food in the Last Year: Never true    Ran  Out of Food in the Last Year: Never true  Transportation Needs: No Transportation Needs (10/13/2021)   PRAPARE - Administrator, Civil Service (Medical): No    Lack of Transportation (Non-Medical): No  Physical Activity: Sufficiently Active (10/13/2021)   Exercise Vital Sign    Days of Exercise per Week: 3 days    Minutes of Exercise per Session: 90 min  Stress: Stress Concern Present (10/13/2021)   [employer] of Occupational Health - Occupational Stress Questionnaire    Feeling of Stress : To some extent  Social Connections: Socially Isolated (10/13/2021)   Social Connection and Isolation Panel [NHANES]    Frequency of Communication with Friends and Family: More than three times a week    Frequency of Social Gatherings with Friends and Family: More than three times a week    Attends  Religious Services: Never    Database administrator or Organizations: No    Attends Banker Meetings: Never    Marital Status: Widowed  Intimate Partner Violence: Not At Risk (10/13/2021)   Humiliation, Afraid, Rape, and Kick questionnaire    Fear of Current or Ex-Partner: No    Emotionally Abused: No    Physically Abused: No    Sexually Abused: No     Physical Exam   Vitals:   12/05/23 0636  BP: (!) 172/85  Pulse: 66  Resp: 16  Temp: 97.6 F (36.4 C)  SpO2: 100%    CONSTITUTIONAL: Well-appearing, NAD NEURO/PSYCH:  Alert and oriented x 3, no focal deficits EYES:  eyes equal and reactive ENT/NECK:  no LAD, no JVD CARDIO: Regular rate, well-perfused, normal S1 and S2 PULM:  CTAB no wheezing or rhonchi GI/GU:  non-distended, non-tender MSK/SPINE:  No gross deformities, no edema SKIN:  no rash, atraumatic   *Additional and/or pertinent findings included in MDM below  Diagnostic and Interventional Summary    EKG Interpretation Date/Time:    Ventricular Rate:    PR Interval:    QRS Duration:    QT Interval:    QTC Calculation:   R Axis:      Text Interpretation:         Labs Reviewed - No data to display  No orders to display    Medications  Tdap (BOOSTRIX) injection 0.5 mL (0.5 mLs Intramuscular Given 12/05/23 0659)     Procedures  /  Critical Care .Laceration Repair  Date/Time: 12/05/2023 7:06 AM  Performed by: Sabas Sous, MD Authorized by: Sabas Sous, MD   Consent:    Consent obtained:  Verbal   Consent given by:  Patient   Risks, benefits, and alternatives were discussed: yes     Risks discussed:  Infection, need for additional repair, nerve damage, poor wound healing, poor cosmetic result, pain, retained foreign body, tendon damage and vascular damage   Alternatives discussed:  No treatment Universal protocol:    Procedure explained and questions answered to patient or proxy's satisfaction: yes     Immediately prior to  procedure, a time out was called: yes     Patient identity confirmed:  Verbally with patient Anesthesia:    Anesthesia method:  Local infiltration   Local anesthetic:  Lidocaine 1% w/o epi Laceration details:    Location:  Finger   Finger location:  L index finger   Length (cm):  1   Depth (mm):  2 Pre-procedure details:    Preparation:  Patient was prepped and draped in usual sterile fashion Exploration:  Hemostasis achieved with:  Direct pressure   Wound exploration: wound explored through full range of motion and entire depth of wound visualized     Contaminated: no   Treatment:    Area cleansed with:  Saline   Amount of cleaning:  Standard Skin repair:    Repair method:  Steri-Strips and tissue adhesive   Number of Steri-Strips:  4 Approximation:    Approximation:  Loose Repair type:    Repair type:  Simple Post-procedure details:    Dressing:  Open (no dressing)   Procedure completion:  Tolerated well, no immediate complications   ED Course and Medical Decision Making  Initial Impression and Ddx Superficial oval-shaped laceration to the left index finger, dorsal surface at the PIP.  Still oozing with blood, small amount of missing tissue.  We discussed management options, proceeding with Steri-Strips, Dermabond.  Past medical/surgical history that increases complexity of ED encounter: None  Interpretation of Diagnostics Laboratory and/or imaging options to aid in the diagnosis/care of the patient were considered.  After careful history and physical examination, it was determined that there was no indication for diagnostics at this time.  Patient Reassessment and Ultimate Disposition/Management     See procedural details above, appropriate for discharge.  Patient management required discussion with the following services or consulting groups:  None  Complexity of Problems Addressed Acute complicated illness or Injury  Additional Data Reviewed and  Analyzed Further history obtained from: None  Additional Factors Impacting ED Encounter Risk Minor Procedures  Merrick Abe. Harless Lien, MD [facility] Emergency Medicine [facility] [email or web address]  Final Clinical Impressions(s) / ED Diagnoses     ICD-10-CM   1. Laceration of left index finger without foreign body without damage to nail, initial encounter  Z61.096E       ED Discharge Orders     None        Discharge Instructions Discussed with and Provided to Patient:    Discharge Instructions      You were evaluated in the Emergency Department and after careful evaluation, we did not find any emergent condition requiring admission or further testing in the hospital.  Your exam/testing today was overall reassuring.  We repaired your laceration with a combination of Steri-Strips and medical adhesive.  Recommend keeping the finger clean and dry for the next few days.  After 3 to 5 days you can remove the splint and the Steri-Strips and medical adhesive will slowly wear away with time.  Please return to the Emergency Department if you experience any worsening of your condition.  Thank you for allowing us  to be a part of your care.       Edson Graces, MD 12/05/23 [phone]

## 2023-12-05 NOTE — Discharge Instructions (Signed)
 You were evaluated in the Emergency Department and after careful evaluation, we did not find any emergent condition requiring admission or further testing in the hospital.  Your exam/testing today was overall reassuring.  We repaired your laceration with a combination of Steri-Strips and medical adhesive.  Recommend keeping the finger clean and dry for the next few days.  After 3 to 5 days you can remove the splint and the Steri-Strips and medical adhesive will slowly wear away with time.  Please return to the Emergency Department if you experience any worsening of your condition.  Thank you for allowing us  to be a part of your care.

## 2023-12-05 NOTE — ED Notes (Signed)
 Splint and bandage applied to prevent finger flexation

## 2023-12-05 NOTE — ED Triage Notes (Signed)
 Pt cut her left 2nd finger with scissors last evening at approx 2000. Pt reports it has continued to bleed even with pressure dressings

## 2023-12-11 ENCOUNTER — Ambulatory Visit: Payer: Self-pay

## 2023-12-11 NOTE — Telephone Encounter (Signed)
  Chief Complaint: wound recheck, bandage removal Symptoms: left index finger laceration Frequency: occurred 1 week ago Pertinent Negatives: Patient denies red streaks, fever, pain. Disposition: [] ED /[] Urgent Care (no appt availability in office) / [x] Appointment(In office/virtual)/ []  [facility]/ [] Home Care/ [] Refused Recommended Disposition /[] [facility]/ []  Follow-up with PCP Additional Notes: Patient states she has skin glue and steri strips on her knuckle/left index finger. She states she needs the steri strips taken off, she states she has been too worried and queasy to remove them herself. She was seen in the ED on 12/05/23 for the laceration to her finger. No available appointments with PCP, patient agreeable to visit with NP Hud[place]  Copied from CRM [id number]. Topic: Appointments - Scheduling Inquiry for Clinic >> Dec 11, 2023 10:29 AM Kita Perish H wrote: Reason for CRM: Patient was seen in the ER for a cut on her finger and needs to have sutures removed and finger checked, Dr. Jonelle Neri next available is 4/28 and patient needs to get in sooner, please reach out, thanks.  Ruben [phone] Reason for Disposition  Minor cut or scratch  Answer Assessment - Initial Assessment Questions 1. APPEARANCE of INJURY: "What does the injury look like?"      She states there is skin glue and steri strips. She states it is very red and unsure if that is from the wound or if it is dried blood.  2. SIZE: "How large is the cut?"      1 cm long, 2cm deep per ED visit note.  3. BLEEDING: "Is it bleeding now?" If Yes, ask: "Is it difficult to stop?"      No.  4. LOCATION: "Where is the injury located?"      Left index knuckle.  5. ONSET: "How long ago did the injury occur?"      12/04/23.  6. MECHANISM: "Tell me how it happened."      She states she cut skin off her left index knuckle.  7. TETANUS: "When was the last tetanus booster?"     12/05/23.  8. PREGNANCY: "Is there  any chance you are pregnant?" "When was your last menstrual period?"     N/A.  Protocols used: Cuts and Lacerations-A-AH

## 2023-12-11 NOTE — Telephone Encounter (Signed)
 Noted.

## 2023-12-12 ENCOUNTER — Ambulatory Visit: Admitting: Family

## 2023-12-12 ENCOUNTER — Encounter: Payer: Self-pay | Admitting: Family

## 2023-12-12 VITALS — BP 129/77 | HR 78 | Temp 97.3°F | Ht 63.5 in | Wt 122.4 lb

## 2023-12-12 DIAGNOSIS — S61211A Laceration without foreign body of left index finger without damage to nail, initial encounter: Secondary | ICD-10-CM

## 2023-12-12 NOTE — Progress Notes (Signed)
 Patient ID: Cheryl Austin, female    DOB: 1940/11/17, 83 y.o.   MRN: [id number]  Chief Complaint  Patient presents with   Laceration    Pt c/o laceration on left pointer finger since 4/16. Pt was seen in ED on 4/17. Pt would like steri strips removed.   Discussed the use of AI scribe software for clinical note transcription with the patient, who gave verbal consent to proceed.  History of Present Illness The patient presents with a cut on her knuckle that was persistently bleeding. The injury occurred when she accidentally cut the top of her knuckle with scissors. Despite attempts to stop the bleeding at home, it continued overnight, prompting her to seek medical attention. The bleeding was eventually controlled at the hospital, where the wound was glued and Steri-Strips were applied. The patient denies being on any blood thinners. She reports that the Steri-Strips were embedded and difficult to remove, but denies any pain associated with their removal. The patient also notes some drainage from the wound, which she believes may be due to the glue used to close the wound. She denies any pain associated with the wound.  Assessment & Plan Open wound of left forefinger - Open wound on dorsal knuckle treated with tissue adhesive and Steri-Strips. Healing well, painless, with expected drainage from adhesive. - Bandage removed which pulled steri-strips with it, cleaned wound with betadine, mild maceration noted periwound, glue intact, no bleeding or drainage noted, new bandaid reapplied. - Advised to clean daily with soap and water, dry well, reapply dry bandage for about 2-3 more days. - Keep wrapped during the day until no moisture noted on bandage. - Advised on s/s of infection, when to call the office.    Subjective:    Outpatient Medications Prior to Visit  Medication Sig Dispense Refill   ibandronate  (BONIVA ) 150 MG tablet TAKE 1 TABLET (150 MG TOTAL) BY MOUTH EVERY 30 (THIRTY) DAYS. TAKE  IN THE MORNING WITH A FULL GLASS OF WATER, ON AN EMPTY STOMACH, AND DO NOT TAKE ANYTHING ELSE BY MOUTH OR LIE DOWN FOR THE NEXT 30 MIN. 3 tablet 3   levothyroxine  (SYNTHROID ) 50 MCG tablet TAKE 1 TABLET BY MOUTH EVERY DAY 90 tablet 3   LORazepam  (ATIVAN ) 0.5 MG tablet Take 1-2 tablets by mouth daily as needed. 30 tablet 2   meloxicam  (MOBIC ) 15 MG tablet TAKE 1 TABLET (15 MG TOTAL) BY MOUTH DAILY. 30 tablet 2   sertraline  (ZOLOFT ) 50 MG tablet Take 1.5 tablets (75 mg total) by mouth daily. 90 tablet 3   traZODone (DESYREL) 100 MG tablet Take 100 mg by mouth as needed for sleep.     valsartan  (DIOVAN ) 160 MG tablet Take 1 tablet (160 mg total) by mouth daily. 90 tablet 3   No facility-administered medications prior to visit.   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 Continue cal and vit d.    DEXA -1.2, 06/2013; stable from prior. Continue cal and vit d.     Osteoporosis    Polymyalgia  rheumatica (HCC) 10/23/2019   Aug 2020, Dr. 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  Constitutional:      Appearance: Normal appearance.  Cardiovascular:      Rate and Rhythm: Normal rate and regular rhythm.  Pulmonary:     Effort: Pulmonary effort is normal.     Breath sounds: Normal breath sounds.  Musculoskeletal:        General: Normal range of motion.  Skin:    General: Skin is warm and dry.     Findings: Laceration (left dorsal forefinger, distal metatarsal joint, clean, glue intact, mild maceration periwound noted, approx. 1.5cm in length) present.  Neurological:     Mental Status: She is alert.  Psychiatric:        Mood and Affect: Mood normal.        Behavior: Behavior normal.    BP 129/77 (BP Location: Left Arm, Patient Position: Sitting, Cuff Size: Normal)   Pulse 78   Temp (!) 97.3 F (36.3 C) (Temporal)   Ht 5' 3.5" (1.613 m)   Wt 122 lb 6 oz (55.5 kg)   SpO2 99%   BMI 21.34 kg/m  Wt Readings from Last 3 Encounters:  12/12/23 122 lb 6 oz (55.5 kg)  11/06/23 122 lb 9.6 oz (55.6 kg)  10/15/23 121 lb 12.8 oz (55.2 kg)      Versa Gore, NP

## 2023-12-16 ENCOUNTER — Encounter: Payer: Self-pay | Admitting: Family Medicine

## 2023-12-16 ENCOUNTER — Ambulatory Visit: Admitting: Family Medicine

## 2023-12-16 VITALS — BP 145/78 | HR 78 | Temp 98.1°F | Ht 63.5 in | Wt 123.2 lb

## 2023-12-16 DIAGNOSIS — I10 Essential (primary) hypertension: Secondary | ICD-10-CM

## 2023-12-16 DIAGNOSIS — R42 Dizziness and giddiness: Secondary | ICD-10-CM

## 2023-12-16 DIAGNOSIS — R5383 Other fatigue: Secondary | ICD-10-CM

## 2023-12-16 NOTE — Progress Notes (Signed)
 Subjective  CC:  Chief Complaint  Patient presents with   Dizziness    Pt stated that this has been going on for about a yr. Head feels like it is full in the front area    HPI: Cheryl Austin is a 83 y.o. female who presents to the office today to address the problems listed above in the chief complaint. Discussed the use of AI scribe software for clinical note transcription with the patient, who gave verbal consent to proceed.  History of Present Illness Cheryl Austin "Cheryl Austin" is an 83 year old female who presents with persistent ear symptoms and dizziness. See last note and labs.   She experiences persistent ear symptoms, including a sensation of her head feeling like a drum and a light buzzing sound. These symptoms have been ongoing for a long time. She recently obtained hearing aids, which have not been helpful. A fitting specialist mentioned that her eardrum needs cleaning due to wax buildup.  She experiences daily headaches in the frontal region, which are not severe enough to require medication and tend to resolve spontaneously. She feels tired and dizzy, attributing these symptoms to her ear issues.  She was previously prescribed antibiotics but discontinued them after three days due to severe diarrhea. She has not had a fair trial of antibiotics due to this side effect.  Her blood pressure is slightly elevated, particularly during physical activity. She monitors it regularly, noting normal readings when seated and resting, but it rises to about 145 when active. She takes her blood pressure medication in the evening, which has helped with previous dizziness associated with the medication.  No tenderness when pressure is applied to her head. No significant pain, rhinorrhea, or fever associated with her symptoms.   Assessment  1. Dysequilibrium   2. Other fatigue   3. Essential hypertension      Plan  Assessment and Plan Assessment & Plan Hearing issues with buzzing  sensation/dysequilibrium/fatiuge Hearing aids ineffective. Possible earwax buildup noted. Differential includes ear-related issues. ENT evaluation prioritized. - Flush earwax to improve hearing aid function. - Refer to ENT specialist Dr. Darlin Ehrlich - Refer to neurologist if ENT evaluation is inconclusive.  R/o Chronic sinusitis Suspected due to head fullness and buzzing. No acute sinus symptoms. Differential includes ear-related issues or chronic sinusitis without drainage. CT scan planned for assessment. - Order CT scan of the sinuses.  Hypertension Slightly elevated in office. Rises to 145 mmHg with activity, normalizes at rest. Current medication better tolerated in the evening. Goal to maintain normal BP during activity. - new medication; will give more time to see how she does; if bp remains elevated, will adjust dose. Want to avoid hypotension: age, thin, variable diet.    Orders Placed This Encounter  Procedures   CT SINUS WO CONTRAST   Ambulatory referral to Neurology   No orders of the defined types were placed in this encounter.    I reviewed the patients updated PMH, FH, and SocHx.    Patient Active Problem List   Diagnosis Date Noted   Essential hypertension 10/04/2023    Priority: High   Paroxysmal SVT (supraventricular tachycardia) (HCC) 01/11/2022    Priority: High   Polymyalgia rheumatica (HCC) 10/23/2019    Priority: High   Seborrheic dermatitis 11/02/2014    Priority: High   Moderate episode of recurrent major depressive disorder (HCC) 06/22/2013    Priority: High   MRSA cellulitis 01/30/2013    Priority: High   Hypothyroidism 12/18/2010  Priority: High   Tinnitus of both ears 12/26/2022    Priority: Medium    Chronic cough 05/02/2022    Priority: Medium    GAD (generalized anxiety disorder) 10/26/2020    Priority: Medium    MRSA nasal colonization 10/26/2020    Priority: Medium    Spinal stenosis, lumbar region without neurogenic claudication  10/23/2019    Priority: Medium    Lumbar facet arthropathy 10/23/2019    Priority: Medium    Spondylosis of cervical region without myelopathy or radiculopathy 05/29/2018    Priority: Medium    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 TAKE IN THE MORNING WITH A FULL GLASS OF WATER, ON AN EMPTY STOMACH, AND DO NOT TAKE ANYTHING ELSE BY MOUTH OR LIE DOWN FOR THE NEXT 30 MIN.   levothyroxine  (SYNTHROID ) 50 MCG tablet TAKE 1 TABLET BY MOUTH EVERY DAY   LORazepam  (ATIVAN ) 0.5 MG tablet Take 1-2 tablets by mouth daily as needed.   meloxicam  (MOBIC ) 15 MG tablet TAKE 1 TABLET (15 MG TOTAL) BY MOUTH DAILY.   sertraline  (ZOLOFT ) 50 MG tablet Take 1.5 tablets (75 mg total) by mouth daily.   traZODone (DESYREL) 100 MG tablet Take 100 mg by mouth as needed for sleep.   valsartan  (DIOVAN ) 160 MG tablet Take 1 tablet (160 mg total) by mouth daily.    Allergies: Patient has no known allergies. Family History: Patient family history includes Alcohol abuse in her daughter; Asthma in her mother; Depression in her daughter and mother; Heart disease in her father; Hyperlipidemia in her father; Lupus in her mother; Parkinson's disease in her mother; Stroke in her father. Social History:  Patient  reports that she has never smoked. She has never used smokeless tobacco. She reports current alcohol use of about 2.0 standard drinks of alcohol per week. She reports that she does not use  drugs.  Review of Systems: Constitutional: Negative for fever malaise or anorexia Cardiovascular: negative for chest pain Respiratory: negative for SOB or persistent cough Gastrointestinal: negative for abdominal pain  Objective  Vitals: BP (!) 145/78   Pulse 78   Temp 98.1 F (36.7 C)   Ht 5' 3.5" (1.613 m)   Wt 123 lb 3.2 oz (55.9 kg)   SpO2 96%   BMI 21.48 kg/m  General: no acute distress , A&Ox3 HEENT: PEERL, conjunctiva normal, neck is supple, no sinus ttp, cerumen w/o impaction bilateral ears Cardiovascular:  RRR without murmur or gallop.  Respiratory:  Good breath sounds bilaterally, CTAB with normal respiratory effort Skin:  Warm, no rashes  Lab Results  Component Value Date   ESRSEDRATE 4 10/31/2023   Lab Results  Component Value Date   WBC 4.0 10/31/2023   HGB 13.5 10/31/2023   HCT 40.5 10/31/2023   MCV 89.5 10/31/2023   PLT 209.0 10/31/2023   Lab Results  Component Value Date   NA 134 (L)  10/31/2023   CL 102 10/31/2023   K 4.1 10/31/2023   CO2 25 10/31/2023   BUN 14 10/31/2023   CREATININE 0.73 10/31/2023   GFR 77.24 10/31/2023   CALCIUM 9.1 10/31/2023   ALBUMIN 4.4 10/31/2023   GLUCOSE 98 10/31/2023     Commons side effects, risks, benefits, and alternatives for medications and treatment plan prescribed today were discussed, and the patient expressed understanding of the given instructions. Patient is instructed to call or message via MyChart if he/she has any questions or concerns regarding our treatment plan. No barriers to understanding were identified. We discussed Red Flag symptoms and signs in detail. Patient expressed understanding regarding what to do in case of urgent or emergency type symptoms.  Medication list was reconciled, printed and provided to the patient in AVS. Patient instructions and summary information was reviewed with the patient as documented in the AVS. This note was prepared with assistance of Dragon voice recognition  software. Occasional wrong-word or sound-a-like substitutions may have occurred due to the inherent limitations of voice recognition software

## 2023-12-16 NOTE — Patient Instructions (Signed)
 Please follow up as scheduled for your next visit with me: 05/08/2024   If you have any questions or concerns, please don't hesitate to send me a message via MyChart or call the office at [phone]. Thank you for visiting with us  today! It's our pleasure caring for you.    VISIT SUMMARY: During your visit, we discussed your persistent ear symptoms, dizziness, headaches, and slightly elevated blood pressure. We reviewed your current medications and symptoms, and we have developed a plan to address each of these issues.  YOUR PLAN: -HEARING ISSUES WITH BUZZING SENSATION: You have been experiencing a buzzing sensation and a feeling of fullness in your ears. This may be due to earwax buildup, which can affect your hearing aids. We will flush the earwax to improve the function of your hearing aids and refer you to an ENT specialist, Dr. Darlin Ehrlich, for further evaluation. If the ENT evaluation does not provide answers, we will refer you to a neurologist.  -CHRONIC SINUSITIS: Chronic sinusitis is a condition where the sinuses are inflamed for a long time, which can cause a feeling of fullness in the head and other symptoms. We suspect this might be contributing to your symptoms, so we have ordered a CT scan of your sinuses to get a clearer picture.  -HYPERTENSION: Hypertension, or high blood pressure, is when the force of the blood against your artery walls is too high. Your blood pressure has been slightly elevated, especially during physical activity. We will consider increasing the dose of your blood pressure medication to help maintain normal blood pressure levels during activity.  INSTRUCTIONS: Please follow up with the ENT specialist, Dr. Arley Bending, as soon as possible. Additionally, schedule a CT scan of your sinuses as ordered. Continue to monitor your blood pressure regularly and take your medication as prescribed. If you experience any new or worsening symptoms, please contact our  office.                      Contains text generated by [employer]bridge.                                 Contains text generated by [employer]bridge.

## 2023-12-30 ENCOUNTER — Ambulatory Visit
Admission: RE | Admit: 2023-12-30 | Discharge: 2023-12-30 | Disposition: A | Discharge status: Home / Self Care | Source: Ambulatory Visit | Attending: Family Medicine | Admitting: Family Medicine

## 2023-12-30 DIAGNOSIS — R5383 Other fatigue: Secondary | ICD-10-CM

## 2023-12-30 DIAGNOSIS — R42 Dizziness and giddiness: Secondary | ICD-10-CM

## 2024-01-08 ENCOUNTER — Encounter: Payer: Self-pay | Admitting: Family Medicine

## 2024-01-13 NOTE — Progress Notes (Unsigned)
 Cardiology Office Note   Date:  01/15/2024   ID:  Cheryl Austin Jan 15, 1941, MRN [id number]  PCP:  Cheryl Saha, MD  Cardiologist:   None Referring:  Cheryl Saha, MD  No chief complaint on file.     History of Present Illness: Cheryl Austin is a 83 y.o. female who presents for evaluation of hypertension.   She wore a monitor in 2023 for 3 days.  She had short infrequent runs of SVT.   She had a normal echo.  She otherwise has no past cardiac history.  She denies any cardiovascular testing.  She does not have chest pressure, neck or arm discomfort.  She has had no weight gain or edema.  She had her husband died of cancer a few years ago and then after that her daughter died in a car accident.  She has not moved from her house to an apartment.  She has a lot of life stressors and she really has not gotten back and exercise and she has been dealing with the move.  She has been noting that she has episodes where she feels like her head is full.  She takes her blood pressure and it might be in the 170s 180s systolic with the diastolic sometimes in the 110s 120s.  She gets anxious with this.  It comes out eventually.  Typically she says her systolics are in the 120s and diastolics in the 80s and that she has had 1 of these episodes.  She is nervous that she might have a stroke.  With her usual activities she denies symptoms as above and she is not having any new shortness of breath, PND or orthopnea.  She is not having any palpitations, presyncope or syncope.  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 Continue cal and vit d.    DEXA -1.2, 06/2013; stable from prior. Continue cal and vit d.     Osteoporosis    Polymyalgia rheumatica (HCC) 10/23/2019   Aug 2020, Dr. 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   By lumbar MRI 06/2019, mild   Thyroid  disease    Hypothyroid    Past Surgical History:  Procedure Laterality Date   APPENDECTOMY  1961   BREAST SURGERY Bilateral 1987 1998   Lumpectomy   EYE SURGERY     TOTAL ABDOMINAL HYSTERECTOMY  1986   BSO/Fibroids     Current Outpatient Medications  Medication Sig Dispense Refill   ibandronate  (BONIVA ) 150 MG tablet TAKE 1 TABLET (150 MG TOTAL) BY MOUTH EVERY 30 (THIRTY) DAYS. TAKE IN THE MORNING WITH A FULL GLASS OF WATER, ON AN EMPTY STOMACH, AND DO NOT TAKE ANYTHING ELSE BY MOUTH OR LIE DOWN FOR THE NEXT 30 MIN. 3 tablet 3   levothyroxine  (SYNTHROID ) 50 MCG tablet TAKE 1 TABLET BY MOUTH EVERY DAY 90 tablet 3   LORazepam  (ATIVAN ) 0.5 MG tablet Take 1-2 tablets by mouth daily as needed. 30 tablet  2   sertraline  (ZOLOFT ) 50 MG tablet Take 1.5 tablets (75 mg total) by mouth daily. 90 tablet 3   traZODone (DESYREL) 100 MG tablet Take 100 mg by mouth as needed for sleep.     valsartan  (DIOVAN ) 160 MG tablet Take 1 tablet (160 mg total) by mouth daily. 90 tablet 3   meloxicam  (MOBIC ) 15 MG tablet TAKE 1 TABLET (15 MG TOTAL) BY MOUTH DAILY. (Patient not taking: Reported on 01/15/2024) 30 tablet 2   No current facility-administered medications for this visit.    Allergies:   Patient has no known allergies.    Social History:  The patient  reports that she has never smoked. She has never used smokeless tobacco. She reports current alcohol use of about 2.0 standard drinks of alcohol per week. She reports that she does not use drugs.   Family History:  The patient's family history includes Alcohol abuse in her daughter;  Asthma in her mother; Depression in her daughter and mother; Heart disease in her father; Hyperlipidemia in her father; Lupus in her mother; Parkinson's disease in her mother; Stroke in her father.    ROS:  Please see the history of present illness.   Otherwise, review of systems are positive for none.   All other systems are reviewed and negative.    PHYSICAL EXAM: VS:  BP 128/76 (BP Location: Left Arm, Patient Position: Sitting)   Pulse 79   Ht 5\' 3"  (1.6 m)   Wt 124 lb 3.2 oz (56.3 kg)   SpO2 98%   BMI 22.00 kg/m  , BMI Body mass index is 22 kg/m. GENERAL:  Well appearing HEENT:  Pupils equal round and reactive, fundi not visualized, oral mucosa unremarkable NECK:  No jugular venous distention, waveform within normal limits, carotid upstroke brisk and symmetric, no bruits, no thyromegaly LYMPHATICS:  No cervical, inguinal adenopathy LUNGS:  Clear to auscultation bilaterally BACK:  No CVA tenderness CHEST:  Unremarkable HEART:  PMI not displaced or sustained,S1 and S2 within normal limits, no S3, no S4, no clicks, no rubs, no murmurs ABD:  Flat, positive bowel sounds normal in frequency in pitch, no bruits, no rebound, no guarding, no midline pulsatile mass, no hepatomegaly, no splenomegaly EXT:  2 plus pulses throughout, no edema, no cyanosis no clubbing SKIN:  No rashes no nodules NEURO:  Cranial nerves II through XII grossly intact, motor grossly intact throughout PSYCH:  Cognitively intact, oriented to person place and time    EKG:  EKG Interpretation Date/Time:  Wednesday Jan 15 2024 09:51:41 EDT Ventricular Rate:  73 PR Interval:  224 QRS Duration:  68 QT Interval:  400 QTC Calculation: 440 R Axis:   26  Text Interpretation: Sinus rhythm with 1st degree A-V block Nonspecific ST and T wave abnormality Confirmed by Eilleen Grates ([id number]) on 01/15/2024 10:16:41 AM     Recent Labs: 10/31/2023: ALT 18; BUN 14; Creatinine, Ser 0.73; Hemoglobin 13.5; Platelets 209.0;  Potassium 4.1; Sodium 134; TSH 0.66    Lipid Panel    Component Value Date/Time   CHOL 192 10/31/2023 0927   TRIG 56.0 10/31/2023 0927   HDL 83.60 10/31/2023 0927   CHOLHDL 2 10/31/2023 0927   VLDL 11.2 10/31/2023 0927   LDLCALC 97 10/31/2023 0927   LDLCALC 96 04/27/2020 1046      Wt Readings from Last 3 Encounters:  01/15/24 124 lb 3.2 oz (56.3 kg)  12/16/23 123 lb 3.2 oz (55.9 kg)  12/12/23 122 lb 6 oz (55.5 kg)  Other studies Reviewed: Additional studies/ records that were reviewed today include: Labs. Review of the above records demonstrates:  Please see elsewhere in the note.     ASSESSMENT AND PLAN:  Hypertension: Her blood pressure is spiking but typically well-controlled.  I think the best way to manage this will be with as needed hydralazine and she was given instructions about taking medicine for systolic blood pressure greater than 170 repeated and verified.  She can take this up to 3 times a day and let us  know if she is having to use this frequently.  Otherwise she will remain on the meds as listed.  I do see that her blood work is up-to-date including TSH and electrolytes.  She will continue to keep a blood pressure diary.  Stress/anxiety: She is on therapy and I discussed the need to continue with this and seeing her therapist.  We also discussed the role of exercise.  She will get back into this.   Current medicines are reviewed at length with the patient today.  The patient does not have concerns regarding medicines.  The following changes have been made:    Labs/ tests ordered today include: As above  Orders Placed This Encounter  Procedures   EKG 12-Lead     Disposition:   FU with with HTN Clinic Pharm D in about 3 months.     Signed, Eilleen Grates, MD  01/15/2024 10:21 AM     [facility]

## 2024-01-15 ENCOUNTER — Ambulatory Visit: Attending: Cardiology | Admitting: Cardiology

## 2024-01-15 ENCOUNTER — Encounter: Payer: Self-pay | Admitting: Cardiology

## 2024-01-15 ENCOUNTER — Other Ambulatory Visit (HOSPITAL_COMMUNITY): Payer: Self-pay

## 2024-01-15 VITALS — BP 128/76 | HR 79 | Ht 63.0 in | Wt 124.2 lb

## 2024-01-15 DIAGNOSIS — I10 Essential (primary) hypertension: Secondary | ICD-10-CM | POA: Diagnosis not present

## 2024-01-15 MED ORDER — HYDRALAZINE HCL 10 MG PO TABS
10.0000 mg | ORAL_TABLET | Freq: Three times a day (TID) | ORAL | 3 refills | Status: AC | PRN
  Filled 2024-01-15: qty 30, 10d supply, fill #0

## 2024-01-15 NOTE — Patient Instructions (Addendum)
 Medication Instructions:  Your physician has recommended you make the following change in your medication:   -Start hydralazine  (apresoline ) 10mg  as needed every 8 hours for systolic (top number) greater than 170.  *If you need a refill on your cardiac medications before your next appointment, please call your pharmacy*  Follow-Up: At [facility], you and your health needs are our priority.  As part of our continuing mission to provide you with exceptional heart care, our providers are all part of one team.  This team includes your primary Cardiologist (physician) and Advanced Practice Providers or APPs (Physician Assistants and Nurse Practitioners) who all work together to provide you with the care you need, when you need it.  Your next appointment:   To be determined   Provider:   Dr. Lavonne Prairie    We recommend signing up for the patient portal called "MyChart".  Sign up information is provided on this After Visit Summary.  MyChart is used to connect with patients for Virtual Visits (Telemedicine).  Patients are able to view lab/test results, encounter notes, upcoming appointments, etc.  Non-urgent messages can be sent to your provider as well.   To learn more about what you can do with MyChart, go to [email or web address].   Other Instructions Dr. Lavonne Prairie has requested that you schedule an appointment with one of our clinical pharmacists for a blood pressure check appointment within the next 7-8 weeks.  If you monitor your blood pressure (BP) at home, please bring your BP cuff and your BP readings with you to this appointment  HOW TO TAKE YOUR BLOOD PRESSURE: Rest 5 minutes before taking your blood pressure. Don't smoke or drink caffeinated beverages for at least 30 minutes before. Take your blood pressure before (not after) you eat. Sit comfortably with your back supported and both feet on the floor (don't cross your legs). Elevate your arm to heart level on a table or a  desk. Use the proper sized cuff. It should fit smoothly and snugly around your bare upper arm. There should be enough room to slip a fingertip under the cuff. The bottom edge of the cuff should be 1 inch above the crease of the elbow. Ideally, take 3 measurements at one sitting and record the average.

## 2024-01-22 ENCOUNTER — Ambulatory Visit: Payer: Self-pay | Admitting: Family Medicine

## 2024-01-22 NOTE — Progress Notes (Signed)
 See mychart note Dear Ms. Renaud, Your sinus CT shows that you do not have chronic sinusitis. Your sinuses are clear.  Sincerely, Dr. Jonelle Neri

## 2024-02-06 ENCOUNTER — Ambulatory Visit (INDEPENDENT_AMBULATORY_CARE_PROVIDER_SITE_OTHER): Admitting: Otolaryngology

## 2024-02-06 ENCOUNTER — Encounter (INDEPENDENT_AMBULATORY_CARE_PROVIDER_SITE_OTHER): Payer: Self-pay | Admitting: Otolaryngology

## 2024-02-06 VITALS — BP 137/83 | HR 102 | Ht 63.5 in | Wt 120.0 lb

## 2024-02-06 DIAGNOSIS — R040 Epistaxis: Secondary | ICD-10-CM

## 2024-02-06 DIAGNOSIS — H903 Sensorineural hearing loss, bilateral: Secondary | ICD-10-CM | POA: Diagnosis not present

## 2024-02-06 DIAGNOSIS — H6123 Impacted cerumen, bilateral: Secondary | ICD-10-CM | POA: Diagnosis not present

## 2024-02-08 DIAGNOSIS — H903 Sensorineural hearing loss, bilateral: Secondary | ICD-10-CM | POA: Insufficient documentation

## 2024-02-08 DIAGNOSIS — R040 Epistaxis: Secondary | ICD-10-CM | POA: Insufficient documentation

## 2024-02-08 DIAGNOSIS — H6123 Impacted cerumen, bilateral: Secondary | ICD-10-CM | POA: Insufficient documentation

## 2024-02-08 NOTE — Progress Notes (Signed)
 Patient ID: Cheryl Austin, female   DOB: Dec 29, 1940, 83 y.o.   MRN: [id number]  Cc: Hearing loss, recurrent epistaxis, ear pain  HPI: The patient is an 83 year old female who presents today complaining of recurrent right epistaxis and bilateral hearing loss.  The patient was previously noted to have bilateral high-frequency sensorineural hearing loss.  She was previously fitted with bilateral hearing aids at [employer].  However, she did not like the hearing aids.  Over the past few months, she has also noted intermittent ear pain and recurrent right-sided epistaxis.  She has had weekly right-sided epistaxis for the past 4 months.  The bleeding is both anterior and posterior.  She is not on any blood thinner.  She denies any recent nasal trauma.  Exam: General: Communicates without difficulty, well nourished, no acute distress. Head: Normocephalic, no evidence injury, no tenderness, facial buttresses intact without stepoff. Face/sinus: No tenderness to palpation and percussion. Facial movement is normal and symmetric. Eyes: PERRL, EOMI. No scleral icterus, conjunctivae clear. Neuro: CN II exam reveals vision grossly intact.  No nystagmus at any point of gaze. Ears: Auricles well formed without lesions.  Ear canals are intact without mass or lesion.  No erythema or edema is appreciated.  The TMs are intact without fluid. Nose: External evaluation reveals normal support and skin without lesions.  Dorsum is intact.  Anterior rhinoscopy reveals congested mucosa over anterior aspect of inferior turbinates and intact septum.  No purulence noted. Oral:  Oral cavity and oropharynx are intact, symmetric, without erythema or edema.  Mucosa is moist without lesions. Neck: Full range of motion without pain.  There is no significant lymphadenopathy.  No masses palpable.  Thyroid  bed within normal limits to palpation.  Parotid glands and submandibular glands equal bilaterally without mass.  Trachea is midline. Neuro:  CN  2-12 grossly intact.   Procedure:  Endoscopic control of recurrent right epistaxis. Indication:  Recurrent epistaxis  Description:  The right nasal cavity is sprayed with topical xylocaine and neo-synephrine.  After adequate anesthesia is achieved, the nasal cavity is examined with a 0 rigid endoscope.  A suction catheter is inserted in parallel with the 0 endoscope, and it is used to suction blood clots from the nasal cavity.  Several hypervascular areas are noted on the anterior and superior portion of the septum. Active bleeding is noted. A silver  nitrate stick is inserted in parallel with the 0 endoscope.  It is used to repeatedly cauterized the hypervascular areas.  Good hemostasis is achieved.  The patient tolerated the procedure well.    Procedure: Bilateral cerumen disimpaction Anesthesia: None Description: Under the operating microscope, the cerumen is carefully removed with a combination of cerumen currette, alligator forceps, and suction catheters.  After the cerumen is removed, the TMs are noted to be normal.  No mass, erythema, or lesions. The patient tolerated the procedure well.    Assessment: 1.  Bilateral cerumen impaction. 2.  After the cerumen removal procedure, both tympanic membranes and middle ear spaces are noted to be normal. 3.  Recurrent right epistaxis.  Hypervascular areas are noted on the right anterior and superior nasal septum. 4.  Bilateral high-frequency sensorineural hearing loss.  Plan: 1.  Otomicroscopy with bilateral cerumen disimpaction. 2.  Endoscopic cauterization of the right nasal septum. 3.  The physical exam and nasal endoscopy findings are reviewed with the patient. 4.  The patient is a candidate for hearing amplification.  Hearing aid options are discussed. 5.  Humidifier and nasal ointment as  needed.   6.  The patient will return for reevaluation in 1 month.

## 2024-03-04 ENCOUNTER — Encounter: Payer: Self-pay | Admitting: Pharmacist Clinician (PhC)/ Clinical Pharmacy Specialist

## 2024-03-04 ENCOUNTER — Ambulatory Visit: Attending: Cardiology | Admitting: Pharmacist Clinician (PhC)/ Clinical Pharmacy Specialist

## 2024-03-04 VITALS — BP 146/82 | HR 64

## 2024-03-04 DIAGNOSIS — I10 Essential (primary) hypertension: Secondary | ICD-10-CM | POA: Diagnosis not present

## 2024-03-04 NOTE — Patient Instructions (Addendum)
 Follow up appointment: Wednesday September 3 at 9:30 am  Take your BP meds as follows:  continue with valsartan  160 mg once daily in the evenings  Check your blood pressure at home 3 times per week and keep record of the readings.  Your blood pressure goal is < 130/80  To check your pressure at home you will need to:  1. Sit up in a chair, with feet flat on the floor and back supported. Do not cross your ankles or legs. 2. Rest your left arm so that the cuff is about heart level. If the cuff goes on your upper arm,  then just relax the arm on the table, arm of the chair or your lap. If you have a wrist cuff, we  suggest relaxing your wrist against your chest (think of it as Pledging the Flag with the  wrong arm).  3. Place the cuff snugly around your arm, about 1 inch above the crook of your elbow. The  cords should be inside the groove of your elbow.  4. Sit quietly, with the cuff in place, for about 5 minutes. After that 5 minutes press the power  button to start a reading. 5. Do not talk or move while the reading is taking place.  6. Record your readings on a sheet of paper. Although most cuffs have a memory, it is often  easier to see a pattern developing when the numbers are all in front of you.  7. You can repeat the reading after 1-3 minutes if it is recommended  Make sure your bladder is empty and you have not had caffeine or tobacco within the last 30 min  Always bring your blood pressure log with you to your appointments. If you have not brought your monitor in to be double checked for accuracy, please bring it to your next appointment.  You can find a list of quality blood pressure cuffs at [email or web address]  Important lifestyle changes to control high blood pressure  Intervention  Effect on the BP  Lose extra pounds and watch your waistline Weight loss is one of the most effective lifestyle changes for controlling blood pressure. If you're overweight or obese, losing even a  small amount of weight can help reduce blood pressure. Blood pressure might go down by about 1 millimeter of mercury (mm Hg) with each kilogram (about 2.2 pounds) of weight lost.  Exercise regularly As a general goal, aim for at least 30 minutes of moderate physical activity every day. Regular physical activity can lower high blood pressure by about 5 to 8 mm Hg.  Eat a healthy diet Eating a diet rich in whole grains, fruits, vegetables, and low-fat dairy products and low in saturated fat and cholesterol. A healthy diet can lower high blood pressure by up to 11 mm Hg.  Reduce salt (sodium) in your diet Even a small reduction of sodium in the diet can improve heart health and reduce high blood pressure by about 5 to 6 mm Hg.  Limit alcohol One drink equals 12 ounces of beer, 5 ounces of wine, or 1.5 ounces of 80-proof liquor.  Limiting alcohol to less than one drink a day for women or two drinks a day for men can help lower blood pressure by about 4 mm Hg.   If you have any questions or concerns please use My Chart to send questions or call the office at [phone]

## 2024-03-04 NOTE — Progress Notes (Signed)
 Office Visit    Patient Name: Cheryl Austin Date of Encounter: 03/04/2024  Primary Care Provider:  Jodie Lavern CROME, MD Primary Cardiologist:  None  Chief Complaint    Hypertension  Significant Past Medical History   anxiety BP spikes at home 2/2 anxiety; working with therapist; has lorazepam  0.5 prn  hypothyroid 3/25 TSH WNL; on levothyroxine  50 mcg    No Known Allergies  History of Present Illness    Cheryl Austin is a 83 y.o. female patient of Dr Lavona, in the office today for hypertension evaluation.   Ms Lamere has no cardiac history other than spikes of blood pressure as high as 180/120.  She notes this makes her anxious that she could have a stroke.  Office BP readings have been mostly normal, with occasional elevations.  When he saw her in May, Dr. Lavona gave her some hydralazine  10 mg, to take as needed for systolic pressure > 170.    Today she is in the office for follow up.  States that her pressure was good for most of her life, but started spiking up in the past few years.  Seeing mostly 140-150's/80-90 at home, although occasionally will go down to normal readings after she sits for awhile.  She also complains of a cough, but does not describe an ACEI cough, so doubtful related to valsartan .    Blood Pressure Goal:  130/80  Current Medications:  valsartan  160 mg every day - pm, hydralazine  10 mg prn SBP > 170 (max 3/day)  Family Hx:   father had valvualr disease, ICD in his 38's, lived to 33; mother parkinson's; siblings without htn (2 brothers)  Social Hx:      Tobacco: no  Alcohol: rarely  Caffeine: no soda, 3 cups of coffee in the morning - Folgers  Diet:    cooks at home from scratch; protein is yogurt, not much meat, more beans and nuts; ; vegetables frozen; doesn't snack  Exercise: tries, but not consistent; moving has caused to be issues; gym membership  Home BP readings:  some readings, believes average to be about 145/85.  Home cuff is  older wrist model, not being used correctly (not at heart level)   Accessory Clinical Findings    Lab Results  Component Value Date   CREATININE 0.73 10/31/2023   BUN 14 10/31/2023   NA 134 (L) 10/31/2023   K 4.1 10/31/2023   CL 102 10/31/2023   CO2 25 10/31/2023   Lab Results  Component Value Date   ALT 18 10/31/2023   AST 23 10/31/2023   ALKPHOS 67 10/31/2023   BILITOT 0.5 10/31/2023   Lab Results  Component Value Date   HGBA1C 5.6 10/04/2022    Home Medications    Current Outpatient Medications  Medication Sig Dispense Refill   hydrALAZINE  (APRESOLINE ) 10 MG tablet Take 1 tablet (10 mg total) by mouth every 8 (eight) hours as needed (for systolic blood pressure greater than 170.). 30 tablet 3   ibandronate  (BONIVA ) 150 MG tablet TAKE 1 TABLET (150 MG TOTAL) BY MOUTH EVERY 30 (THIRTY) DAYS. TAKE IN THE MORNING WITH A FULL GLASS OF WATER, ON AN EMPTY STOMACH, AND DO NOT TAKE ANYTHING ELSE BY MOUTH OR LIE DOWN FOR THE NEXT 30 MIN. 3 tablet 3   levothyroxine  (SYNTHROID ) 50 MCG tablet TAKE 1 TABLET BY MOUTH EVERY DAY 90 tablet 3   LORazepam  (ATIVAN ) 0.5 MG tablet Take 1-2 tablets by mouth daily as needed. 30 tablet 2  meloxicam  (MOBIC ) 15 MG tablet TAKE 1 TABLET (15 MG TOTAL) BY MOUTH DAILY. (Patient taking differently: Take 15 mg by mouth daily as needed for pain.) 30 tablet 2   sertraline  (ZOLOFT ) 50 MG tablet Take 1.5 tablets (75 mg total) by mouth daily. (Patient taking differently: Take 50 mg by mouth daily.) 90 tablet 3   traZODone (DESYREL) 100 MG tablet Take 100 mg by mouth as needed for sleep.     valsartan  (DIOVAN ) 160 MG tablet Take 1 tablet (160 mg total) by mouth daily. 90 tablet 3   No current facility-administered medications for this visit.     HYPERTENSION CONTROL Vitals:   03/04/24 0912 03/04/24 0915  BP: (!) 142/82 (!) 146/82    The patient's blood pressure is elevated above target today.  In order to address the patient's elevated BP: Blood  pressure will be monitored at home to determine if medication changes need to be made.    Assessment & Plan    Essential hypertension Assessment: BP is uncontrolled in office BP 142/82 mmHg;  above the goal (<130/80). Home monitoring technique is not correct Tolerates valsartan  well, without any side effects Denies SOB, palpitation, chest pain, headaches,or swelling Reiterated the importance of regular exercise and low salt diet   Plan:  Continue taking valsartan  160 mg daily Patient to keep record of BP readings with heart rate and report to us  at the next visit - proper technique taught in office Patient to follow up with me in 6 weeks  Labs ordered today: none    Allean Mink PharmD CPP [facility]  [address] [place], [place] [address] [phone]

## 2024-03-04 NOTE — Assessment & Plan Note (Signed)
 Assessment: BP is uncontrolled in office BP 142/82 mmHg;  above the goal (<130/80). Home monitoring technique is not correct Tolerates valsartan  well, without any side effects Denies SOB, palpitation, chest pain, headaches,or swelling Reiterated the importance of regular exercise and low salt diet   Plan:  Continue taking valsartan  160 mg daily Patient to keep record of BP readings with heart rate and report to us  at the next visit - proper technique taught in office Patient to follow up with me in 6 weeks  Labs ordered today: none

## 2024-03-11 ENCOUNTER — Ambulatory Visit (INDEPENDENT_AMBULATORY_CARE_PROVIDER_SITE_OTHER): Admitting: Otolaryngology

## 2024-03-23 ENCOUNTER — Ambulatory Visit: Admitting: Family Medicine

## 2024-03-23 ENCOUNTER — Encounter: Payer: Self-pay | Admitting: Family Medicine

## 2024-03-23 VITALS — BP 130/79 | HR 64 | Temp 97.7°F | Ht 63.5 in | Wt 121.2 lb

## 2024-03-23 DIAGNOSIS — R053 Chronic cough: Secondary | ICD-10-CM

## 2024-03-23 LAB — POC COVID19 BINAXNOW: SARS Coronavirus 2 Ag: NEGATIVE

## 2024-03-23 MED ORDER — MELOXICAM 15 MG PO TABS: 15.0000 mg | ORAL_TABLET | Freq: Every day | ORAL | Status: DC | PRN

## 2024-03-23 MED ORDER — OMEPRAZOLE 20 MG PO CPDR: 20.0000 mg | DELAYED_RELEASE_CAPSULE | Freq: Every day | ORAL | 3 refills | Status: AC

## 2024-03-23 MED ORDER — SERTRALINE HCL 50 MG PO TABS: 50.0000 mg | ORAL_TABLET | Freq: Every day | ORAL | Status: AC

## 2024-03-23 NOTE — Progress Notes (Signed)
 Subjective  CC:  Chief Complaint  Patient presents with   Cough    Pt stated that she has had a cough for the past 2 mos     HPI: Cheryl Austin is a 83 y.o. female who presents to the office today to address the problems listed above in the chief complaint. Discussed the use of AI scribe software for clinical note transcription with the patient, who gave verbal consent to proceed.  History of Present Illness Cheryl Austin is an 83 year old female who presents with a chronic cough.  She has been experiencing a persistent cough that occurs unexpectedly. The cough has a history of improvement with reflux and allergy medications, but she is not currently taking these medications. Drinking cold water and sometimes eating can trigger the cough. No reflux symptoms such as heartburn or pain are present.  No active allergy symptoms, including drainage, congestion, throat clearing, or sneezing. She is not producing any sputum and does not experience shortness of breath. The cough is bothersome in social situations as it draws attention and concern from others.     Assessment  1. Chronic cough      Plan  Assessment and Plan Assessment & Plan Chronic cough likely related to gastroesophageal reflux disease (GERD) Chronic cough likely due to GERD, with triggers including cold water and eating. Previous treatment with omeprazole  and allergy medications was effective. Silent reflux considered as a differential diagnosis. - Prescribed omeprazole  once daily. - Advised to start Flonase  or Zyrtec if drainage or allergy symptoms develop.    Follow up: prn Orders Placed This Encounter  Procedures   POC COVID-19   Meds ordered this encounter  Medications   meloxicam  (MOBIC ) 15 MG tablet    Sig: Take 1 tablet (15 mg total) by mouth daily as needed for pain.   sertraline  (ZOLOFT ) 50 MG tablet    Sig: Take 1 tablet (50 mg total) by mouth daily.   omeprazole  (PRILOSEC) 20 MG  capsule    Sig: Take 1 capsule (20 mg total) by mouth daily.    Dispense:  90 capsule    Refill:  3     I reviewed the patients updated PMH, FH, and SocHx.  Patient Active Problem List   Diagnosis Date Noted   Essential hypertension 10/04/2023    Priority: High   Paroxysmal SVT (supraventricular tachycardia) (HCC) 01/11/2022    Priority: High   Polymyalgia rheumatica (HCC) 10/23/2019    Priority: High   Seborrheic dermatitis 11/02/2014    Priority: High   Moderate episode of recurrent major depressive disorder (HCC) 06/22/2013    Priority: High   MRSA cellulitis 01/30/2013    Priority: High   Hypothyroidism 12/18/2010    Priority: High   Tinnitus of both ears 12/26/2022    Priority: Medium    Chronic cough 05/02/2022    Priority: Medium    GAD (generalized anxiety disorder) 10/26/2020    Priority: Medium    MRSA nasal colonization 10/26/2020    Priority: Medium    Spinal stenosis, lumbar region without neurogenic claudication 10/23/2019    Priority: Medium    Lumbar facet arthropathy 10/23/2019    Priority: Medium    Spondylosis of cervical region without myelopathy or radiculopathy 05/29/2018    Priority: Medium    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    Priority: Medium    Dermatitis, atopic 01/30/2013    Priority: Medium    Osteoarthritis, 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   ibandronate   (BONIVA ) 150 MG tablet TAKE 1 TABLET (150 MG TOTAL) BY MOUTH EVERY 30 (THIRTY) DAYS. TAKE IN THE MORNING WITH A FULL GLASS OF WATER, ON AN EMPTY STOMACH, AND DO NOT TAKE ANYTHING ELSE BY MOUTH OR LIE DOWN FOR THE NEXT 30 MIN.   levothyroxine  (SYNTHROID ) 50 MCG tablet TAKE 1 TABLET BY MOUTH EVERY DAY   LORazepam  (ATIVAN ) 0.5 MG tablet Take 1-2 tablets by mouth daily as needed.   omeprazole  (PRILOSEC) 20 MG capsule Take 1 capsule (20 mg total) by mouth daily.   traZODone (DESYREL) 100 MG tablet Take 100 mg by mouth as needed for sleep.   valsartan  (DIOVAN ) 160 MG tablet Take 1 tablet (160 mg total) by mouth daily.   [DISCONTINUED] meloxicam  (MOBIC ) 15 MG tablet TAKE 1 TABLET (15 MG TOTAL) BY MOUTH DAILY. (Patient taking differently: Take 15 mg by mouth daily as needed for pain.)   [DISCONTINUED] sertraline  (ZOLOFT ) 50 MG tablet Take 1.5 tablets (75 mg total) by mouth daily. (Patient taking differently: Take 50 mg by mouth daily.)   Allergies: Patient has no known allergies. Family History: Patient family history includes Alcohol abuse in her daughter; Asthma in her mother; Depression in her daughter and mother; Heart disease in her father; Hyperlipidemia in her father; Lupus in her mother; Parkinson's disease in her mother; Stroke in her father. Social History:  Patient  reports that she has never smoked. She has never used smokeless tobacco. She reports current alcohol use of about 2.0 standard drinks of alcohol per week. She reports that she does not use drugs.  Review of Systems: Constitutional: Negative for fever malaise or anorexia Cardiovascular: negative for chest pain Respiratory: negative for SOB or persistent cough Gastrointestinal: negative for abdominal pain  Objective  Vitals: BP 130/79   Pulse 64   Temp 97.7 F (36.5 C)   Ht 5' 3.5 (1.613 m)   Wt 121 lb 3.2 oz (55 kg)   SpO2 98%   BMI 21.13 kg/m  General: no acute distress , A&Ox3 HEENT: PEERL, conjunctiva normal, neck  is supple Cardiovascular:  RRR without murmur or gallop.  Respiratory:  Good breath sounds bilaterally, CTAB with normal respiratory effort Skin:  Warm, no rashes Commons side effects, risks, benefits, and alternatives for medications and treatment plan prescribed today were discussed, and the patient expressed understanding of the given instructions. Patient is instructed to call or message via MyChart if he/she has any questions or concerns regarding our treatment plan. No barriers to understanding were identified. We discussed Red Flag symptoms and signs in detail. Patient expressed understanding regarding what to do in case of urgent or emergency type symptoms.  Medication list was reconciled, printed and provided to the patient in AVS. Patient instructions and summary information was reviewed with the patient as documented in the AVS. This note was prepared with assistance of Dragon voice  recognition software. Occasional wrong-word or sound-a-like substitutions may have occurred due to the inherent limitations of voice recognition software

## 2024-04-02 ENCOUNTER — Ambulatory Visit (INDEPENDENT_AMBULATORY_CARE_PROVIDER_SITE_OTHER): Admitting: Otolaryngology

## 2024-04-22 ENCOUNTER — Other Ambulatory Visit: Payer: Self-pay | Admitting: Family Medicine

## 2024-04-22 ENCOUNTER — Ambulatory Visit: Admitting: Pharmacist Clinician (PhC)/ Clinical Pharmacy Specialist

## 2024-04-29 ENCOUNTER — Ambulatory Visit (INDEPENDENT_AMBULATORY_CARE_PROVIDER_SITE_OTHER): Admitting: Otolaryngology

## 2024-04-29 VITALS — BP 137/77 | HR 74

## 2024-04-29 DIAGNOSIS — R040 Epistaxis: Secondary | ICD-10-CM

## 2024-04-29 DIAGNOSIS — H903 Sensorineural hearing loss, bilateral: Secondary | ICD-10-CM | POA: Diagnosis not present

## 2024-05-01 NOTE — Progress Notes (Signed)
 Patient ID: Cheryl Austin, female   DOB: 11-16-1940, 83 y.o.   MRN: [id number]  Follow-up: Recurrent epistaxis, hearing loss  HPI: The patient is an 83 year old female who returns today for her follow-up evaluation.  She was last seen 1 month ago.  At that time, she was complaining of recurrent right epistaxis.  Hypervascular areas were noted on the right anterior and superior nasal septum.  She was treated with cauterization of the right nasal septum.  The patient also has a history of bilateral high-frequency sensorineural hearing loss.  She was previously fitted with bilateral hearing aids.  The patient returns today reporting no significant bleeding since her last visit.  She is able to breathe through both nostrils.  She denies any recent change in her hearing.  Exam: General: Communicates without difficulty, well nourished, no acute distress. Head: Normocephalic, no evidence injury, no tenderness, facial buttresses intact without stepoff. Face/sinus: No tenderness to palpation and percussion. Facial movement is normal and symmetric. Eyes: PERRL, EOMI. No scleral icterus, conjunctivae clear. Neuro: CN II exam reveals vision grossly intact.  No nystagmus at any point of gaze. Ears: Auricles well formed without lesions.  Ear canals are intact without mass or lesion.  No erythema or edema is appreciated.  The TMs are intact without fluid. Nose: External evaluation reveals normal support and skin without lesions.  Dorsum is intact.  Anterior rhinoscopy reveals congested mucosa over anterior aspect of inferior turbinates and intact septum.  No bleeding or hypervascular area is noted. Oral:  Oral cavity and oropharynx are intact, symmetric, without erythema or edema.  Mucosa is moist without lesions. Neck: Full range of motion without pain.  There is no significant lymphadenopathy.  No masses palpable.  Thyroid  bed within normal limits to palpation.  Parotid glands and submandibular glands equal bilaterally  without mass.  Trachea is midline. Neuro:  CN 2-12 grossly intact.   Assessment: 1.  The patient's recurrent epistaxis is currently under control after the cauterization procedure.  No bleeding or hypervascular area is noted today. 2.  Bilateral high-frequency sensorineural hearing loss, secondary to presbycusis.  Plan: 1.  The physical exam findings are reviewed with the patient. 2.  Nasal ointment and humidifier as needed. 3.  Continue the use of her hearing aids. 4.  The patient is encouraged to call with any questions or concerns.

## 2024-05-05 ENCOUNTER — Ambulatory Visit: Admitting: Family Medicine

## 2024-05-08 ENCOUNTER — Ambulatory Visit: Admitting: Family Medicine

## 2024-05-11 ENCOUNTER — Ambulatory Visit: Admitting: Family Medicine

## 2024-05-11 ENCOUNTER — Encounter: Payer: Self-pay | Admitting: Family Medicine

## 2024-05-11 VITALS — BP 148/80 | HR 66 | Temp 97.7°F | Ht 63.5 in | Wt 120.0 lb

## 2024-05-11 DIAGNOSIS — F411 Generalized anxiety disorder: Secondary | ICD-10-CM | POA: Diagnosis not present

## 2024-05-11 DIAGNOSIS — M47816 Spondylosis without myelopathy or radiculopathy, lumbar region: Secondary | ICD-10-CM | POA: Diagnosis not present

## 2024-05-11 DIAGNOSIS — I10 Essential (primary) hypertension: Secondary | ICD-10-CM

## 2024-05-11 MED ORDER — MELOXICAM 15 MG PO TABS: 15.0000 mg | ORAL_TABLET | Freq: Every day | ORAL | Status: DC | PRN

## 2024-05-12 NOTE — Progress Notes (Signed)
 Subjective  CC:  Chief Complaint  Patient presents with   Hypertension    HPI: Cheryl Austin is a 83 y.o. female who presents to the office today to address the problems listed above in the chief complaint. Hypertension f/u:  Discussed the use of AI scribe software for clinical note transcription with the patient, who gave verbal consent to proceed.  History of Present Illness Cheryl Austin Cheryl Austin is an 83 year old female who presents for a six-month follow-up.  Her blood pressure has been mostly in the 130s over 70s or 80s at home, but it is elevated today, which she attributes to stress related to house business and paperwork. She has not needed to use hydralazine  recently and is currently taking valsartan .  She recently underwent Mohs surgery for skin cancer removal about a week ago. The site is painful, and she finds it difficult to keep it covered due to discomfort. She has been using Vaseline to keep it moist but cannot tolerate a bandage due to pain.  She is experiencing significant stress, describing her head as 'like a drum' and feeling spaced out over the last couple of weeks due to handling house-related matters on her own. She acknowledges taking on more than she should have and is feeling the pressure to complete everything by the end of the week.  She requests a refill of meloxicam  for her back pain, noting that she is out of her current supply and the remaining pills are old.   Assessment  1. Essential hypertension   2. Lumbar facet arthropathy   3. GAD (generalized anxiety disorder)      Plan  Assessment and Plan Assessment & Plan Postoperative wound of hand following Mohs surgery Postoperative wound is painful and slightly red. Not covered due to discomfort. - Apply Vaseline to maintain moisture. - Avoid covering if uncomfortable. - Prevent clothing from rubbing against wound.  Hypertension Blood pressure slightly elevated, likely stress-related. Home  readings mostly in 130s/70s-80s. No recent hydralazine  use. Managed with valsartan , dose increase considered if needed. - Monitor blood pressure at home, bring readings to pharmacist follow-up. - Consider valsartan  dose increase if blood pressure remains in 130s-140s/80s. - Send valsartan  prescription.  Chronic back pain Chronic back pain managed with meloxicam . Out of medication, requires refill. Meloxicam  may elevate blood pressure. - Send meloxicam  prescription. - Advise caution with meloxicam  due to potential blood pressure elevation.     Education regarding management of these chronic disease states was given. Management strategies discussed on successive visits include dietary and exercise recommendations, goals of achieving and maintaining IBW, and lifestyle modifications aiming for adequate sleep and minimizing stressors.  Follow up: 6 mo for cpe  No orders of the defined types were placed in this encounter.  Meds ordered this encounter  Medications   meloxicam  (MOBIC ) 15 MG tablet    Sig: Take 1 tablet (15 mg total) by mouth daily as needed for pain.      BP Readings from Last 3 Encounters:  05/11/24 (!) 148/80  04/29/24 137/77  03/23/24 130/79   Wt Readings from Last 3 Encounters:  05/11/24 120 lb (54.4 kg)  03/23/24 121 lb 3.2 oz (55 kg)  02/06/24 120 lb (54.4 kg)    Lab Results  Component Value Date   CHOL 192 10/31/2023   CHOL 202 (H) 10/04/2022   CHOL 196 09/13/2021   Lab Results  Component Value Date   HDL 83.60 10/31/2023   HDL 83.10 10/04/2022  HDL 87.20 09/13/2021   Lab Results  Component Value Date   LDLCALC 97 10/31/2023   LDLCALC 105 (H) 10/04/2022   LDLCALC 98 09/13/2021   Lab Results  Component Value Date   TRIG 56.0 10/31/2023   TRIG 69.0 10/04/2022   TRIG 52.0 09/13/2021   Lab Results  Component Value Date   CHOLHDL 2 10/31/2023   CHOLHDL 2 10/04/2022   CHOLHDL 2 09/13/2021   No results found for: LDLDIRECT Lab Results   Component Value Date   CREATININE 0.73 10/31/2023   BUN 14 10/31/2023   NA 134 (L) 10/31/2023   K 4.1 10/31/2023   CL 102 10/31/2023   CO2 25 10/31/2023    The ASCVD Risk score (Arnett DK, et al., 2019) failed to calculate for the following reasons:   The 2019 ASCVD risk score is only valid for ages 55 to 84  I reviewed the patients updated PMH, FH, and SocHx.    Patient Active Problem List   Diagnosis Date Noted   Essential hypertension 10/04/2023    Priority: High   Paroxysmal SVT (supraventricular tachycardia) 01/11/2022    Priority: High   Polymyalgia rheumatica 10/23/2019    Priority: High   Seborrheic dermatitis 11/02/2014    Priority: High   Moderate episode of recurrent major depressive disorder (HCC) 06/22/2013    Priority: High   MRSA cellulitis 01/30/2013    Priority: High   Hypothyroidism 12/18/2010    Priority: High   Tinnitus of both ears 12/26/2022    Priority: Medium    Chronic cough 05/02/2022    Priority: Medium    GAD (generalized anxiety disorder) 10/26/2020    Priority: Medium    MRSA nasal colonization 10/26/2020    Priority: Medium    Spinal stenosis, lumbar region without neurogenic claudication 10/23/2019    Priority: Medium    Lumbar facet arthropathy 10/23/2019    Priority: Medium    Spondylosis of cervical region without myelopathy or radiculopathy 05/29/2018    Priority: Medium    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  Social History: Patient  reports that she has never smoked. She has never used smokeless tobacco. She reports current alcohol use of about 2.0 standard drinks of alcohol per week. She reports that she does not use drugs.  Current Meds  Medication Sig   hydrALAZINE  (APRESOLINE ) 10 MG tablet Take 1 tablet (10 mg total) by mouth every 8 (eight) hours as needed (for systolic blood pressure greater than 170.).   ibandronate  (BONIVA ) 150 MG tablet TAKE 1 TABLET (150 MG TOTAL) BY MOUTH EVERY 30 (THIRTY) DAYS. TAKE IN THE MORNING WITH A FULL GLASS OF WATER, ON AN EMPTY STOMACH, AND DO NOT TAKE ANYTHING ELSE BY MOUTH OR LIE DOWN FOR THE NEXT 30 MIN.   levothyroxine  (SYNTHROID ) 50 MCG tablet TAKE 1 TABLET BY MOUTH EVERY DAY   LORazepam  (  ATIVAN ) 0.5 MG tablet Take 1-2 tablets by mouth daily as needed.   omeprazole  (PRILOSEC) 20 MG capsule Take 1 capsule (20 mg total) by mouth daily.   sertraline  (ZOLOFT ) 50 MG tablet Take 1 tablet (50 mg total) by mouth daily.   traZODone (DESYREL) 100 MG tablet Take 100 mg by mouth as needed for sleep.   valsartan  (DIOVAN ) 160 MG tablet Take 1 tablet (160 mg total) by mouth daily.   [DISCONTINUED] meloxicam  (MOBIC ) 15 MG tablet Take 1 tablet (15 mg total) by mouth daily as needed for pain.    Review of Systems: Cardiovascular: negative for chest pain, palpitations, leg swelling, orthopnea Respiratory: negative for SOB, wheezing or persistent cough Gastrointestinal: negative for abdominal pain Genitourinary: negative for dysuria or gross hematuria  Objective  Vitals: BP (!) 148/80   Pulse 66   Temp 97.7 F (36.5 C)   Ht 5' 3.5 (1.613 m)   Wt 120 lb (54.4 kg)   SpO2 98%   BMI 20.92 kg/m  General: no acute distress  Psych:  Alert and oriented, normal mood and affect HEENT:  Normocephalic, atraumatic, supple neck  Cardiovascular:  RRR without murmur. no edema Respiratory:  Good breath sounds bilaterally, CTAB  with normal respiratory effort Skin:  Warm, dorsal left hand with open wound Neurologic:   Mental status is normal Commons side effects, risks, benefits, and alternatives for medications and treatment plan prescribed today were discussed, and the patient expressed understanding of the given instructions. Patient is instructed to call or message via MyChart if he/she has any questions or concerns regarding our treatment plan. No barriers to understanding were identified. We discussed Red Flag symptoms and signs in detail. Patient expressed understanding regarding what to do in case of urgent or emergency type symptoms.  Medication list was reconciled, printed and provided to the patient in AVS. Patient instructions and summary information was reviewed with the patient as documented in the AVS. This note was prepared with assistance of Dragon voice recognition software. Occasional wrong-word or sound-a-like substitutions may have occurred due to the inherent limitation

## 2024-05-18 ENCOUNTER — Ambulatory Visit: Attending: Cardiology | Admitting: Pharmacist Clinician (PhC)/ Clinical Pharmacy Specialist

## 2024-05-18 ENCOUNTER — Encounter: Payer: Self-pay | Admitting: Pharmacist Clinician (PhC)/ Clinical Pharmacy Specialist

## 2024-05-18 VITALS — BP 130/80 | HR 76

## 2024-05-18 DIAGNOSIS — I10 Essential (primary) hypertension: Secondary | ICD-10-CM | POA: Diagnosis not present

## 2024-05-18 NOTE — Patient Instructions (Signed)
 Follow up appointment: reach out to me in about a month and let me know how your home readings are doing.    Take your BP meds as follows: continue with valsartan  160 mg daily   You can use hydralazine  10 mg for SBP > 160  Check your blood pressure at home daily (if able) and keep record of the readings.  Your blood pressure goal is <130/80  To check your pressure at home you will need to:  1. Sit up in a chair, with feet flat on the floor and back supported. Do not cross your ankles or legs. 2. Rest your left arm so that the cuff is about heart level. If the cuff goes on your upper arm,  then just relax the arm on the table, arm of the chair or your lap. If you have a wrist cuff, we  suggest relaxing your wrist against your chest (think of it as Pledging the Flag with the  wrong arm).  3. Place the cuff snugly around your arm, about 1 inch above the crook of your elbow. The  cords should be inside the groove of your elbow.  4. Sit quietly, with the cuff in place, for about 5 minutes. After that 5 minutes press the power  button to start a reading. 5. Do not talk or move while the reading is taking place.  6. Record your readings on a sheet of paper. Although most cuffs have a memory, it is often  easier to see a pattern developing when the numbers are all in front of you.  7. You can repeat the reading after 1-3 minutes if it is recommended  Make sure your bladder is empty and you have not had caffeine or tobacco within the last 30 min  Always bring your blood pressure log with you to your appointments. If you have not brought your monitor in to be double checked for accuracy, please bring it to your next appointment.  You can find a list of quality blood pressure cuffs at [email or web address]  Important lifestyle changes to control high blood pressure  Intervention  Effect on the BP  Lose extra pounds and watch your waistline Weight loss is one of the most effective lifestyle changes  for controlling blood pressure. If you're overweight or obese, losing even a small amount of weight can help reduce blood pressure. Blood pressure might go down by about 1 millimeter of mercury (mm Hg) with each kilogram (about 2.2 pounds) of weight lost.  Exercise regularly As a general goal, aim for at least 30 minutes of moderate physical activity every day. Regular physical activity can lower high blood pressure by about 5 to 8 mm Hg.  Eat a healthy diet Eating a diet rich in whole grains, fruits, vegetables, and low-fat dairy products and low in saturated fat and cholesterol. A healthy diet can lower high blood pressure by up to 11 mm Hg.  Reduce salt (sodium) in your diet Even a small reduction of sodium in the diet can improve heart health and reduce high blood pressure by about 5 to 6 mm Hg.  Limit alcohol One drink equals 12 ounces of beer, 5 ounces of wine, or 1.5 ounces of 80-proof liquor.  Limiting alcohol to less than one drink a day for women or two drinks a day for men can help lower blood pressure by about 4 mm Hg.   If you have any questions or concerns please use My Chart to send questions or  call the office at [phone]

## 2024-05-18 NOTE — Progress Notes (Signed)
 Office Visit    Patient Name: Cheryl Austin Date of Encounter: 05/18/2024  Primary Care Provider:  Jodie Lavern CROME, MD Primary Cardiologist:  None  Chief Complaint    Hypertension  Significant Past Medical History   anxiety BP spikes at home 2/2 anxiety; working with therapist; has lorazepam  0.5 prn  hypothyroid 3/25 TSH WNL; on levothyroxine  50 mcg    No Known Allergies  History of Present Illness    Cheryl Austin is a 83 y.o. female patient of Dr Lavona, in the office today for hypertension evaluation.   Cheryl Austin has no cardiac history other than spikes of blood pressure as high as 180/120.  She notes this makes her anxious that she could have a stroke.  Office BP readings have been mostly normal, with occasional elevations.  When he saw her in May, Dr. Lavona gave her some hydralazine  10 mg, to take as needed for systolic pressure > 170.  When I saw her in July she noted mostly 140-150/80-90 readings.  She also noted ongoing cough, but did not match description of an ACEI cough, so doubtful related to valsartan .  I showed her proper home monitoring technique, and asked that she record readings until follow up.  Today she returns to the office.  Unfortunately she has not been able to check home BP readings except a few times.  Her brother was injured and she was back and forth to help him in [place] , and she is also in the process of purchasing a new build home in the [place] area and was trying to get things finalized with that.  No concerns with her medication, and has not seen a pressure elevated enough to need hydralazine .    Blood Pressure Goal:  130/80  Current Medications:  valsartan  160 mg every day - pm, hydralazine  10 mg prn SBP > 170 (max 3/day)  Family Hx:   father had valvualr disease, ICD in his 22's, lived to 71; mother parkinson's; siblings without htn (2 brothers)  Social Hx:      Tobacco: no  Alcohol: rarely  Caffeine: no soda, 3 cups of coffee in  the morning - [employer]  Diet:    cooks at home from scratch; protein is yogurt, not much meat, more beans and nuts; ; vegetables frozen; doesn't snack  Exercise: tries, but not consistent; moving has caused to be issues; gym membership  Home BP readings:  home device about 81 years old wrist, [employer] brand.  She usually checks on right wrist, today we checked on both wrists, and both were within 10 points of office reading systolic.  Home cuff on the right was 15 points higher diastolic, but patient was talking as well.    Only has 3-4 days of readings with her, showing 120's in the mornings with afternoons reaching 135-140, then decreasing in the evenings again.  Not enough information to determine if afternoon highs are more than just stress of the day.     Accessory Clinical Findings    Lab Results  Component Value Date   CREATININE 0.73 10/31/2023   BUN 14 10/31/2023   NA 134 (L) 10/31/2023   K 4.1 10/31/2023   CL 102 10/31/2023   CO2 25 10/31/2023   Lab Results  Component Value Date   ALT 18 10/31/2023   AST 23 10/31/2023   ALKPHOS 67 10/31/2023   BILITOT 0.5 10/31/2023   Lab Results  Component Value Date   HGBA1C 5.6 10/04/2022  Home Medications    Current Outpatient Medications  Medication Sig Dispense Refill   hydrALAZINE  (APRESOLINE ) 10 MG tablet Take 1 tablet (10 mg total) by mouth every 8 (eight) hours as needed (for systolic blood pressure greater than 170.). 30 tablet 3   ibandronate  (BONIVA ) 150 MG tablet TAKE 1 TABLET (150 MG TOTAL) BY MOUTH EVERY 30 (THIRTY) DAYS. TAKE IN THE MORNING WITH A FULL GLASS OF WATER, ON AN EMPTY STOMACH, AND DO NOT TAKE ANYTHING ELSE BY MOUTH OR LIE DOWN FOR THE NEXT 30 MIN. 3 tablet 3   levothyroxine  (SYNTHROID ) 50 MCG tablet TAKE 1 TABLET BY MOUTH EVERY DAY 90 tablet 3   LORazepam  (ATIVAN ) 0.5 MG tablet Take 1-2 tablets by mouth daily as needed. 30 tablet 2   meloxicam  (MOBIC ) 15 MG tablet Take 1 tablet (15 mg total) by mouth daily as  needed for pain.     omeprazole  (PRILOSEC) 20 MG capsule Take 1 capsule (20 mg total) by mouth daily. 90 capsule 3   sertraline  (ZOLOFT ) 50 MG tablet Take 1 tablet (50 mg total) by mouth daily.     traZODone (DESYREL) 100 MG tablet Take 100 mg by mouth as needed for sleep.     valsartan  (DIOVAN ) 160 MG tablet Take 1 tablet (160 mg total) by mouth daily. 90 tablet 3   No current facility-administered medications for this visit.       Assessment & Plan    Essential hypertension Assessment: BP is uncontrolled in office BP 138/76 mmHg, dropped to 130/80 with third reading;  Tolerates valsartan  160 mg well, without any side effects Denies SOB, palpitation, chest pain, headaches,or swelling Has had increased stress in past 2 months, travelling to [place] and [place]  Reiterated the importance of regular exercise and low salt diet   Plan:  Continue taking valsartan  60 mg daily Continue with hydralazine  10 mg prn SBP > 160  Patient to keep record of BP readings with heart rate and report to us  at the next visit Patient to follow up with me via MyChart in 1 month.  Can follow up in office if needed after that.  Labs ordered today:  none   Allean Mink PharmD CPP [facility]  [address] [place], [place] [address] [phone]

## 2024-05-18 NOTE — Assessment & Plan Note (Signed)
 Assessment: BP is uncontrolled in office BP 138/76 mmHg, dropped to 130/80 with third reading;  Tolerates valsartan  160 mg well, without any side effects Denies SOB, palpitation, chest pain, headaches,or swelling Has had increased stress in past 2 months, travelling to [place] and [place]  Reiterated the importance of regular exercise and low salt diet   Plan:  Continue taking valsartan  60 mg daily Continue with hydralazine  10 mg prn SBP > 160  Patient to keep record of BP readings with heart rate and report to us  at the next visit Patient to follow up with me via MyChart in 1 month.  Can follow up in office if needed after that.  Labs ordered today:  none

## 2024-05-27 ENCOUNTER — Other Ambulatory Visit: Payer: Self-pay

## 2024-05-27 ENCOUNTER — Telehealth: Payer: Self-pay

## 2024-05-27 MED ORDER — MELOXICAM 15 MG PO TABS: 15.0000 mg | ORAL_TABLET | Freq: Every day | ORAL | 1 refills | Status: AC | PRN

## 2024-05-27 MED ORDER — MELOXICAM 15 MG PO TABS: 15.0000 mg | ORAL_TABLET | Freq: Every day | ORAL | Status: DC | PRN

## 2024-05-27 NOTE — Telephone Encounter (Signed)
 Copied from CRM [id number]. Topic: Clinical - Prescription Issue >> May 27, 2024  1:47 PM Franky GRADE wrote: Reason for CRM: Patient is calling because the pharmacy did not receive the prescription for meloxicam  (MOBIC ) 15 MG tablet [[id number]] that was placed on 05/11/2024. It needs to be sent to [id number] [place], [place] - [address]  Phone: [phone] Fax: [phone].

## 2024-05-27 NOTE — Telephone Encounter (Unsigned)
 Copied from CRM [id number]. Topic: Clinical - Prescription Issue >> May 27, 2024  1:47 PM Franky GRADE wrote: Reason for CRM: Patient is calling because the pharmacy did not receive the prescription for meloxicam  (MOBIC ) 15 MG tablet [[id number]] that was placed on 05/11/2024. It needs to be sent to [id number] [place], [place] - [address]  Phone: [phone] Fax: [phone].

## 2024-06-23 ENCOUNTER — Encounter: Payer: Self-pay | Admitting: Family Medicine
